# Patient Record
Sex: Male | Born: 1965 | Race: White | Hispanic: No | Marital: Married | Smoking: Former smoker
Health system: Southern US, Community
[De-identification: ages and names within clinical notes are randomized; demographics above are authoritative.]

## PROBLEM LIST (undated history)

## (undated) DIAGNOSIS — N200 Calculus of kidney: Secondary | ICD-10-CM

## (undated) DIAGNOSIS — T8859XA Other complications of anesthesia, initial encounter: Secondary | ICD-10-CM

## (undated) DIAGNOSIS — T4145XA Adverse effect of unspecified anesthetic, initial encounter: Secondary | ICD-10-CM

## (undated) DIAGNOSIS — S069XAA Unspecified intracranial injury with loss of consciousness status unknown, initial encounter: Secondary | ICD-10-CM

## (undated) DIAGNOSIS — M1990 Unspecified osteoarthritis, unspecified site: Secondary | ICD-10-CM

## (undated) DIAGNOSIS — S060X9A Concussion with loss of consciousness of unspecified duration, initial encounter: Secondary | ICD-10-CM

## (undated) DIAGNOSIS — F319 Bipolar disorder, unspecified: Secondary | ICD-10-CM

## (undated) DIAGNOSIS — F32A Depression, unspecified: Secondary | ICD-10-CM

## (undated) DIAGNOSIS — K759 Inflammatory liver disease, unspecified: Secondary | ICD-10-CM

## (undated) DIAGNOSIS — I10 Essential (primary) hypertension: Secondary | ICD-10-CM

## (undated) DIAGNOSIS — R51 Headache: Secondary | ICD-10-CM

## (undated) DIAGNOSIS — Z8489 Family history of other specified conditions: Secondary | ICD-10-CM

## (undated) DIAGNOSIS — F419 Anxiety disorder, unspecified: Secondary | ICD-10-CM

## (undated) DIAGNOSIS — Z9889 Other specified postprocedural states: Secondary | ICD-10-CM

## (undated) DIAGNOSIS — R55 Syncope and collapse: Secondary | ICD-10-CM

## (undated) DIAGNOSIS — S060XAA Concussion with loss of consciousness status unknown, initial encounter: Secondary | ICD-10-CM

## (undated) DIAGNOSIS — S069X9A Unspecified intracranial injury with loss of consciousness of unspecified duration, initial encounter: Secondary | ICD-10-CM

## (undated) DIAGNOSIS — R112 Nausea with vomiting, unspecified: Secondary | ICD-10-CM

## (undated) DIAGNOSIS — R519 Headache, unspecified: Secondary | ICD-10-CM

## (undated) DIAGNOSIS — F329 Major depressive disorder, single episode, unspecified: Secondary | ICD-10-CM

## (undated) DIAGNOSIS — Z87442 Personal history of urinary calculi: Secondary | ICD-10-CM

## (undated) HISTORY — DX: Unspecified intracranial injury with loss of consciousness status unknown, initial encounter: S06.9XAA

## (undated) HISTORY — DX: Bipolar disorder, unspecified: F31.9

## (undated) HISTORY — PX: SP ARTHRO THUMB*L*: HXRAD214

## (undated) HISTORY — PX: LITHOTRIPSY: SUR834

## (undated) HISTORY — DX: Unspecified intracranial injury with loss of consciousness of unspecified duration, initial encounter: S06.9X9A

---

## 1998-09-21 HISTORY — PX: WRIST SURGERY: SHX841

## 2010-11-19 DIAGNOSIS — B182 Chronic viral hepatitis C: Secondary | ICD-10-CM | POA: Insufficient documentation

## 2011-09-22 HISTORY — PX: BACK SURGERY: SHX140

## 2011-09-22 HISTORY — PX: WRIST SURGERY: SHX841

## 2011-10-01 DIAGNOSIS — F1210 Cannabis abuse, uncomplicated: Secondary | ICD-10-CM | POA: Insufficient documentation

## 2012-06-09 ENCOUNTER — Other Ambulatory Visit: Payer: Self-pay | Admitting: Neurosurgery

## 2012-06-13 ENCOUNTER — Encounter (HOSPITAL_COMMUNITY): Payer: Self-pay | Admitting: *Deleted

## 2012-06-13 ENCOUNTER — Encounter (HOSPITAL_COMMUNITY): Payer: Self-pay | Admitting: Pharmacy Technician

## 2012-06-13 MED ORDER — CEFAZOLIN SODIUM-DEXTROSE 2-3 GM-% IV SOLR
2.0000 g | INTRAVENOUS | Status: DC
  Filled 2012-06-13: qty 50

## 2012-06-13 NOTE — Progress Notes (Signed)
Pt had some pre syncope episodes in Sept, His Primary MD-Dr Ramiro Harvest at [facility] did an EKG and had A 2 D Echo done at [facility].  Dr Tera Helper said heart was ok and pain was causing the episodes.  I faxed a request to Dr Kenna Gilbert and [facility] for results.

## 2012-06-14 ENCOUNTER — Ambulatory Visit (HOSPITAL_COMMUNITY): Payer: Medicaid Other | Admitting: Anesthesiology

## 2012-06-14 ENCOUNTER — Encounter (HOSPITAL_COMMUNITY): Payer: Self-pay | Admitting: Anesthesiology

## 2012-06-14 ENCOUNTER — Inpatient Hospital Stay (HOSPITAL_COMMUNITY)
Admission: RE | Admit: 2012-06-14 | Discharge: 2012-06-14 | DRG: 491 | Disposition: A | Discharge status: Home / Self Care | Payer: Medicaid Other | Source: Ambulatory Visit | Attending: Neurosurgery | Admitting: Neurosurgery

## 2012-06-14 ENCOUNTER — Encounter (HOSPITAL_COMMUNITY)
Admission: RE | Disposition: A | Discharge status: Home / Self Care | Payer: Self-pay | Source: Ambulatory Visit | Attending: Neurosurgery

## 2012-06-14 ENCOUNTER — Ambulatory Visit (HOSPITAL_COMMUNITY): Payer: Medicaid Other

## 2012-06-14 DIAGNOSIS — Q762 Congenital spondylolisthesis: Secondary | ICD-10-CM

## 2012-06-14 DIAGNOSIS — B1920 Unspecified viral hepatitis C without hepatic coma: Secondary | ICD-10-CM | POA: Diagnosis present

## 2012-06-14 DIAGNOSIS — M129 Arthropathy, unspecified: Secondary | ICD-10-CM | POA: Diagnosis present

## 2012-06-14 DIAGNOSIS — M5126 Other intervertebral disc displacement, lumbar region: Principal | ICD-10-CM | POA: Diagnosis present

## 2012-06-14 DIAGNOSIS — F319 Bipolar disorder, unspecified: Secondary | ICD-10-CM | POA: Diagnosis present

## 2012-06-14 DIAGNOSIS — Z79899 Other long term (current) drug therapy: Secondary | ICD-10-CM

## 2012-06-14 HISTORY — DX: Concussion with loss of consciousness status unknown, initial encounter: S06.0XAA

## 2012-06-14 HISTORY — DX: Major depressive disorder, single episode, unspecified: F32.9

## 2012-06-14 HISTORY — DX: Adverse effect of unspecified anesthetic, initial encounter: T41.45XA

## 2012-06-14 HISTORY — DX: Other complications of anesthesia, initial encounter: T88.59XA

## 2012-06-14 HISTORY — DX: Syncope and collapse: R55

## 2012-06-14 HISTORY — DX: Depression, unspecified: F32.A

## 2012-06-14 HISTORY — DX: Concussion with loss of consciousness of unspecified duration, initial encounter: S06.0X9A

## 2012-06-14 HISTORY — DX: Unspecified osteoarthritis, unspecified site: M19.90

## 2012-06-14 HISTORY — DX: Family history of other specified conditions: Z84.89

## 2012-06-14 HISTORY — DX: Other specified postprocedural states: R11.2

## 2012-06-14 HISTORY — PX: LUMBAR LAMINECTOMY/DECOMPRESSION MICRODISCECTOMY: SHX5026

## 2012-06-14 HISTORY — DX: Calculus of kidney: N20.0

## 2012-06-14 HISTORY — DX: Other specified postprocedural states: Z98.890

## 2012-06-14 HISTORY — DX: Inflammatory liver disease, unspecified: K75.9

## 2012-06-14 LAB — CBC
HCT: 39.3 % (ref 39.0–52.0)
Hemoglobin: 14.3 g/dL (ref 13.0–17.0)
RBC: 4.56 MIL/uL (ref 4.22–5.81)
WBC: 7.6 10*3/uL (ref 4.0–10.5)

## 2012-06-14 LAB — COMPREHENSIVE METABOLIC PANEL WITH GFR
ALT: 85 U/L — ABNORMAL HIGH (ref 0–53)
AST: 57 U/L — ABNORMAL HIGH (ref 0–37)
Albumin: 3.9 g/dL (ref 3.5–5.2)
Alkaline Phosphatase: 59 U/L (ref 39–117)
BUN: 13 mg/dL (ref 6–23)
CO2: 30 meq/L (ref 19–32)
Calcium: 9.7 mg/dL (ref 8.4–10.5)
Chloride: 101 meq/L (ref 96–112)
Creatinine, Ser: 0.82 mg/dL (ref 0.50–1.35)
GFR calc Af Amer: >90 mL/min
GFR calc non Af Amer: >90 mL/min
Glucose, Bld: 96 mg/dL (ref 70–99)
Potassium: 3.6 meq/L (ref 3.5–5.1)
Sodium: 137 meq/L (ref 135–145)
Total Bilirubin: 0.3 mg/dL (ref 0.3–1.2)
Total Protein: 6.9 g/dL (ref 6.0–8.3)

## 2012-06-14 LAB — SURGICAL PCR SCREEN
MRSA, PCR: NEGATIVE
Staphylococcus aureus: NEGATIVE

## 2012-06-14 SURGERY — LUMBAR LAMINECTOMY/DECOMPRESSION MICRODISCECTOMY 1 LEVEL
Anesthesia: General | Site: Back | Laterality: Left | Wound class: Clean

## 2012-06-14 MED ORDER — HYDROMORPHONE HCL PF 1 MG/ML IJ SOLN
0.2500 mg | INTRAMUSCULAR | Status: DC | PRN
  Administered 2012-06-14 (×4): 0.5 mg via INTRAVENOUS

## 2012-06-14 MED ORDER — SODIUM CHLORIDE 0.9 % IV SOLN: INTRAVENOUS | Status: DC

## 2012-06-14 MED ORDER — PROPOFOL 10 MG/ML IV BOLUS
INTRAVENOUS | Status: DC | PRN
  Administered 2012-06-14: 200 mg via INTRAVENOUS

## 2012-06-14 MED ORDER — DIAZEPAM 5 MG PO TABS
ORAL_TABLET | ORAL | Status: AC
  Filled 2012-06-14: qty 1

## 2012-06-14 MED ORDER — FENTANYL CITRATE 0.05 MG/ML IJ SOLN
INTRAMUSCULAR | Status: DC | PRN
  Administered 2012-06-14: 50 ug via INTRAVENOUS
  Administered 2012-06-14 (×4): 100 ug via INTRAVENOUS
  Administered 2012-06-14: 50 ug via INTRAVENOUS

## 2012-06-14 MED ORDER — ACETAMINOPHEN 650 MG RE SUPP: 650.0000 mg | RECTAL | Status: DC | PRN

## 2012-06-14 MED ORDER — SODIUM CHLORIDE 0.9 % IJ SOLN: 3.0000 mL | INTRAMUSCULAR | Status: DC | PRN

## 2012-06-14 MED ORDER — HYDROMORPHONE HCL PF 1 MG/ML IJ SOLN
1.0000 mg | INTRAMUSCULAR | Status: AC | PRN
  Administered 2012-06-14: 1 mg via INTRAVENOUS

## 2012-06-14 MED ORDER — ZOLPIDEM TARTRATE 5 MG PO TABS: 10.0000 mg | ORAL_TABLET | Freq: Every evening | ORAL | Status: DC | PRN

## 2012-06-14 MED ORDER — MIDAZOLAM HCL 5 MG/5ML IJ SOLN
INTRAMUSCULAR | Status: DC | PRN
  Administered 2012-06-14: 2 mg via INTRAVENOUS

## 2012-06-14 MED ORDER — HYDROMORPHONE HCL PF 1 MG/ML IJ SOLN
INTRAMUSCULAR | Status: AC
  Filled 2012-06-14: qty 1

## 2012-06-14 MED ORDER — LACTATED RINGERS IV SOLN
INTRAVENOUS | Status: DC | PRN
  Administered 2012-06-14 (×2): via INTRAVENOUS

## 2012-06-14 MED ORDER — OXYCODONE-ACETAMINOPHEN 5-325 MG PO TABS
1.0000 | ORAL_TABLET | ORAL | Status: DC | PRN
  Administered 2012-06-14: 2 via ORAL
  Filled 2012-06-14: qty 2

## 2012-06-14 MED ORDER — OXCARBAZEPINE 300 MG PO TABS
300.0000 mg | ORAL_TABLET | Freq: Every evening | ORAL | Status: DC
Start: 2012-06-14 — End: 2012-06-14
  Filled 2012-06-14: qty 1

## 2012-06-14 MED ORDER — ROCURONIUM BROMIDE 100 MG/10ML IV SOLN
INTRAVENOUS | Status: DC | PRN
  Administered 2012-06-14: 50 mg via INTRAVENOUS

## 2012-06-14 MED ORDER — DIAZEPAM 5 MG PO TABS
5.0000 mg | ORAL_TABLET | Freq: Four times a day (QID) | ORAL | Status: DC | PRN
  Administered 2012-06-14: 5 mg via ORAL

## 2012-06-14 MED ORDER — MENTHOL 3 MG MT LOZG: 1.0000 | LOZENGE | OROMUCOSAL | Status: DC | PRN

## 2012-06-14 MED ORDER — PHENOL 1.4 % MT LIQD: 1.0000 | OROMUCOSAL | Status: DC | PRN

## 2012-06-14 MED ORDER — MUPIROCIN 2 % EX OINT
TOPICAL_OINTMENT | Freq: Two times a day (BID) | CUTANEOUS | Status: DC
  Filled 2012-06-14: qty 22

## 2012-06-14 MED ORDER — CEFAZOLIN SODIUM 1-5 GM-% IV SOLN
1.0000 g | Freq: Three times a day (TID) | INTRAVENOUS | Status: DC
  Filled 2012-06-14 (×2): qty 50

## 2012-06-14 MED ORDER — 0.9 % SODIUM CHLORIDE (POUR BTL) OPTIME
TOPICAL | Status: DC | PRN
  Administered 2012-06-14: 1000 mL

## 2012-06-14 MED ORDER — ONDANSETRON HCL 4 MG/2ML IJ SOLN: 4.0000 mg | INTRAMUSCULAR | Status: DC | PRN

## 2012-06-14 MED ORDER — HEMOSTATIC AGENTS (NO CHARGE) OPTIME
TOPICAL | Status: DC | PRN
  Administered 2012-06-14: 1 via TOPICAL

## 2012-06-14 MED ORDER — CEFAZOLIN SODIUM-DEXTROSE 2-3 GM-% IV SOLR
INTRAVENOUS | Status: DC | PRN
  Administered 2012-06-14: 2 g via INTRAVENOUS

## 2012-06-14 MED ORDER — THROMBIN 5000 UNITS EX SOLR
CUTANEOUS | Status: DC | PRN
  Administered 2012-06-14 (×2): 5000 [IU] via TOPICAL

## 2012-06-14 MED ORDER — ACETAMINOPHEN 10 MG/ML IV SOLN
INTRAVENOUS | Status: AC
  Administered 2012-06-14: 1000 mg via INTRAVENOUS
  Filled 2012-06-14: qty 100

## 2012-06-14 MED ORDER — LIDOCAINE HCL 4 % MT SOLN
OROMUCOSAL | Status: DC | PRN
  Administered 2012-06-14: 4 mL via TOPICAL

## 2012-06-14 MED ORDER — ACETAMINOPHEN 325 MG PO TABS: 650.0000 mg | ORAL_TABLET | ORAL | Status: DC | PRN

## 2012-06-14 MED ORDER — ONDANSETRON HCL 4 MG/2ML IJ SOLN: 4.0000 mg | Freq: Four times a day (QID) | INTRAMUSCULAR | Status: DC | PRN

## 2012-06-14 MED ORDER — LIDOCAINE HCL (CARDIAC) 20 MG/ML IV SOLN
INTRAVENOUS | Status: DC | PRN
  Administered 2012-06-14: 60 mg via INTRAVENOUS

## 2012-06-14 MED ORDER — SODIUM CHLORIDE 0.9 % IJ SOLN: 3.0000 mL | Freq: Two times a day (BID) | INTRAMUSCULAR | Status: DC

## 2012-06-14 MED ORDER — GLYCOPYRROLATE 0.2 MG/ML IJ SOLN
INTRAMUSCULAR | Status: DC | PRN
  Administered 2012-06-14: 0.6 mg via INTRAVENOUS

## 2012-06-14 MED ORDER — MORPHINE SULFATE 4 MG/ML IJ SOLN
4.0000 mg | INTRAMUSCULAR | Status: DC | PRN
  Administered 2012-06-14: 4 mg via INTRAVENOUS
  Filled 2012-06-14: qty 1

## 2012-06-14 MED ORDER — ONDANSETRON HCL 4 MG/2ML IJ SOLN
INTRAMUSCULAR | Status: DC | PRN
  Administered 2012-06-14: 4 mg via INTRAVENOUS

## 2012-06-14 MED ORDER — SODIUM CHLORIDE 0.9 % IV SOLN: 250.0000 mL | INTRAVENOUS | Status: DC

## 2012-06-14 MED ORDER — NEOSTIGMINE METHYLSULFATE 1 MG/ML IJ SOLN
INTRAMUSCULAR | Status: DC | PRN
  Administered 2012-06-14: 4 mg via INTRAVENOUS

## 2012-06-14 SURGICAL SUPPLY — 53 items
BENZOIN TINCTURE PRP APPL 2/3 (GAUZE/BANDAGES/DRESSINGS) ×2 IMPLANT
BLADE SURG ROTATE 9660 (MISCELLANEOUS) IMPLANT
BUR ACORN 6.0 (BURR) ×2 IMPLANT
BUR MATCHSTICK NEURO 3.0 LAGG (BURR) ×2 IMPLANT
CANISTER SUCTION 2500CC (MISCELLANEOUS) ×2 IMPLANT
CLOTH BEACON ORANGE TIMEOUT ST (SAFETY) ×2 IMPLANT
CONT SPEC 4OZ CLIKSEAL STRL BL (MISCELLANEOUS) ×2 IMPLANT
DRAPE LAPAROTOMY 100X72X124 (DRAPES) ×2 IMPLANT
DRAPE MICROSCOPE LEICA (MISCELLANEOUS) ×2 IMPLANT
DRAPE POUCH INSTRU U-SHP 10X18 (DRAPES) ×2 IMPLANT
DRSG PAD ABDOMINAL 8X10 ST (GAUZE/BANDAGES/DRESSINGS) IMPLANT
DURAPREP 26ML APPLICATOR (WOUND CARE) ×2 IMPLANT
ELECT REM PT RETURN 9FT ADLT (ELECTROSURGICAL) ×2
ELECTRODE REM PT RTRN 9FT ADLT (ELECTROSURGICAL) ×1 IMPLANT
GAUZE SPONGE 4X4 16PLY XRAY LF (GAUZE/BANDAGES/DRESSINGS) IMPLANT
GLOVE BIOGEL M 8.0 STRL (GLOVE) ×2 IMPLANT
GLOVE BIOGEL PI IND STRL 7.0 (GLOVE) ×1 IMPLANT
GLOVE BIOGEL PI INDICATOR 7.0 (GLOVE) ×1
GLOVE ECLIPSE 7.5 STRL STRAW (GLOVE) ×2 IMPLANT
GLOVE EXAM NITRILE LRG STRL (GLOVE) IMPLANT
GLOVE EXAM NITRILE MD LF STRL (GLOVE) IMPLANT
GLOVE EXAM NITRILE XL STR (GLOVE) IMPLANT
GLOVE EXAM NITRILE XS STR PU (GLOVE) IMPLANT
GLOVE SURG SS PI 6.5 STRL IVOR (GLOVE) ×6 IMPLANT
GOWN BRE IMP SLV AUR LG STRL (GOWN DISPOSABLE) ×4 IMPLANT
GOWN BRE IMP SLV AUR XL STRL (GOWN DISPOSABLE) ×2 IMPLANT
GOWN STRL REIN 2XL LVL4 (GOWN DISPOSABLE) IMPLANT
KIT BASIN OR (CUSTOM PROCEDURE TRAY) ×2 IMPLANT
KIT ROOM TURNOVER OR (KITS) ×2 IMPLANT
NEEDLE HYPO 18GX1.5 BLUNT FILL (NEEDLE) IMPLANT
NEEDLE HYPO 21X1.5 SAFETY (NEEDLE) IMPLANT
NEEDLE HYPO 25X1 1.5 SAFETY (NEEDLE) ×2 IMPLANT
NEEDLE SPNL 20GX3.5 QUINCKE YW (NEEDLE) ×2 IMPLANT
NS IRRIG 1000ML POUR BTL (IV SOLUTION) ×2 IMPLANT
PACK LAMINECTOMY NEURO (CUSTOM PROCEDURE TRAY) ×2 IMPLANT
PAD ARMBOARD 7.5X6 YLW CONV (MISCELLANEOUS) ×6 IMPLANT
PATTIES SURGICAL .5 X1 (DISPOSABLE) ×2 IMPLANT
RUBBERBAND STERILE (MISCELLANEOUS) ×4 IMPLANT
SPONGE GAUZE 4X4 12PLY (GAUZE/BANDAGES/DRESSINGS) ×2 IMPLANT
SPONGE LAP 4X18 X RAY DECT (DISPOSABLE) IMPLANT
SPONGE SURGIFOAM ABS GEL SZ50 (HEMOSTASIS) ×2 IMPLANT
STRIP CLOSURE SKIN 1/2X4 (GAUZE/BANDAGES/DRESSINGS) ×2 IMPLANT
SUT VIC AB 0 CT1 18XCR BRD8 (SUTURE) ×1 IMPLANT
SUT VIC AB 0 CT1 8-18 (SUTURE) ×1
SUT VIC AB 2-0 CP2 18 (SUTURE) ×2 IMPLANT
SUT VIC AB 3-0 SH 8-18 (SUTURE) ×2 IMPLANT
SYR 20CC LL (SYRINGE) IMPLANT
SYR 20ML ECCENTRIC (SYRINGE) ×2 IMPLANT
SYR 5ML LL (SYRINGE) IMPLANT
TAPE CLOTH SURG 4X10 WHT LF (GAUZE/BANDAGES/DRESSINGS) ×2 IMPLANT
TOWEL OR 17X24 6PK STRL BLUE (TOWEL DISPOSABLE) ×2 IMPLANT
TOWEL OR 17X26 10 PK STRL BLUE (TOWEL DISPOSABLE) ×2 IMPLANT
WATER STERILE IRR 1000ML POUR (IV SOLUTION) ×2 IMPLANT

## 2012-06-14 NOTE — Preoperative (Signed)
Beta Blockers   Reason not to administer Beta Blockers:Not Applicable 

## 2012-06-14 NOTE — Anesthesia Preprocedure Evaluation (Signed)
Anesthesia Evaluation  Patient identified by MRN, date of birth, ID band Patient awake    Reviewed: Allergy & Precautions, H&P , NPO status , Patient's Chart, lab work & pertinent test results  History of Anesthesia Complications (+) PONV  Airway Mallampati: II  Neck ROM: full    Dental   Pulmonary          Cardiovascular     Neuro/Psych Bipolar Disorder    GI/Hepatic (+) Hepatitis -, C  Endo/Other    Renal/GU      Musculoskeletal  (+) Arthritis -,   Abdominal   Peds  Hematology   Anesthesia Other Findings   Reproductive/Obstetrics                           Anesthesia Physical Anesthesia Plan  ASA: II  Anesthesia Plan: General   Post-op Pain Management:    Induction: Intravenous  Airway Management Planned: Oral ETT  Additional Equipment:   Intra-op Plan:   Post-operative Plan: Extubation in OR  Informed Consent: I have reviewed the patients History and Physical, chart, labs and discussed the procedure including the risks, benefits and alternatives for the proposed anesthesia with the patient or authorized representative who has indicated his/her understanding and acceptance.     Plan Discussed with: CRNA and Surgeon  Anesthesia Plan Comments:         Anesthesia Quick Evaluation

## 2012-06-14 NOTE — Progress Notes (Signed)
Pt doing well. Pt is OOB ambulating independently and his pain is controlled. Pt given D/C instructions with verbal understanding. Pt D/C'd home via wheelchair @ 1515 per MD order. Rema Fendt, RN

## 2012-06-14 NOTE — Anesthesia Postprocedure Evaluation (Signed)
Anesthesia Post Note  Patient: Corey Gilbert  Procedure(s) Performed: Procedure(s) (LRB): LUMBAR LAMINECTOMY/DECOMPRESSION MICRODISCECTOMY 1 LEVEL (Left)  Anesthesia type: General  Patient location: PACU  Post pain: Pain level controlled and Adequate analgesia  Post assessment: Post-op Vital signs reviewed, Patient's Cardiovascular Status Stable, Respiratory Function Stable, Patent Airway and Pain level controlled  Last Vitals:  Filed Vitals:   06/14/12 1012  BP:   Pulse: 69  Temp:   Resp: 19    Post vital signs: Reviewed and stable  Level of consciousness: awake, alert  and oriented  Complications: No apparent anesthesia complications

## 2012-06-14 NOTE — Transfer of Care (Signed)
Immediate Anesthesia Transfer of Care Note  Patient: Corey Gilbert  Procedure(s) Performed: Procedure(s) (LRB) with comments: LUMBAR LAMINECTOMY/DECOMPRESSION MICRODISCECTOMY 1 LEVEL (Left) - Left Lumbar Removal of Fragment/Laminectomy Lumbar Three-Four  Patient Location: PACU  Anesthesia Type: General  Level of Consciousness: awake, alert , oriented and patient cooperative  Airway & Oxygen Therapy: Patient Spontanous Breathing and Patient connected to nasal cannula oxygen  Post-op Assessment: Report given to PACU RN, Post -op Vital signs reviewed and stable and Patient moving all extremities X 4  Post vital signs: Reviewed and stable  Complications: No apparent anesthesia complications

## 2012-06-14 NOTE — Progress Notes (Signed)
Op note [phone]

## 2012-06-14 NOTE — H&P (Signed)
Corey Gilbert is an 46 y.o. male.   Chief Complaint: left leg pain HPI: lbp with radiation to the left leg associated with weakness. He has been seen by 2 surgeons and was advised about surgery     Past Medical History  Diagnosis Date  . Mental disorder     Bi- Polar  . Complication of anesthesia   . PONV (postoperative nausea and vomiting)   . Family history of anesthesia complication   . Near syncope     due to opain, last time  early Sept 2013  . Depression   . Hepatitis     Hepatitis C  . Concussion   . Kidney stones   . Arthritis     Past Surgical History  Procedure Date  . Wrist surgery 2013    left at [facility]  . Wrist surgery 2000    right    History reviewed. No pertinent family history. Social History:  reports that he has never smoked. He does not have any smokeless tobacco history on file. He reports that he uses illicit drugs (Marijuana). He reports that he does not drink alcohol.  Allergies:  Allergies  Allergen Reactions  . Toradol (Ketorolac Tromethamine) Other (See Comments)    Reacts with other medications    Medications Prior to Admission  Medication Sig Dispense Refill  . HYDROmorphone (DILAUDID) 4 MG tablet Take 4 mg by mouth every 4 (four) hours as needed. For pain      . Oxcarbazepine (TRILEPTAL) 300 MG tablet Take 300 mg by mouth every evening.      Marland Kitchen oxyCODONE (OXYCONTIN) 40 MG 12 hr tablet Take 40 mg by mouth 2 (two) times daily as needed. For pain        Results for orders placed during the hospital encounter of 06/14/12 (from the past 48 hour(s))  COMPREHENSIVE METABOLIC PANEL     Status: Abnormal   Collection Time   06/14/12  6:16 AM      Component Value Range Comment   Sodium 137  135 - 145 mEq/L    Potassium 3.6  3.5 - 5.1 mEq/L    Chloride 101  96 - 112 mEq/L    CO2 30  19 - 32 mEq/L    Glucose, Bld 96  70 - 99 mg/dL    BUN 13  6 - 23 mg/dL    Creatinine, Ser 4.09  0.50 - 1.35 mg/dL    Calcium 9.7  8.4 - 81.1 mg/dL    Total  Protein 6.9  6.0 - 8.3 g/dL    Albumin 3.9  3.5 - 5.2 g/dL    AST 57 (*) 0 - 37 U/L    ALT 85 (*) 0 - 53 U/L    Alkaline Phosphatase 59  39 - 117 U/L    Total Bilirubin 0.3  0.3 - 1.2 mg/dL    GFR calc non Af Amer >90  >90 mL/min    GFR calc Af Amer >90  >90 mL/min   CBC     Status: Abnormal   Collection Time   06/14/12  6:16 AM      Component Value Range Comment   WBC 7.6  4.0 - 10.5 K/uL    RBC 4.56  4.22 - 5.81 MIL/uL    Hemoglobin 14.3  13.0 - 17.0 g/dL    HCT 91.4  78.2 - 95.6 %    MCV 86.2  78.0 - 100.0 fL    MCH 31.4  26.0 - 34.0 pg  MCHC 36.4 (*) 30.0 - 36.0 g/dL    RDW 16.1  09.6 - 04.5 %    Platelets 124 (*) 150 - 400 K/uL    No results found.  Review of Systems  Constitutional: Negative.   HENT: Negative.   Eyes: Negative.   Respiratory: Negative.   Cardiovascular: Negative.   Gastrointestinal: Negative.   Genitourinary: Negative.   Musculoskeletal: Positive for back pain.  Skin: Negative.   Neurological: Positive for sensory change and focal weakness.  Endo/Heme/Allergies: Negative.   Psychiatric/Behavioral: Negative.     Blood pressure 144/94, pulse 54, temperature 98 F (36.7 C), temperature source Oral, resp. rate 20, height 5\' 10"  (1.778 m), weight 77.111 kg (170 lb), SpO2 98.00%. Physical Exam patient who came to my office with his wife, limping from the left leg. Hent,nl. Neck,nl lungs,clear, cv, nl. Abdomen,nl. Extremities, nl. NEURO 2/5 weakness of left foot, 4/5 quadriceps. Numbness of dorsal left foot. Marland Kitchen MRI lumbar left l3-4 hnp. Grade 1 l5s1 spondylolisthesis.  Assessment/Plan  for left l3-4 discectomy. Aware of risks and benefits.  Onis Markoff M 06/14/2012, 7:46 AM

## 2012-06-15 ENCOUNTER — Encounter (HOSPITAL_COMMUNITY): Payer: Self-pay | Admitting: Neurosurgery

## 2012-06-15 NOTE — Op Note (Signed)
NAMEMarland Kitchen  KESHAN, TUREAUD NO.:  [id number]  MEDICAL RECORD NO.:  [id number]  LOCATION:  3524                         FACILITY:  [facility]  PHYSICIAN:  Hilda Lias, M.D.   DATE OF BIRTH:  05-02-1966  DATE OF PROCEDURE:  06/14/2012 DATE OF DISCHARGE:  06/14/2012                              OPERATIVE REPORT   PREOPERATIVE DIAGNOSES:  Left L3-L4 herniated disk with fragment, weakness of the left foot, incidental spondylolisthesis, L5-S1.  POSTOPERATIVE DIAGNOSES:  Left L3-L4 herniated disk with fragment, weakness of the left foot, incidental spondylolisthesis, L5-S1.  PROCEDURE:  Left L3-L4, removal of 3 pieces of fragment, foraminotomy to decompress the L3 and L4 nerve root.  Microscope.  SURGEON:  Hilda Lias, MD.  ASSISTANT:  Clydene Fake, MD  CLINICAL HISTORY:  The patient was in my office, complaining of back and left leg pain associated with weakness of the dorsiflexion of the left foot at the quadriceps.  The patient had been seen by 2 different physicians.  The MRI showed that he has a fragment of the L3-4 and grade 1 incidental spondylolisthesis, L5-S1.  Surgery was advised.  He and his wife knew the risk of the surgery including the possibility of no improvement.  PROCEDURE:  The patient was taken to the OR, and after intubation, he was positioned in a prone manner.  The back was cleaned with DuraPrep. Drapes were applied.  It was difficult to feel the spinous process, so x- rays showed that indeed we were right at the level of L3-L4.  Then, incision was made in the midline and retraction was done to the left side.  Another x-ray showed that the clip was at the level of the spinous process 3 at the lower one of the level 4.  Then, we brought the microscope into the area and we did a laminotomy at the L3 and L4.  The yellow ligament was also excised.  We went ahead and we looked at the disk space of the level of 3-4.  The disk was slightly bulging,  but there was no evidence of any opening.  Immediately right on the dorsal aspect of the L4 nerve root, there were like 3 fragments of disk going into the foramen.  Removal was done.  We investigated below the nerve at midline and there was no more evidence of any herniated disk.  Because there was open in the disk, we decided not to do diskectomy.  Hemostasis was done with bipolar and the wound was closed with Vicryl and Steri- Strips.         ______________________________ Hilda Lias, M.D.    EB/MEDQ  D:  06/14/2012  T:  06/15/2012  Job:  [id number]

## 2012-06-15 NOTE — Discharge Summary (Signed)
Physician Discharge Summary  Patient ID: Ramonte Mena MRN: [id number] DOB/AGE: 12/02/1965 46 y.o.  Admit date: 06/14/2012 Discharge date: 06/15/2012  Admission Diagnoses:RIGHT L34 HNP  Discharge Diagnoses: SAME   Discharged Condition: no apin  Hospital Course:surgery  Consults: no  Significant Diagnostic Studies: mri  Treatments: surgery  Discharge Exam: Blood pressure 155/99, pulse 73, temperature 98 F (36.7 C), temperature source Oral, resp. rate 18, height 5\' 10"  (1.778 m), weight 77.111 kg (170 lb), SpO2 96.00%. No weakness  Disposition: home     Medication List     As of 06/15/2012 11:04 AM    ASK your doctor about these medications         HYDROmorphone 4 MG tablet   Commonly known as: DILAUDID   Take 4 mg by mouth every 4 (four) hours as needed. For pain      Oxcarbazepine 300 MG tablet   Commonly known as: TRILEPTAL   Take 300 mg by mouth every evening.      oxyCODONE 40 MG 12 hr tablet   Commonly known as: OXYCONTIN   Take 40 mg by mouth 2 (two) times daily as needed. For pain         Signed: Karn Cassis 06/15/2012, 11:04 AM

## 2012-09-07 ENCOUNTER — Other Ambulatory Visit: Payer: Self-pay | Admitting: Neurosurgery

## 2012-09-12 ENCOUNTER — Encounter (HOSPITAL_COMMUNITY): Payer: Self-pay | Admitting: Pharmacy Technician

## 2012-09-17 NOTE — Pre-Procedure Instructions (Signed)
20 Corey Gilbert  09/17/2012   Your procedure is scheduled on:  Tues, Jan 7 @ 10:00 AM  Report to [facility] at 7:00 AM.  Call this number if you have problems the morning of surgery: [phone]   Remember:   Do not eat food:After Midnight.  Take these medicines the morning of surgery with A SIP OF WATER: Pain Pill(if needed)   Do not wear jewelry.  Do not wear lotions, powders, or colognes. You may wear deodorant.  Men may shave face and neck.  Do not bring valuables to the hospital.  Contacts, dentures or bridgework may not be worn into surgery.  Leave suitcase in the car. After surgery it may be brought to your room.  For patients admitted to the hospital, checkout time is 11:00 AM the day of discharge.   Patients discharged the day of surgery will not be allowed to drive home.    Special Instructions: Shower using CHG 2 nights before surgery and the night before surgery.  If you shower the day of surgery use CHG.  Use special wash - you have one bottle of CHG for all showers.  You should use approximately 1/3 of the bottle for each shower.   Please read over the following fact sheets that you were given: Pain Booklet, Coughing and Deep Breathing, MRSA Information and Surgical Site Infection Prevention

## 2012-09-19 ENCOUNTER — Encounter (HOSPITAL_COMMUNITY)
Admission: RE | Admit: 2012-09-19 | Discharge: 2012-09-19 | Disposition: A | Discharge status: Home / Self Care | Payer: Medicaid Other | Source: Ambulatory Visit | Attending: Neurosurgery | Admitting: Neurosurgery

## 2012-09-19 ENCOUNTER — Encounter (HOSPITAL_COMMUNITY): Payer: Self-pay

## 2012-09-19 LAB — CBC
MCH: 30.2 pg (ref 26.0–34.0)
MCHC: 35.1 g/dL (ref 30.0–36.0)
Platelets: 158 10*3/uL (ref 150–400)
RBC: 5.56 MIL/uL (ref 4.22–5.81)

## 2012-09-19 LAB — SURGICAL PCR SCREEN
MRSA, PCR: NEGATIVE
Staphylococcus aureus: POSITIVE — AB

## 2012-09-19 MED ORDER — CEFAZOLIN SODIUM-DEXTROSE 2-3 GM-% IV SOLR: 2.0000 g | INTRAVENOUS | Status: DC

## 2012-09-19 NOTE — Progress Notes (Signed)
Pt called and advised that he had positive nasal cx for  SA.Marland Kitchenand advised to go to drugstore and get Mupiricin ointment Use 5 days x 2 each day to total 10 doses.

## 2012-09-27 ENCOUNTER — Ambulatory Visit (HOSPITAL_COMMUNITY): Payer: Medicaid Other | Admitting: Anesthesiology

## 2012-09-27 ENCOUNTER — Inpatient Hospital Stay (HOSPITAL_COMMUNITY)
Admission: RE | Admit: 2012-09-27 | Discharge: 2012-09-29 | DRG: 472 | Disposition: A | Discharge status: Home / Self Care | Payer: Medicaid Other | Source: Ambulatory Visit | Attending: Neurosurgery | Admitting: Neurosurgery

## 2012-09-27 ENCOUNTER — Encounter (HOSPITAL_COMMUNITY): Payer: Self-pay | Admitting: Anesthesiology

## 2012-09-27 ENCOUNTER — Inpatient Hospital Stay (HOSPITAL_COMMUNITY): Payer: Medicaid Other

## 2012-09-27 ENCOUNTER — Encounter (HOSPITAL_COMMUNITY)
Admission: RE | Disposition: A | Discharge status: Home / Self Care | Payer: Self-pay | Source: Ambulatory Visit | Attending: Neurosurgery

## 2012-09-27 ENCOUNTER — Encounter (HOSPITAL_COMMUNITY): Payer: Self-pay | Admitting: *Deleted

## 2012-09-27 DIAGNOSIS — M4802 Spinal stenosis, cervical region: Principal | ICD-10-CM | POA: Diagnosis present

## 2012-09-27 DIAGNOSIS — M129 Arthropathy, unspecified: Secondary | ICD-10-CM | POA: Diagnosis present

## 2012-09-27 DIAGNOSIS — B1920 Unspecified viral hepatitis C without hepatic coma: Secondary | ICD-10-CM | POA: Diagnosis present

## 2012-09-27 DIAGNOSIS — F3130 Bipolar disorder, current episode depressed, mild or moderate severity, unspecified: Secondary | ICD-10-CM | POA: Diagnosis present

## 2012-09-27 DIAGNOSIS — Z01812 Encounter for preprocedural laboratory examination: Secondary | ICD-10-CM

## 2012-09-27 HISTORY — PX: ANTERIOR CERVICAL DECOMP/DISCECTOMY FUSION: SHX1161

## 2012-09-27 SURGERY — ANTERIOR CERVICAL DECOMPRESSION/DISCECTOMY FUSION 2 LEVELS
Anesthesia: General | Site: Neck | Wound class: Clean

## 2012-09-27 MED ORDER — THROMBIN 5000 UNITS EX SOLR
CUTANEOUS | Status: DC | PRN
  Administered 2012-09-27 (×3): 5000 [IU] via TOPICAL

## 2012-09-27 MED ORDER — CEFAZOLIN SODIUM 1-5 GM-% IV SOLN
1.0000 g | Freq: Three times a day (TID) | INTRAVENOUS | Status: AC
  Administered 2012-09-27 (×2): 1 g via INTRAVENOUS
  Filled 2012-09-27 (×2): qty 50

## 2012-09-27 MED ORDER — HYDROMORPHONE HCL PF 1 MG/ML IJ SOLN
1.0000 mg | Freq: Once | INTRAMUSCULAR | Status: AC
  Administered 2012-09-27: 1 mg via INTRAVENOUS

## 2012-09-27 MED ORDER — ARTIFICIAL TEARS OP OINT
TOPICAL_OINTMENT | OPHTHALMIC | Status: DC | PRN
  Administered 2012-09-27: 1 via OPHTHALMIC

## 2012-09-27 MED ORDER — SUFENTANIL CITRATE 50 MCG/ML IV SOLN
INTRAVENOUS | Status: DC | PRN
  Administered 2012-09-27 (×2): 10 ug via INTRAVENOUS
  Administered 2012-09-27 (×3): 5 ug via INTRAVENOUS
  Administered 2012-09-27: 10 ug via INTRAVENOUS
  Administered 2012-09-27: 5 ug via INTRAVENOUS
  Administered 2012-09-27 (×2): 10 ug via INTRAVENOUS
  Administered 2012-09-27 (×2): 5 ug via INTRAVENOUS
  Administered 2012-09-27: 10 ug via INTRAVENOUS

## 2012-09-27 MED ORDER — MIDAZOLAM HCL 5 MG/5ML IJ SOLN
INTRAMUSCULAR | Status: DC | PRN
  Administered 2012-09-27: 2 mg via INTRAVENOUS

## 2012-09-27 MED ORDER — HYDROMORPHONE 0.3 MG/ML IV SOLN
INTRAVENOUS | Status: DC
  Administered 2012-09-27: 2.4 mg via INTRAVENOUS
  Administered 2012-09-27: 3.6 mg via INTRAVENOUS
  Administered 2012-09-27: 2.9 mg via INTRAVENOUS
  Filled 2012-09-27 (×2): qty 25

## 2012-09-27 MED ORDER — MIDAZOLAM HCL 2 MG/2ML IJ SOLN
INTRAMUSCULAR | Status: AC
  Filled 2012-09-27: qty 2

## 2012-09-27 MED ORDER — DIPHENHYDRAMINE HCL 50 MG/ML IJ SOLN: 12.5000 mg | Freq: Four times a day (QID) | INTRAMUSCULAR | Status: DC | PRN

## 2012-09-27 MED ORDER — MENTHOL 3 MG MT LOZG
1.0000 | LOZENGE | OROMUCOSAL | Status: DC | PRN
  Filled 2012-09-27: qty 9

## 2012-09-27 MED ORDER — HYDROMORPHONE HCL PF 1 MG/ML IJ SOLN
0.2500 mg | INTRAMUSCULAR | Status: DC | PRN
  Administered 2012-09-27 (×4): 0.5 mg via INTRAVENOUS

## 2012-09-27 MED ORDER — HYDROMORPHONE HCL PF 1 MG/ML IJ SOLN
INTRAMUSCULAR | Status: AC
  Filled 2012-09-27: qty 1

## 2012-09-27 MED ORDER — NALOXONE HCL 0.4 MG/ML IJ SOLN: 0.4000 mg | INTRAMUSCULAR | Status: DC | PRN

## 2012-09-27 MED ORDER — DEXAMETHASONE SODIUM PHOSPHATE 4 MG/ML IJ SOLN
4.0000 mg | Freq: Four times a day (QID) | INTRAMUSCULAR | Status: DC
  Administered 2012-09-27 (×2): 4 mg via INTRAVENOUS
  Filled 2012-09-27 (×10): qty 1

## 2012-09-27 MED ORDER — METOCLOPRAMIDE HCL 5 MG/ML IJ SOLN: 10.0000 mg | Freq: Once | INTRAMUSCULAR | Status: DC | PRN

## 2012-09-27 MED ORDER — PHENOL 1.4 % MT LIQD: 1.0000 | OROMUCOSAL | Status: DC | PRN

## 2012-09-27 MED ORDER — ONDANSETRON HCL 4 MG/2ML IJ SOLN
INTRAMUSCULAR | Status: DC | PRN
  Administered 2012-09-27: 4 mg via INTRAVENOUS

## 2012-09-27 MED ORDER — ONDANSETRON HCL 4 MG/2ML IJ SOLN: 4.0000 mg | INTRAMUSCULAR | Status: DC | PRN

## 2012-09-27 MED ORDER — DIAZEPAM 5 MG PO TABS
5.0000 mg | ORAL_TABLET | Freq: Four times a day (QID) | ORAL | Status: DC | PRN
  Administered 2012-09-27 – 2012-09-29 (×5): 5 mg via ORAL
  Filled 2012-09-27 (×4): qty 1

## 2012-09-27 MED ORDER — DIPHENHYDRAMINE HCL 12.5 MG/5ML PO ELIX: 12.5000 mg | ORAL_SOLUTION | Freq: Four times a day (QID) | ORAL | Status: DC | PRN

## 2012-09-27 MED ORDER — HEMOSTATIC AGENTS (NO CHARGE) OPTIME
TOPICAL | Status: DC | PRN
  Administered 2012-09-27: 1 via TOPICAL

## 2012-09-27 MED ORDER — DIPHENHYDRAMINE HCL 12.5 MG/5ML PO ELIX
12.5000 mg | ORAL_SOLUTION | Freq: Four times a day (QID) | ORAL | Status: DC | PRN
  Filled 2012-09-27: qty 5

## 2012-09-27 MED ORDER — MORPHINE SULFATE 2 MG/ML IJ SOLN
1.0000 mg | INTRAMUSCULAR | Status: DC | PRN
  Administered 2012-09-27: 4 mg via INTRAVENOUS
  Filled 2012-09-27: qty 2

## 2012-09-27 MED ORDER — SODIUM CHLORIDE 0.9 % IJ SOLN: 3.0000 mL | Freq: Two times a day (BID) | INTRAMUSCULAR | Status: DC

## 2012-09-27 MED ORDER — LIDOCAINE HCL 4 % MT SOLN
OROMUCOSAL | Status: DC | PRN
  Administered 2012-09-27: 4 mL via TOPICAL

## 2012-09-27 MED ORDER — OXYCODONE-ACETAMINOPHEN 5-325 MG PO TABS: 1.0000 | ORAL_TABLET | ORAL | Status: DC | PRN

## 2012-09-27 MED ORDER — LACTATED RINGERS IV SOLN
INTRAVENOUS | Status: DC | PRN
  Administered 2012-09-27 (×2): via INTRAVENOUS

## 2012-09-27 MED ORDER — STERILE WATER FOR IRRIGATION IR SOLN
Status: DC | PRN
  Administered 2012-09-27: 1000 mL

## 2012-09-27 MED ORDER — DEXAMETHASONE 4 MG PO TABS
4.0000 mg | ORAL_TABLET | Freq: Four times a day (QID) | ORAL | Status: DC
  Administered 2012-09-28 – 2012-09-29 (×6): 4 mg via ORAL
  Filled 2012-09-27 (×10): qty 1

## 2012-09-27 MED ORDER — LIDOCAINE HCL (CARDIAC) 20 MG/ML IV SOLN
INTRAVENOUS | Status: DC | PRN
  Administered 2012-09-27: 100 mg via INTRAVENOUS

## 2012-09-27 MED ORDER — CEFAZOLIN SODIUM-DEXTROSE 2-3 GM-% IV SOLR
INTRAVENOUS | Status: AC
  Administered 2012-09-27: 2 g via INTRAVENOUS
  Filled 2012-09-27: qty 50

## 2012-09-27 MED ORDER — MIDAZOLAM HCL 2 MG/2ML IJ SOLN
2.0000 mg | Freq: Once | INTRAMUSCULAR | Status: AC
  Administered 2012-09-27: 2 mg via INTRAVENOUS

## 2012-09-27 MED ORDER — SODIUM CHLORIDE 0.9 % IJ SOLN: 3.0000 mL | INTRAMUSCULAR | Status: DC | PRN

## 2012-09-27 MED ORDER — DIAZEPAM 5 MG PO TABS
ORAL_TABLET | ORAL | Status: AC
  Filled 2012-09-27: qty 1

## 2012-09-27 MED ORDER — 0.9 % SODIUM CHLORIDE (POUR BTL) OPTIME
TOPICAL | Status: DC | PRN
  Administered 2012-09-27: 1000 mL

## 2012-09-27 MED ORDER — OXYCODONE HCL 5 MG PO TABS
5.0000 mg | ORAL_TABLET | Freq: Once | ORAL | Status: AC | PRN
  Administered 2012-09-27: 5 mg via ORAL

## 2012-09-27 MED ORDER — ONDANSETRON HCL 4 MG/2ML IJ SOLN: 4.0000 mg | Freq: Four times a day (QID) | INTRAMUSCULAR | Status: DC | PRN

## 2012-09-27 MED ORDER — SODIUM CHLORIDE 0.9 % IJ SOLN: 9.0000 mL | INTRAMUSCULAR | Status: DC | PRN

## 2012-09-27 MED ORDER — MORPHINE SULFATE (PF) 1 MG/ML IV SOLN
INTRAVENOUS | Status: DC
  Administered 2012-09-27 – 2012-09-28 (×2): via INTRAVENOUS
  Administered 2012-09-28: 47.63 mg via INTRAVENOUS
  Administered 2012-09-28: 15 mg via INTRAVENOUS
  Administered 2012-09-28: 01:00:00 via INTRAVENOUS
  Administered 2012-09-28: 10.5 mg via INTRAVENOUS
  Administered 2012-09-28: 09:00:00 via INTRAVENOUS
  Administered 2012-09-28: 4.3 mg via INTRAVENOUS
  Administered 2012-09-28: 24.46 mg via INTRAVENOUS
  Administered 2012-09-28: 13:00:00 via INTRAVENOUS
  Administered 2012-09-29: 10.5 mg via INTRAVENOUS
  Administered 2012-09-29: 25.3 mg via INTRAVENOUS
  Administered 2012-09-29: 9.78 mL via INTRAVENOUS
  Filled 2012-09-27 (×7): qty 25

## 2012-09-27 MED ORDER — ROCURONIUM BROMIDE 100 MG/10ML IV SOLN
INTRAVENOUS | Status: DC | PRN
  Administered 2012-09-27: 50 mg via INTRAVENOUS

## 2012-09-27 MED ORDER — OXYCODONE HCL 5 MG PO TABS
ORAL_TABLET | ORAL | Status: AC
  Filled 2012-09-27: qty 1

## 2012-09-27 MED ORDER — OXYCODONE HCL 5 MG PO TABS
10.0000 mg | ORAL_TABLET | ORAL | Status: DC | PRN
  Administered 2012-09-27 – 2012-09-29 (×7): 10 mg via ORAL
  Filled 2012-09-27 (×7): qty 2

## 2012-09-27 MED ORDER — ACETAMINOPHEN 325 MG PO TABS: 650.0000 mg | ORAL_TABLET | ORAL | Status: DC | PRN

## 2012-09-27 MED ORDER — ACETAMINOPHEN 650 MG RE SUPP: 650.0000 mg | RECTAL | Status: DC | PRN

## 2012-09-27 MED ORDER — PROPOFOL 10 MG/ML IV BOLUS
INTRAVENOUS | Status: DC | PRN
  Administered 2012-09-27: 200 mg via INTRAVENOUS

## 2012-09-27 MED ORDER — SODIUM CHLORIDE 0.9 % IV SOLN: INTRAVENOUS | Status: DC

## 2012-09-27 MED ORDER — ZOLPIDEM TARTRATE 5 MG PO TABS: 10.0000 mg | ORAL_TABLET | Freq: Every evening | ORAL | Status: DC | PRN

## 2012-09-27 MED ORDER — SODIUM CHLORIDE 0.9 % IV SOLN: 250.0000 mL | INTRAVENOUS | Status: DC

## 2012-09-27 MED ORDER — DEXAMETHASONE SODIUM PHOSPHATE 4 MG/ML IJ SOLN
INTRAMUSCULAR | Status: DC | PRN
  Administered 2012-09-27: 8 mg via INTRAVENOUS

## 2012-09-27 MED ORDER — VECURONIUM BROMIDE 10 MG IV SOLR
INTRAVENOUS | Status: DC | PRN
  Administered 2012-09-27: 2 mg via INTRAVENOUS
  Administered 2012-09-27: 1 mg via INTRAVENOUS

## 2012-09-27 MED ORDER — DIAZEPAM 5 MG PO TABS: 5.0000 mg | ORAL_TABLET | Freq: Once | ORAL | Status: DC

## 2012-09-27 MED ORDER — OXYCODONE HCL 5 MG/5ML PO SOLN: 5.0000 mg | Freq: Once | ORAL | Status: AC | PRN

## 2012-09-27 SURGICAL SUPPLY — 52 items
BANDAGE GAUZE ELAST BULKY 4 IN (GAUZE/BANDAGES/DRESSINGS) ×4 IMPLANT
BENZOIN TINCTURE PRP APPL 2/3 (GAUZE/BANDAGES/DRESSINGS) ×2 IMPLANT
BIT DRILL SM SPINE QC 14 (BIT) ×2 IMPLANT
BLADE ULTRA TIP 2M (BLADE) ×2 IMPLANT
BUR BARREL STRAIGHT FLUTE 4.0 (BURR) IMPLANT
BUR MATCHSTICK NEURO 3.0 LAGG (BURR) ×2 IMPLANT
CANISTER SUCTION 2500CC (MISCELLANEOUS) ×2 IMPLANT
CLOTH BEACON ORANGE TIMEOUT ST (SAFETY) ×2 IMPLANT
CONT SPEC 4OZ CLIKSEAL STRL BL (MISCELLANEOUS) ×2 IMPLANT
COVER MAYO STAND STRL (DRAPES) ×2 IMPLANT
DRAIN JACKSON PRATT 10MM FLAT (MISCELLANEOUS) ×2 IMPLANT
DRAPE C-ARM 42X72 X-RAY (DRAPES) ×4 IMPLANT
DRAPE LAPAROTOMY 100X72 PEDS (DRAPES) ×2 IMPLANT
DRAPE MICROSCOPE LEICA (MISCELLANEOUS) ×2 IMPLANT
DRAPE POUCH INSTRU U-SHP 10X18 (DRAPES) ×2 IMPLANT
DURAPREP 6ML APPLICATOR 50/CS (WOUND CARE) ×2 IMPLANT
ELECT REM PT RETURN 9FT ADLT (ELECTROSURGICAL) ×2
ELECTRODE REM PT RTRN 9FT ADLT (ELECTROSURGICAL) ×1 IMPLANT
EVACUATOR SILICONE 100CC (DRAIN) ×2 IMPLANT
GAUZE SPONGE 4X4 16PLY XRAY LF (GAUZE/BANDAGES/DRESSINGS) IMPLANT
GLOVE BIOGEL M 8.0 STRL (GLOVE) ×2 IMPLANT
GLOVE ECLIPSE 7.5 STRL STRAW (GLOVE) ×2 IMPLANT
GLOVE EXAM NITRILE LRG STRL (GLOVE) IMPLANT
GLOVE EXAM NITRILE MD LF STRL (GLOVE) ×2 IMPLANT
GLOVE EXAM NITRILE XL STR (GLOVE) IMPLANT
GLOVE EXAM NITRILE XS STR PU (GLOVE) IMPLANT
GLOVE SURG SS PI 8.0 STRL IVOR (GLOVE) ×2 IMPLANT
GOWN BRE IMP SLV AUR LG STRL (GOWN DISPOSABLE) ×2 IMPLANT
GOWN BRE IMP SLV AUR XL STRL (GOWN DISPOSABLE) ×2 IMPLANT
GOWN STRL REIN 2XL LVL4 (GOWN DISPOSABLE) ×2 IMPLANT
HEAD HALTER (SOFTGOODS) ×2 IMPLANT
HEMOSTAT POWDER KIT SURGIFOAM (HEMOSTASIS) ×2 IMPLANT
KIT BASIN OR (CUSTOM PROCEDURE TRAY) ×2 IMPLANT
KIT ROOM TURNOVER OR (KITS) ×2 IMPLANT
NEEDLE SPNL 22GX3.5 QUINCKE BK (NEEDLE) ×2 IMPLANT
NS IRRIG 1000ML POUR BTL (IV SOLUTION) ×2 IMPLANT
PACK LAMINECTOMY NEURO (CUSTOM PROCEDURE TRAY) ×2 IMPLANT
PATTIES SURGICAL .5 X1 (DISPOSABLE) ×2 IMPLANT
PLATE ANT CERV XTEND 2 LV 30 (Plate) ×2 IMPLANT
PUTTY BONE GRAFT KIT 2.5ML (Bone Implant) ×2 IMPLANT
RUBBERBAND STERILE (MISCELLANEOUS) ×4 IMPLANT
SCREW XTD VAR 4.2 SELF TAP (Screw) ×12 IMPLANT
SPACER ACDF SM LORDOTIC 7 (Spacer) ×2 IMPLANT
SPONGE GAUZE 4X4 12PLY (GAUZE/BANDAGES/DRESSINGS) ×2 IMPLANT
SPONGE INTESTINAL PEANUT (DISPOSABLE) ×2 IMPLANT
SPONGE SURGIFOAM ABS GEL SZ50 (HEMOSTASIS) ×2 IMPLANT
STRIP CLOSURE SKIN 1/2X4 (GAUZE/BANDAGES/DRESSINGS) ×2 IMPLANT
SUT VIC AB 3-0 SH 8-18 (SUTURE) ×2 IMPLANT
SYR 20ML ECCENTRIC (SYRINGE) ×2 IMPLANT
TOWEL OR 17X24 6PK STRL BLUE (TOWEL DISPOSABLE) ×2 IMPLANT
TOWEL OR 17X26 10 PK STRL BLUE (TOWEL DISPOSABLE) ×2 IMPLANT
WATER STERILE IRR 1000ML POUR (IV SOLUTION) ×2 IMPLANT

## 2012-09-27 NOTE — Progress Notes (Signed)
Pt. Starting to fall asleep in intervals.

## 2012-09-27 NOTE — Preoperative (Signed)
Beta Blockers   Reason not to administer Beta Blockers:Not Applicable 

## 2012-09-27 NOTE — Progress Notes (Addendum)
Pt. transferred and report given to nurse.Patient still complaining of pain. MD aware of patient situation and new orders placed for pain management per MD. RN will continue to monitor patient

## 2012-09-27 NOTE — Progress Notes (Signed)
Dr. Justin Mend at bedside to see pt.

## 2012-09-27 NOTE — Plan of Care (Signed)
Problem: Consults Goal: Diagnosis - Spinal Surgery Outcome: Completed/Met Date Met:  09/27/12 Cervical Spine Fusion

## 2012-09-27 NOTE — Progress Notes (Signed)
Pt. C/o of severe pain after RN administered pain meds (Morphine,oxycodone and valium) as ordered by MD. Pt.stated medications given is not helping with his pain at all. "You are not given me enough medicine to take care of my pain-I have a history of severe pain and i need high dose of dilaudid to take care of it"." I go to pain clinic at [facility] pain clinic and i know all about pain medicine and what i can tolerate in my body and what you are giving me is not touching my pain at all". MD was notified of patient situation and will continue to monitor.

## 2012-09-27 NOTE — Progress Notes (Signed)
No weakness,screaming of pain. Dilaudid is not working. Sensory normal.

## 2012-09-27 NOTE — Transfer of Care (Signed)
Immediate Anesthesia Transfer of Care Note  Patient: Corey Gilbert  Procedure(s) Performed: Procedure(s) (LRB) with comments: ANTERIOR CERVICAL DECOMPRESSION/DISCECTOMY FUSION 2 LEVELS (N/A) - Cervical four-five, Cervical five-six,  Anterior cervical decompression/diskectomy/fusion/plate  Patient Location: PACU  Anesthesia Type:General  Level of Consciousness: awake, alert , oriented and patient cooperative  Airway & Oxygen Therapy: Patient Spontanous Breathing and Patient connected to nasal cannula oxygen  Post-op Assessment: Report given to PACU RN and Post -op Vital signs reviewed and stable  Post vital signs: Reviewed and stable  Complications: No apparent anesthesia complications

## 2012-09-27 NOTE — Progress Notes (Signed)
Op note [phone]

## 2012-09-27 NOTE — H&P (Signed)
Corey Gilbert is an 47 y.o. male.   Chief Complaint: neck pain HPI: patient who underwent lumbar discectomy few months ago but prior to that he has been complaining of neck pain with radiation to the right upper extremity associated with tingling.  Past Medical History  Diagnosis Date  . Mental disorder     Bi- Polar  . Complication of anesthesia   . PONV (postoperative nausea and vomiting)   . Near syncope     due to opain, last time  early Sept 2013  . Depression   . Hepatitis     Hepatitis C  . Concussion   . Kidney stones   . Arthritis   . Family history of anesthesia complication     pon& v    Past Surgical History  Procedure Date  . Wrist surgery 2013    left at [facility]  . Wrist surgery 2000    right  . Lumbar laminectomy/decompression microdiscectomy 06/14/2012    Procedure: LUMBAR LAMINECTOMY/DECOMPRESSION MICRODISCECTOMY 1 LEVEL;  Surgeon: Karn Cassis, MD;  Location: [facility];  Service: Neurosurgery;  Laterality: Left;  Left Lumbar Removal of Fragment/Laminectomy Lumbar Three-Four  . Back surgery 2013    at [facility]  . Sp arthro thumb*l* 2013  left  2000 right    bilateral tendon  release.     History reviewed. No pertinent family history. Social History:  reports that he has never smoked. He does not have any smokeless tobacco history on file. He reports that he uses illicit drugs (Marijuana and Oxycodone). He reports that he does not drink alcohol.  Allergies:  Allergies  Allergen Reactions  . Acetaminophen   . Baclofen   . Gabapentin   . Ibuprofen   . Other     Antidepressants   . Toradol (Ketorolac Tromethamine) Other (See Comments)    Reacts with other medications    Medications Prior to Admission  Medication Sig Dispense Refill  . Multiple Vitamin (MULTIVITAMIN WITH MINERALS) TABS Take 1 tablet by mouth daily.      Marland Kitchen oxyCODONE (OXYCONTIN) 10 MG 12 hr tablet Take 10 mg by mouth every 4 (four) hours as needed. For pain        No results found for  this or any previous visit (from the past 48 hour(s)). No results found.  Review of Systems  Constitutional: Negative.   HENT: Positive for neck pain.   Eyes: Negative.   Respiratory: Negative.   Cardiovascular: Negative.   Gastrointestinal: Negative.   Genitourinary: Negative.   Musculoskeletal: Positive for back pain.  Skin: Negative.   Neurological: Positive for tingling, sensory change and focal weakness.  Endo/Heme/Allergies: Negative.   Psychiatric/Behavioral: Negative.     Blood pressure 145/94, pulse 73, temperature 98.1 F (36.7 C), temperature source Oral, resp. rate 20, SpO2 99.00%. Physical Exam hent, nl. Neck,painwith mobility. Cv, nl. Lungs,clear. Abdomen, soft. Extremities, nl.NEURO weakness of both biceps and deltoids. 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 At c34 and 67 some calcification of the posterior ligament  Assessment/Plan Decompression and fusion at cervical 45 and 56. He is aware of risks and complications.  Ajamu Maxon M 09/27/2012, 8:00 AM

## 2012-09-27 NOTE — Anesthesia Postprocedure Evaluation (Signed)
Anesthesia Post Note  Patient: Corey Gilbert  Procedure(s) Performed: Procedure(s) (LRB): ANTERIOR CERVICAL DECOMPRESSION/DISCECTOMY FUSION 2 LEVELS (N/A)  Anesthesia type: General  Patient location: PACU  Post pain: Pain level controlled  Post assessment: Patient's Cardiovascular Status Stable  Last Vitals:  Filed Vitals:   09/27/12 1204  BP:   Pulse: 88  Temp:   Resp: 13    Post vital signs: Reviewed and stable  Level of consciousness: alert  Complications: No apparent anesthesia complications

## 2012-09-27 NOTE — Progress Notes (Signed)
Review medications . Getting full dilaudid pca, oxycodone 10 mgs evry 4 hours and diazepam 5 mgs every 8 hours prn. Can not increase analgesics . im worry about overdose. Patient wants to be intubated and sedated. Spoke with him and wife about the amount of medication he is taken

## 2012-09-27 NOTE — Progress Notes (Signed)
Rn was assessing patient's pain when Pt. Informed RN about pain medicine still not helping his pain. MD was called and informed about patient situation and RN was ordered to transfer patient to med surgical unit for more monitoring. RN will continue to monitor patient.

## 2012-09-27 NOTE — Progress Notes (Signed)
Pt. Continues to scream in pain, and shakes rails, breathing exercises given to pt for relaxation. DR Justin Mend aware and orders noted

## 2012-09-27 NOTE — Progress Notes (Signed)
Pt. With lots of pain and anxiety, pt. Hitting side rails and shaking fists, Dr. Toy Cookey aware, pt. Given extra pain meds and Versed per MDs orders.Marland Kitchen

## 2012-09-27 NOTE — Progress Notes (Signed)
Orthopedic Tech Progress Note Patient Details:  Corey Gilbert [birth year] [id number]  Ortho Devices Type of Ortho Device: Soft collar Ortho Device/Splint Location: DROPPED SOFT COLLAR OFF AT OR DESK Ortho Device/Splint Interventions: Ordered   Shawnie Pons 09/27/2012, 12:22 PM

## 2012-09-27 NOTE — Anesthesia Preprocedure Evaluation (Addendum)
Anesthesia Evaluation  Patient identified by MRN, date of birth, ID band Patient awake    Reviewed: Allergy & Precautions, H&P , NPO status , Patient's Chart, lab work & pertinent test results, reviewed documented beta blocker date and time   History of Anesthesia Complications (+) PONV and Family history of anesthesia reaction  Airway Mallampati: II TM Distance: >3 FB Neck ROM: full    Dental  (+) Dental Advisory Given   Pulmonary neg pulmonary ROS,  breath sounds clear to auscultation        Cardiovascular negative cardio ROS  Rhythm:regular     Neuro/Psych PSYCHIATRIC DISORDERS Depression Bipolar Disorder negative neurological ROS     GI/Hepatic negative GI ROS, (+) Hepatitis -  Endo/Other  negative endocrine ROS  Renal/GU Renal disease  negative genitourinary   Musculoskeletal   Abdominal   Peds  Hematology negative hematology ROS (+)   Anesthesia Other Findings See surgeon's H&P   Reproductive/Obstetrics negative OB ROS                          Anesthesia Physical Anesthesia Plan  ASA: II  Anesthesia Plan: General   Post-op Pain Management:    Induction: Intravenous  Airway Management Planned: Oral ETT  Additional Equipment:   Intra-op Plan:   Post-operative Plan: Extubation in OR  Informed Consent: I have reviewed the patients History and Physical, chart, labs and discussed the procedure including the risks, benefits and alternatives for the proposed anesthesia with the patient or authorized representative who has indicated his/her understanding and acceptance.   Dental Advisory Given  Plan Discussed with: CRNA and Surgeon  Anesthesia Plan Comments:         Anesthesia Quick Evaluation

## 2012-09-28 ENCOUNTER — Encounter (HOSPITAL_COMMUNITY): Payer: Self-pay | Admitting: Neurosurgery

## 2012-09-28 NOTE — Progress Notes (Signed)
Patient ID: Corey Gilbert, male   DOB: [birth year], 47 y.o.   MRN: [id number] [facility] BETTER. Decrease of pain. Minimal drainage, no weakness. Dc in am

## 2012-09-28 NOTE — Op Note (Signed)
NAMESOTERO, BRINKMEYER NO.:  [id number]  MEDICAL RECORD NO.:  [id number]  LOCATION:  [id number]                        FACILITY:  [facility]  PHYSICIAN:  Hilda Lias, M.D.   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  PROCEDURE:  C4-5, C5-6 diskectomy, decompression of spinal cord, bilateral foraminotomy, interbody fusion with cage, plate, microscope.  SURGEON:  Hilda Lias, M.D.  ASSISTANT:  Dr. Phoebe Perch.  CLINICAL HISTORY:  The patient is being admitted because of neck pain with radiation to the right upper extremity associated with weakness of the deltoid and biceps.  The patient has had degenerative disk disease, spinal stenosis with a herniated disk at the level of c4-5, c5-6 and borderline between _67_________.  Surgery was advised and the patient was aware of the risks and benefits.  DESCRIPTION OF PROCEDURE:  The patient was taken to the OR, and after intubation, the left side of the neck was cleaned with DuraPrep. Transverse incision was made through the skin and subcutaneous tissue straight to the cervical spine.  X-rays, lateral view showed that the needle was at the level of C4-5.  From then on, we opened the anterior ligament at C4-5 and C5-6.  We brought the microscope into the area.  At both levels, we found quite a bit of degenerative disk disease.  At the level of C5-6, we found not only he has central stenosis, but there was a herniated disk going to the right side.  Decompression of the spinal cord at both C6 nerve root was achieved.  At the level of 4-5, there  was spinal spondylosis, the right worse than the left one.  We had complete decompression at this level with plenty of room for both C5 nerve roots. From then on, the endplates were drilled.  Two cages of 7 mm height, with autograft and bone was used at the disk space.  Lateral cervical spine x-rays showed good position of the cage.  From then on, plate with 6 screws was used to keep the cage in place.  The area was irrigated and we accomplished good hemostasis. We decided to leave drain overnight.  From then on, the wound was closed with Vicryl and Steri-Strips.          ______________________________ Hilda Lias, M.D.     EB/MEDQ  D:  09/27/2012  T:  09/28/2012  Job:  [id number]

## 2012-09-28 NOTE — Progress Notes (Signed)
UR COMPLETED  

## 2012-09-29 NOTE — Progress Notes (Signed)
Patient given D/C instructions and education. All questions answered to patient's satisfaction. Pt D/C home with no signs of acute distress.

## 2012-09-29 NOTE — Discharge Summary (Signed)
Physician Discharge Summary  Patient ID: Corey Gilbert MRN: [id number] DOB/AGE: [birth year] 46 y.o.  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 rate 18, height 5\' 10"  (1.778 m), weight 86.183 kg (190 lb), SpO2 99.00%. No weakness  Disposition: home on roxicodone and dilaudid. He will f/u with pain clinic and my office     Medication List     As of 09/29/2012  9:47 AM    ASK your doctor about these medications         multivitamin with minerals Tabs   Take 1 tablet by mouth daily.      oxyCODONE 10 MG 12 hr tablet   Commonly known as: OXYCONTIN   Take 10 mg by mouth every 4 (four) hours as needed. For pain         Signed: Karn Cassis 09/29/2012, 9:47 AM

## 2012-09-29 NOTE — Care Management Note (Signed)
    Page 1 of 1   09/29/2012     2:10:33 PM   CARE MANAGEMENT NOTE 09/29/2012  Patient:  [facility]   Account Number:  [id number]  Date Initiated:  09/29/2012  Documentation initiated by:  Jacquelynn Cree  Subjective/Objective Assessment:   Admitted postop C4-6 ACDF     Action/Plan:   return home   Anticipated DC Date:  09/29/2012   Anticipated DC Plan:  HOME/SELF CARE      DC Planning Services  CM consult      Choice offered to / List presented to:             Status of service:  Completed, signed off Medicare Important Message given?   (If response is "NO", the following Medicare IM given date fields will be blank) Date Medicare IM given:   Date Additional Medicare IM given:    Discharge Disposition:  HOME/SELF CARE  Per UR Regulation:  Reviewed for med. necessity/level of care/duration of stay  If discussed at Long Length of Stay Meetings, dates discussed:    Comments:

## 2013-01-06 DIAGNOSIS — I4510 Unspecified right bundle-branch block: Secondary | ICD-10-CM | POA: Insufficient documentation

## 2013-01-06 DIAGNOSIS — M961 Postlaminectomy syndrome, not elsewhere classified: Secondary | ICD-10-CM | POA: Insufficient documentation

## 2013-01-06 DIAGNOSIS — F319 Bipolar disorder, unspecified: Secondary | ICD-10-CM | POA: Insufficient documentation

## 2013-01-06 DIAGNOSIS — Q619 Cystic kidney disease, unspecified: Secondary | ICD-10-CM | POA: Insufficient documentation

## 2013-02-14 DIAGNOSIS — M47816 Spondylosis without myelopathy or radiculopathy, lumbar region: Secondary | ICD-10-CM | POA: Insufficient documentation

## 2013-03-28 DIAGNOSIS — M47817 Spondylosis without myelopathy or radiculopathy, lumbosacral region: Secondary | ICD-10-CM | POA: Insufficient documentation

## 2013-04-26 DIAGNOSIS — Z79899 Other long term (current) drug therapy: Secondary | ICD-10-CM | POA: Insufficient documentation

## 2013-04-26 DIAGNOSIS — G8928 Other chronic postprocedural pain: Secondary | ICD-10-CM | POA: Insufficient documentation

## 2013-12-01 DIAGNOSIS — M25569 Pain in unspecified knee: Secondary | ICD-10-CM | POA: Insufficient documentation

## 2014-06-12 DIAGNOSIS — F419 Anxiety disorder, unspecified: Secondary | ICD-10-CM | POA: Insufficient documentation

## 2014-06-13 ENCOUNTER — Encounter (HOSPITAL_COMMUNITY): Payer: Self-pay | Admitting: Emergency Medicine

## 2014-06-13 ENCOUNTER — Emergency Department (HOSPITAL_COMMUNITY)
Admission: EM | Admit: 2014-06-13 | Discharge: 2014-06-13 | Discharge status: Against Medical Advice | Payer: Medicaid Other | Attending: Emergency Medicine | Admitting: Emergency Medicine

## 2014-06-13 DIAGNOSIS — F329 Major depressive disorder, single episode, unspecified: Secondary | ICD-10-CM | POA: Diagnosis not present

## 2014-06-13 DIAGNOSIS — F3289 Other specified depressive episodes: Secondary | ICD-10-CM | POA: Diagnosis not present

## 2014-06-13 DIAGNOSIS — Z79899 Other long term (current) drug therapy: Secondary | ICD-10-CM | POA: Insufficient documentation

## 2014-06-13 DIAGNOSIS — Z8739 Personal history of other diseases of the musculoskeletal system and connective tissue: Secondary | ICD-10-CM | POA: Insufficient documentation

## 2014-06-13 DIAGNOSIS — G8929 Other chronic pain: Secondary | ICD-10-CM | POA: Insufficient documentation

## 2014-06-13 DIAGNOSIS — R443 Hallucinations, unspecified: Secondary | ICD-10-CM | POA: Insufficient documentation

## 2014-06-13 DIAGNOSIS — M545 Low back pain, unspecified: Secondary | ICD-10-CM | POA: Diagnosis not present

## 2014-06-13 DIAGNOSIS — R45851 Suicidal ideations: Secondary | ICD-10-CM | POA: Insufficient documentation

## 2014-06-13 DIAGNOSIS — Z9889 Other specified postprocedural states: Secondary | ICD-10-CM | POA: Diagnosis not present

## 2014-06-13 DIAGNOSIS — Z87442 Personal history of urinary calculi: Secondary | ICD-10-CM | POA: Insufficient documentation

## 2014-06-13 DIAGNOSIS — F1210 Cannabis abuse, uncomplicated: Secondary | ICD-10-CM | POA: Insufficient documentation

## 2014-06-13 DIAGNOSIS — Z8619 Personal history of other infectious and parasitic diseases: Secondary | ICD-10-CM | POA: Diagnosis not present

## 2014-06-13 LAB — COMPREHENSIVE METABOLIC PANEL
ALBUMIN: 4.1 g/dL (ref 3.5–5.2)
ALK PHOS: 64 U/L (ref 39–117)
ALT: 57 U/L — ABNORMAL HIGH (ref 0–53)
ANION GAP: 11 (ref 5–15)
AST: 41 U/L — AB (ref 0–37)
BILIRUBIN TOTAL: 0.5 mg/dL (ref 0.3–1.2)
BUN: 8 mg/dL (ref 6–23)
CHLORIDE: 101 meq/L (ref 96–112)
CO2: 26 meq/L (ref 19–32)
CREATININE: 0.76 mg/dL (ref 0.50–1.35)
Calcium: 9.3 mg/dL (ref 8.4–10.5)
GFR calc Af Amer: >90 mL/min (ref >90)
Glucose, Bld: 85 mg/dL (ref 70–99)
POTASSIUM: 3.8 meq/L (ref 3.7–5.3)
Sodium: 138 mEq/L (ref 137–147)
Total Protein: 7.2 g/dL (ref 6.0–8.3)

## 2014-06-13 LAB — ETHANOL: Alcohol, Ethyl (B): <11 mg/dL (ref 0–11)

## 2014-06-13 LAB — RAPID URINE DRUG SCREEN, HOSP PERFORMED
Amphetamines: NOT DETECTED
BARBITURATES: NOT DETECTED
BENZODIAZEPINES: NOT DETECTED
COCAINE: NOT DETECTED
Opiates: NOT DETECTED
TETRAHYDROCANNABINOL: POSITIVE — AB

## 2014-06-13 LAB — ACETAMINOPHEN LEVEL

## 2014-06-13 LAB — CBC
HEMATOCRIT: 42.6 % (ref 39.0–52.0)
Hemoglobin: 15.2 g/dL (ref 13.0–17.0)
MCH: 30.5 pg (ref 26.0–34.0)
MCHC: 35.7 g/dL (ref 30.0–36.0)
MCV: 85.5 fL (ref 78.0–100.0)
Platelets: 99 10*3/uL — ABNORMAL LOW (ref 150–400)
RBC: 4.98 MIL/uL (ref 4.22–5.81)
RDW: 13.1 % (ref 11.5–15.5)
WBC: 9.1 10*3/uL (ref 4.0–10.5)

## 2014-06-13 LAB — SALICYLATE LEVEL: Salicylate Lvl: <2 mg/dL — ABNORMAL LOW (ref 2.8–20.0)

## 2014-06-13 MED ORDER — OXYCODONE HCL 5 MG PO TABS
10.0000 mg | ORAL_TABLET | Freq: Once | ORAL | Status: AC
  Administered 2014-06-13: 10 mg via ORAL
  Filled 2014-06-13: qty 2

## 2014-06-13 MED ORDER — HYDROMORPHONE HCL 2 MG PO TABS
4.0000 mg | ORAL_TABLET | Freq: Three times a day (TID) | ORAL | Status: DC
  Administered 2014-06-13: 4 mg via ORAL
  Filled 2014-06-13: qty 2

## 2014-06-13 MED ORDER — ONDANSETRON HCL 4 MG PO TABS: 4.0000 mg | ORAL_TABLET | Freq: Three times a day (TID) | ORAL | Status: DC | PRN

## 2014-06-13 MED ORDER — ALUM & MAG HYDROXIDE-SIMETH 200-200-20 MG/5ML PO SUSP: 30.0000 mL | ORAL | Status: DC | PRN

## 2014-06-13 MED ORDER — DIAZEPAM 5 MG PO TABS
5.0000 mg | ORAL_TABLET | Freq: Once | ORAL | Status: AC
  Administered 2014-06-13: 5 mg via ORAL
  Filled 2014-06-13: qty 1

## 2014-06-13 MED ORDER — HALOPERIDOL LACTATE 5 MG/ML IJ SOLN
2.5000 mg | Freq: Once | INTRAMUSCULAR | Status: AC
  Administered 2014-06-13: 2.5 mg via INTRAMUSCULAR
  Filled 2014-06-13: qty 1

## 2014-06-13 MED ORDER — HYDROMORPHONE HCL 2 MG PO TABS
4.0000 mg | ORAL_TABLET | ORAL | Status: AC
  Administered 2014-06-13: 4 mg via ORAL
  Filled 2014-06-13: qty 2

## 2014-06-13 MED ORDER — ZOLPIDEM TARTRATE 5 MG PO TABS: 5.0000 mg | ORAL_TABLET | Freq: Every evening | ORAL | Status: DC | PRN

## 2014-06-13 MED ORDER — LORAZEPAM 2 MG/ML IJ SOLN
1.0000 mg | Freq: Once | INTRAMUSCULAR | Status: AC
  Administered 2014-06-13: 1 mg via INTRAMUSCULAR
  Filled 2014-06-13: qty 1

## 2014-06-13 MED ORDER — NICOTINE 21 MG/24HR TD PT24
21.0000 mg | MEDICATED_PATCH | Freq: Every day | TRANSDERMAL | Status: DC
  Administered 2014-06-13: 21 mg via TRANSDERMAL
  Filled 2014-06-13: qty 1

## 2014-06-13 MED ORDER — LORAZEPAM 1 MG PO TABS
1.0000 mg | ORAL_TABLET | Freq: Three times a day (TID) | ORAL | Status: DC | PRN
  Administered 2014-06-13: 1 mg via ORAL
  Filled 2014-06-13: qty 1

## 2014-06-13 MED ORDER — OXYCODONE HCL 5 MG PO TABS
15.0000 mg | ORAL_TABLET | Freq: Three times a day (TID) | ORAL | Status: DC
  Administered 2014-06-13: 15 mg via ORAL
  Filled 2014-06-13: qty 3

## 2014-06-13 MED ORDER — DIPHENHYDRAMINE HCL 50 MG/ML IJ SOLN
12.5000 mg | Freq: Once | INTRAMUSCULAR | Status: AC
  Administered 2014-06-13: 12.5 mg via INTRAMUSCULAR
  Filled 2014-06-13: qty 1

## 2014-06-13 NOTE — ED Notes (Signed)
Sleeping at present.

## 2014-06-13 NOTE — ED Notes (Signed)
Pt dozing, wife still at bedside. Wife concerned that pt wants to go home and that he will "go off and get arrested if he stays" . Pt waking up, yelling that staff has not done anything for his pain, and he does not want to stay here. Telling wife to make sure that she writes everyone's name down. Dr. Gwendolyn Grant notified of pt's insistence of leaving.

## 2014-06-13 NOTE — ED Notes (Signed)
Pt is raising his voice, saying that "I have been here for hours and no one has addressed my pain. I have given everyone the list of meds that works for me, but no one is giving them to me. I don't know why you don't put an IV in and just give me what works." Pt has a discharge paper from [facility] with meds that he received in the ED there: Dilaudid  IV, Dilaudid 2 mg IV, Ativan  IV, Prednisone 60 mg po. This nurse explained to pt that he is not able to order his own medicine. Pt demanding to see a physician, stating that " I don't want to see anymore of those young girls! I manipulated them into getting me back here, and can manipulate them for anything!" continually focused on pain meds. Wife remains at bedside.  Visiting hours and Pod C rules given to wife, and explained to wife and patient.

## 2014-06-13 NOTE — ED Notes (Signed)
This nurse went to give pt pain meds as ordered- pt became loud and yelling-- "you don't know what you are doing! You did not tell the doctor anything, this medication is not going to help! I do not want to be here if I don't get my medicine." pt was found to be using his cell phone. Explained that he would have to give that and his clothing to this nurse-- pt yelling "I am not giving you my phone! I don't give a fuck what your rules are" this nurse explained that all pts have to follow rules and if he did not want to give up his phone and belongings, and he was here voluntarily he could leave. Pt continues to be loud and swearing. Sitter from 2 rooms down the hall came to door and asked if this nurse was ok-- pt became even louder, yelling "do not come in here and threaten me! Or you will have something to worry about!"

## 2014-06-13 NOTE — BH Assessment (Signed)
TTS assessment complete.  Muslima Toppins, MS, LCASA Assessment Counselor  

## 2014-06-13 NOTE — ED Notes (Signed)
Pt agreed to give clothing and phone to staff to lock up, without incident

## 2014-06-13 NOTE — ED Notes (Signed)
Pt received from Ed, Cliffton Asters, RN- ambulated to rm C20, wife with pt.

## 2014-06-13 NOTE — ED Provider Notes (Signed)
CSN: [id number]     Arrival date & time 06/13/14  7829 History   First MD Initiated Contact with Patient 06/13/14 1008     Chief Complaint  Patient presents with  . Back Pain  . Suicidal     (Consider location/radiation/quality/duration/timing/severity/associated sxs/prior Treatment) HPI Comments: Patient is a 48 year old male the past medical history of bipolar disorder, depression, hepatitis, kidney stones, chronic back pain and arthritis who presents to the emergency department complaining of an exacerbation of his chronic low back pain x2 weeks. Patient reports 2 weeks ago he had a mechanical fall causing him to land onto his back. Since then, he has been experiencing pain radiating through his left hip down the inside of his left thigh into his foot, worse with movement. He has tried taking Excedrin and tizanidine with minimal relief. He saw his PCP yesterday who is referring him to pain management and advised him to see his neurosurgeon Dr. Jeral Fruit. Pt states he also sees a neuroscientist in [place] who he has an appointment with on 06/18/14. Hx of microdiscectomy of lumbar spine. Denies loss of control of bowels or bladder or saddle anesthesia. Denies fevers. Pt also endorsing SI and worsening depression because he "cannot take the pain anymore". States he hears voices. Despite triage summary, denies HI. No plan of suicide. He is requesting help for his SI.  Patient is a 48 y.o. male presenting with back pain. The history is provided by the patient.  Back Pain   Past Medical History  Diagnosis Date  . Mental disorder     Bi- Polar  . Complication of anesthesia   . PONV (postoperative nausea and vomiting)   . Near syncope     due to opain, last time  early Sept 2013  . Depression   . Hepatitis     Hepatitis C  . Concussion   . Kidney stones   . Arthritis   . Family history of anesthesia complication     pon& v   Past Surgical History  Procedure Laterality Date  . Wrist  surgery  2013    left at [facility]  . Wrist surgery  2000    right  . Lumbar laminectomy/decompression microdiscectomy  06/14/2012    Procedure: LUMBAR LAMINECTOMY/DECOMPRESSION MICRODISCECTOMY 1 LEVEL;  Surgeon: Karn Cassis, MD;  Location: [facility];  Service: Neurosurgery;  Laterality: Left;  Left Lumbar Removal of Fragment/Laminectomy Lumbar Three-Four  . Back surgery  2013    at [facility]  . Sp arthro thumb*l*  2013  left  2000 right    bilateral tendon  release.   Marland Kitchen Anterior cervical decomp/discectomy fusion  09/27/2012    Procedure: ANTERIOR CERVICAL DECOMPRESSION/DISCECTOMY FUSION 2 LEVELS;  Surgeon: Karn Cassis, MD;  Location: [facility];  Service: Neurosurgery;  Laterality: N/A;  Cervical four-five, Cervical five-six,  Anterior cervical decompression/diskectomy/fusion/plate   History reviewed. No pertinent family history. History  Substance Use Topics  . Smoking status: Never Smoker   . Smokeless tobacco: Not on file  . Alcohol Use: No     Comment: rarely    Review of Systems  Musculoskeletal: Positive for back pain.  Psychiatric/Behavioral: Positive for suicidal ideas, hallucinations and dysphoric mood.  All other systems reviewed and are negative.     Allergies  Toradol; Acetaminophen; Baclofen; Gabapentin; Ibuprofen; and Other  Home Medications   Prior to Admission medications   Medication Sig Start Date End Date Taking? Authorizing Provider  LORazepam (ATIVAN) 0.5 MG tablet Take 0.5 mg  by mouth every 8 (eight) hours as needed for anxiety.   Yes Historical Provider, MD  Multiple Vitamin (MULTIVITAMIN WITH MINERALS) TABS Take 1 tablet by mouth daily.   Yes Historical Provider, MD  tiZANidine (ZANAFLEX) 4 MG tablet Take 4 mg by mouth every 8 (eight) hours as needed for muscle spasms.   Yes Historical Provider, MD   BP 143/99  Pulse 71  Temp(Src) 97.7 F (36.5 C) (Oral)  Resp 18  SpO2 95% Physical Exam  Nursing note and vitals reviewed. Constitutional: He is  oriented to person, place, and time. He appears well-developed and well-nourished. No distress.  HENT:  Head: Normocephalic and atraumatic.  Mouth/Throat: Oropharynx is clear and moist.  Eyes: Conjunctivae are normal.  Neck: Normal range of motion. Neck supple. No spinous process tenderness and no muscular tenderness present.  Cardiovascular: Normal rate, regular rhythm and normal heart sounds.   Pulmonary/Chest: Effort normal and breath sounds normal. No respiratory distress.  Musculoskeletal: He exhibits no edema.  TTP along left SI joint. No spinous process tenderness.  Neurological: He is alert and oriented to person, place, and time. He has normal strength.  Strength lower extremities 5/5 and equal bilateral. Sensation intact. Negative SLR bilateral.  Skin: Skin is warm and dry. No rash noted. He is not diaphoretic.  Psychiatric: His behavior is normal. He exhibits a depressed mood. He expresses suicidal ideation. He expresses no suicidal plans.  Tearful.    ED Course  Procedures (including critical care time) Labs Review Labs Reviewed  CBC - Abnormal; Notable for the following:    Platelets 99 (*)    All other components within normal limits  COMPREHENSIVE METABOLIC PANEL - Abnormal; Notable for the following:    AST 41 (*)    ALT 57 (*)    All other components within normal limits  SALICYLATE LEVEL - Abnormal; Notable for the following:    Salicylate Lvl <2.0 (*)    All other components within normal limits  URINE RAPID DRUG SCREEN (HOSP PERFORMED) - Abnormal; Notable for the following:    Tetrahydrocannabinol POSITIVE (*)    All other components within normal limits  ACETAMINOPHEN LEVEL  ETHANOL    Imaging Review No results found.   EKG Interpretation None      MDM   Final diagnoses:  None    Pt presenting with chronic back pain, SI, hallucinations. Medically cleared. Inpatient treatment suggested. No red flags concerning patient's back pain. No s/s of  central cord compression or cauda equina. Lower extremities are neurovascularly intact and patient is ambulating without difficulty.   Pt became agitated and demanding shots of narcotics, handed a list to the nurse of the medications he would like. It was explained that he cannot request shots of narcotics. He was given henadryl, haldol, ativan per Dr. Radford Pax. Pt evaluated by Renda Rolls who does not feel pt meets IVC criteria. He does not seem harm to himself or others. Pt still in the emergency dept at shift change, awaiting bed. If pt chooses to leave, he will sign out AMA, and does not need to be IVC at this time.  Case discussed with attending Dr. Radford Pax who also evaluated patient and agrees with plan of care.   Trevor Mace, PA-C 06/15/14 [phone]

## 2014-06-13 NOTE — ED Notes (Signed)
Family at bedside. 

## 2014-06-13 NOTE — ED Notes (Signed)
Pt reports chronic back pain which he wants help with, radiates into left leg, hx of back surgery. Pt also requests medical clearance due to auditory hallucinations and intermittent SI/Hi.
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
## 2014-06-13 NOTE — BH Assessment (Signed)
Spoke with Johnnette Gourd, PA-C to obtain clinicals prior to assessing patient.   Glorious Peach, MS, LCASA Assessment Counselor

## 2014-06-13 NOTE — BH Assessment (Signed)
Consulted with AC Berneice Heinrich and Serena Colonel whom is recommending inpatient psychiatric care for stabilization once he is medically cleared. Pt's UDS is in progress.  Glorious Peach, MS, LCASA Assessment Counselor

## 2014-06-13 NOTE — ED Notes (Signed)
Pt signing out AMA -- offered a w/c to door-- pt refused. Pt ambulating with cane and no limp down the hallway and out the door.

## 2014-06-13 NOTE — ED Notes (Signed)
Pt. Can have a short temper; demands to speak with dr. Andrey Cota, "all my drs. Give me appropriate med's." reassurance keeps pt. Calm. Family at bedside.

## 2014-06-13 NOTE — ED Provider Notes (Signed)
Patient here for vague SI, auditory hallucinations. Patient very angry with me for waiting 9 hours without pain meds (takes chronic pain meds for back pain). Not happy with the narcotics he was getting. Persistently denies SI to nurse and denies it to me. Denies HI to me. States auditory hallucinations still present. Seen by Psych, recommended inpatient treatment for his hallucinations, but did not recommended IVC. I do not feel patient is danger to himself or others. Patient signed out AMA.   Elwin Mocha, MD 06/14/14 [phone]

## 2014-06-13 NOTE — ED Notes (Signed)
Dr Gwendolyn Grant in to talk with patient and family. Patient yelling at Dr Gwendolyn Grant-- denies any SI or HI, denies Hallucinations. Wanting to leave -- "no one will give me the pain medicine that I want" continually interrupting Dr Gwendolyn Grant,

## 2014-06-13 NOTE — ED Notes (Signed)
Wife and son into visit. Pt was sleeping, wife woke pt up, pt jumped out of bed, without cane stating "I want to go home! They are not addressing my pain issues"

## 2014-06-13 NOTE — ED Notes (Signed)
Spoke with physician and PA regarding pt's demands for pain meds. Pt initially told this nurse that he took Oxycodone IR  -- 8 x a day.

## 2014-06-13 NOTE — ED Notes (Signed)
Pt calm at present-- agreed to take "a shot" if he could not have an IV. Dr Radford Pax informed.

## 2014-06-13 NOTE — Progress Notes (Signed)
Pt has been assessed and the recommendation is for inpatient treatment. [facility] has bed availability and pt's clinicals faxed out.  Derrell Lolling, MSW Clinical Social Worker [phone]

## 2014-06-15 NOTE — ED Provider Notes (Signed)
Medical screening examination/treatment/procedure(s) were performed by non-physician practitioner and as supervising physician I was immediately available for consultation/collaboration.    Nelia Shi, MD 06/15/14 [phone]

## 2014-06-19 DIAGNOSIS — G43009 Migraine without aura, not intractable, without status migrainosus: Secondary | ICD-10-CM | POA: Insufficient documentation

## 2014-06-19 DIAGNOSIS — R413 Other amnesia: Secondary | ICD-10-CM | POA: Insufficient documentation

## 2014-07-11 DIAGNOSIS — F09 Unspecified mental disorder due to known physiological condition: Secondary | ICD-10-CM | POA: Insufficient documentation

## 2014-11-14 ENCOUNTER — Other Ambulatory Visit (HOSPITAL_COMMUNITY): Payer: Self-pay | Admitting: Nurse Practitioner

## 2014-11-14 DIAGNOSIS — B182 Chronic viral hepatitis C: Secondary | ICD-10-CM

## 2014-11-21 ENCOUNTER — Ambulatory Visit (HOSPITAL_COMMUNITY): Payer: Medicaid Other

## 2014-11-21 ENCOUNTER — Ambulatory Visit (HOSPITAL_COMMUNITY)
Admission: RE | Admit: 2014-11-21 | Discharge: 2014-11-21 | Disposition: A | Discharge status: Home / Self Care | Payer: Medicaid Other | Source: Ambulatory Visit | Attending: Nurse Practitioner | Admitting: Nurse Practitioner

## 2014-11-21 DIAGNOSIS — B182 Chronic viral hepatitis C: Secondary | ICD-10-CM | POA: Insufficient documentation

## 2014-11-21 DIAGNOSIS — B1921 Unspecified viral hepatitis C with hepatic coma: Secondary | ICD-10-CM | POA: Diagnosis not present

## 2014-11-21 DIAGNOSIS — N281 Cyst of kidney, acquired: Secondary | ICD-10-CM | POA: Diagnosis not present

## 2014-11-23 ENCOUNTER — Other Ambulatory Visit: Payer: Self-pay | Admitting: Neurosurgery

## 2014-11-23 DIAGNOSIS — M4316 Spondylolisthesis, lumbar region: Secondary | ICD-10-CM

## 2014-12-04 ENCOUNTER — Other Ambulatory Visit: Payer: Medicaid Other

## 2014-12-04 ENCOUNTER — Inpatient Hospital Stay: Admission: RE | Admit: 2014-12-04 | Payer: Medicaid Other | Source: Ambulatory Visit

## 2014-12-10 ENCOUNTER — Other Ambulatory Visit: Payer: Self-pay

## 2014-12-11 ENCOUNTER — Ambulatory Visit
Admission: RE | Admit: 2014-12-11 | Discharge: 2014-12-11 | Disposition: A | Discharge status: Home / Self Care | Payer: Medicaid Other | Source: Ambulatory Visit | Attending: Neurosurgery | Admitting: Neurosurgery

## 2014-12-11 DIAGNOSIS — M4316 Spondylolisthesis, lumbar region: Secondary | ICD-10-CM

## 2014-12-11 MED ORDER — IOHEXOL 180 MG/ML  SOLN
15.0000 mL | Freq: Once | INTRAMUSCULAR | Status: AC | PRN
  Administered 2014-12-11: 15 mL via INTRATHECAL

## 2014-12-11 MED ORDER — DIAZEPAM 5 MG PO TABS
10.0000 mg | ORAL_TABLET | Freq: Once | ORAL | Status: AC
  Administered 2014-12-11: 10 mg via ORAL

## 2014-12-11 MED ORDER — ONDANSETRON HCL 4 MG/2ML IJ SOLN
4.0000 mg | Freq: Once | INTRAMUSCULAR | Status: AC
  Administered 2014-12-11: 4 mg via INTRAMUSCULAR

## 2014-12-11 MED ORDER — MEPERIDINE HCL 100 MG/ML IJ SOLN
100.0000 mg | Freq: Once | INTRAMUSCULAR | Status: AC
Start: 2014-12-11 — End: 2014-12-11
  Administered 2014-12-11: 100 mg via INTRAMUSCULAR

## 2014-12-11 NOTE — Progress Notes (Signed)
Pt states he has been off Amitriptyline for the past 2 days.  Discharge instructions explained to pt. 

## 2014-12-11 NOTE — Discharge Instructions (Signed)
Myelogram Discharge Instructions  1. Go home and rest quietly for the next 24 hours.  It is important to lie flat for the next 24 hours.  Get up only to go to the restroom.  You may lie in the bed or on a couch on your back, your stomach, your left side or your right side.  You may have one pillow under your head.  You may have pillows between your knees while you are on your side or under your knees while you are on your back.  2. DO NOT drive today.  Recline the seat as far back as it will go, while still wearing your seat belt, on the way home.  3. You may get up to go to the bathroom as needed.  You may sit up for 10 minutes to eat.  You may resume your normal diet and medications unless otherwise indicated.  Drink plenty of extra fluids today and tomorrow.  4. The incidence of a spinal headache with nausea and/or vomiting is about 5% (one in 20 patients).  If you develop a headache, lie flat and drink plenty of fluids until the headache goes away.  Caffeinated beverages may be helpful.  If you develop severe nausea and vomiting or a headache that does not go away with flat bed rest, call [phone].  5. You may resume normal activities after your 24 hours of bed rest is over; however, do not exert yourself strongly or do any heavy lifting tomorrow.  6. Call your physician for a follow-up appointment.   You may resume Amitriptyline on Wednesday, December 12, 2014 after 3:00p.m.

## 2014-12-18 ENCOUNTER — Ambulatory Visit (HOSPITAL_COMMUNITY): Payer: Medicaid Other

## 2014-12-25 ENCOUNTER — Other Ambulatory Visit: Payer: Self-pay | Admitting: Neurosurgery

## 2015-01-15 ENCOUNTER — Encounter (HOSPITAL_COMMUNITY)
Admission: RE | Admit: 2015-01-15 | Discharge: 2015-01-15 | Disposition: A | Discharge status: Home / Self Care | Payer: Medicaid Other | Source: Ambulatory Visit | Attending: Neurosurgery | Admitting: Neurosurgery

## 2015-01-15 ENCOUNTER — Encounter (HOSPITAL_COMMUNITY): Payer: Self-pay

## 2015-01-15 DIAGNOSIS — Z01812 Encounter for preprocedural laboratory examination: Secondary | ICD-10-CM | POA: Insufficient documentation

## 2015-01-15 DIAGNOSIS — M4316 Spondylolisthesis, lumbar region: Secondary | ICD-10-CM | POA: Insufficient documentation

## 2015-01-15 HISTORY — DX: Bipolar disorder, unspecified: F31.9

## 2015-01-15 LAB — CBC
HCT: 43.1 % (ref 39.0–52.0)
HEMOGLOBIN: 15.1 g/dL (ref 13.0–17.0)
MCH: 30.6 pg (ref 26.0–34.0)
MCHC: 35 g/dL (ref 30.0–36.0)
MCV: 87.2 fL (ref 78.0–100.0)
PLATELETS: 113 10*3/uL — AB (ref 150–400)
RBC: 4.94 MIL/uL (ref 4.22–5.81)
RDW: 13.2 % (ref 11.5–15.5)
WBC: 8.4 10*3/uL (ref 4.0–10.5)

## 2015-01-15 LAB — COMPREHENSIVE METABOLIC PANEL
ALK PHOS: 66 U/L (ref 39–117)
ALT: 15 U/L (ref 0–53)
AST: 24 U/L (ref 0–37)
Albumin: 4.1 g/dL (ref 3.5–5.2)
Anion gap: 10 (ref 5–15)
BILIRUBIN TOTAL: 0.4 mg/dL (ref 0.3–1.2)
BUN: 8 mg/dL (ref 6–23)
CO2: 26 mmol/L (ref 19–32)
CREATININE: 0.96 mg/dL (ref 0.50–1.35)
Calcium: 9.9 mg/dL (ref 8.4–10.5)
Chloride: 104 mmol/L (ref 96–112)
GFR calc Af Amer: >90 mL/min (ref >90)
Glucose, Bld: 73 mg/dL (ref 70–99)
Potassium: 3.5 mmol/L (ref 3.5–5.1)
Sodium: 140 mmol/L (ref 135–145)
Total Protein: 7.3 g/dL (ref 6.0–8.3)

## 2015-01-15 LAB — SURGICAL PCR SCREEN
MRSA, PCR: NEGATIVE
STAPHYLOCOCCUS AUREUS: NEGATIVE

## 2015-01-15 NOTE — Pre-Procedure Instructions (Signed)
Corey Gilbert  01/15/2015   Your procedure is scheduled on:  May 4  [facility] at 6:30 AM.  Call this number if you have problems the morning of surgery: [phone]               Any questions prior to surgery call [phone] (Monday-Friday 8:00am-4:00pm)   Remember:   Do not eat food or drink liquids after midnight.   Take these medicines the morning of surgery with A SIP OF WATER: oxycodone, ledipasrvir-sofosbuvir              Stop nonsteroidal anti-inflammatory drugs: advil motrin, ibuprofen,and medicines containing aspirin   Do not wear jewelry, make-up or nail polish.  Do not wear lotions, powders, or perfumes. You may wear deodorant.  Do not shave 48 hours prior to surgery. Men may shave face and neck.  Do not bring valuables to the hospital.  [facility] is not responsible    for any belongings or valuables.               Contacts, dentures or bridgework may not be worn into surgery.  Leave suitcase in the car. After surgery it may be brought to your room.  For patients admitted to the hospital, discharge time is determined by your                treatment team.               Patients discharged the day of surgery will not be allowed to drive  home.  Name and phone number of your driver:   Special Instructions: review preparing for surgery handout   Please read over the following fact sheets that you were given: Pain Booklet, Coughing and Deep Breathing, Blood Transfusion Information, MRSA Information and Surgical Site Infection Prevention

## 2015-01-15 NOTE — Progress Notes (Signed)
PCP: Dr. Andrey CampanileWilson at [facility] [place].   Pt. Refused to wear blood bracelet. Pt. Understands a sample of blood will be drawn DOS.

## 2015-01-22 MED ORDER — CEFAZOLIN SODIUM-DEXTROSE 2-3 GM-% IV SOLR
2.0000 g | INTRAVENOUS | Status: AC
  Administered 2015-01-23: 2 g via INTRAVENOUS
  Filled 2015-01-22: qty 50

## 2015-01-22 NOTE — H&P (Signed)
Corey Gilbert is an 49 y.o. male.   Chief Complaint: chronic lumbar pain HPI: patient who has been complaining of lumbar pain with radiation to both legs , no better with conservative treatment.wqalking is quite painful and he has some days when siting is unberable. He has radiological studies which showed spondylolisthesis at l5s1 with degenerative disc disease at l45.  Past Medical History  Diagnosis Date  . Mental disorder     Bi- Polar  . Complication of anesthesia   . PONV (postoperative nausea and vomiting)   . Near syncope     due to opain, last time  early Sept 2013  . Depression   . Hepatitis     Hepatitis C  . Concussion   . Kidney stones   . Arthritis   . Family history of anesthesia complication     pon& v  . Bipolar disorder     Past Surgical History  Procedure Laterality Date  . Wrist surgery  2013    [facility]  . Wrist surgery  2000    right  . Lumbar laminectomy/decompression microdiscectomy  06/14/2012    Procedure: LUMBAR LAMINECTOMY/DECOMPRESSION MICRODISCECTOMY 1 LEVEL;  Surgeon: Karn CassisErnesto M Marcellino Fidalgo, MD;  Location: [facility] OR[facility];  Service: Neurosurgery;  Laterality: Left;  Left Lumbar Removal of Fragment/Laminectomy Lumbar Three-Four  . [facility]    [facility]  . Sp arthro thumb*l*  2013  left  2000 right    bilateral tendon  release.   Marland Kitchen. Anterior cervical decomp/discectomy fusion  09/27/2012    Procedure: ANTERIOR CERVICAL DECOMPRESSION/DISCECTOMY FUSION 2 LEVELS;  Surgeon: Karn CassisErnesto M Whittney Steenson, MD;  Location: [facility] OR[facility];  Service: Neurosurgery;  Laterality: N/A;  Cervical four-five, Cervical five-six,  Anterior cervical decompression/diskectomy/fusion/plate    No family history on file. Social History:  reports that he has never smoked. He does not have any smokeless tobacco history on file. He reports that he does not drink alcohol or use illicit drugs.  Allergies:  Allergies  Allergen Reactions  . Toradol [Ketorolac Tromethamine] Other (See Comments)     Reacts with other medications; also, fever and mania  . Other Other (See Comments)    Antidepressants   . Acetaminophen Other (See Comments)    Avoids due to history of Hepatitis C  . Baclofen Other (See Comments)    Goes manic  . Gabapentin Other (See Comments)    Goes manic  . Ibuprofen Other (See Comments)    Goes manic  . Lyrica [Pregabalin] Other (See Comments)    Mania    No prescriptions prior to admission    No results found for this or any previous visit (from the past 48 hour(s)). No results found.  Review of Systems  Constitutional: Negative.   HENT: Negative.   Eyes: Negative.   Respiratory: Negative.   Cardiovascular: Negative.   Gastrointestinal: Negative.   Genitourinary: Negative.   Musculoskeletal: Positive for back pain.  Skin: Negative.   Neurological: Positive for sensory change and focal weakness.  Endo/Heme/Allergies: Negative.   Psychiatric/Behavioral: Negative.     There were no vitals taken for this visit. Physical Exam hent,nl. Neck, anterior scar. Cv, nl. Lungs, clear. Abdomen, soft. Extremities, nl. NEURO SLR positive bilaterally at 60 degrees . Mild weakness of DF both feet. Scar in lumbar area  Assessment/Plan Patient to go ahead with decompression and fusion at l45, l5s1. He is aware of risks and complications  Corey Gilbert M 01/22/2015, 4:51 PM

## 2015-01-23 ENCOUNTER — Inpatient Hospital Stay (HOSPITAL_COMMUNITY): Payer: Medicaid Other | Admitting: Certified Registered"

## 2015-01-23 ENCOUNTER — Inpatient Hospital Stay (HOSPITAL_COMMUNITY): Payer: Medicaid Other

## 2015-01-23 ENCOUNTER — Encounter (HOSPITAL_COMMUNITY): Payer: Self-pay | Admitting: *Deleted

## 2015-01-23 ENCOUNTER — Inpatient Hospital Stay (HOSPITAL_COMMUNITY): Payer: Medicaid Other | Admitting: Vascular Surgery

## 2015-01-23 ENCOUNTER — Inpatient Hospital Stay (HOSPITAL_COMMUNITY)
Admission: RE | Admit: 2015-01-23 | Discharge: 2015-01-25 | DRG: 460 | Disposition: A | Discharge status: Home / Self Care | Payer: Medicaid Other | Source: Ambulatory Visit | Attending: Neurosurgery | Admitting: Neurosurgery

## 2015-01-23 ENCOUNTER — Encounter (HOSPITAL_COMMUNITY)
Admission: RE | Disposition: A | Discharge status: Home / Self Care | Payer: Medicaid Other | Source: Ambulatory Visit | Attending: Neurosurgery

## 2015-01-23 DIAGNOSIS — M1990 Unspecified osteoarthritis, unspecified site: Secondary | ICD-10-CM | POA: Diagnosis present

## 2015-01-23 DIAGNOSIS — B1920 Unspecified viral hepatitis C without hepatic coma: Secondary | ICD-10-CM | POA: Diagnosis present

## 2015-01-23 DIAGNOSIS — F319 Bipolar disorder, unspecified: Secondary | ICD-10-CM | POA: Diagnosis present

## 2015-01-23 DIAGNOSIS — M4316 Spondylolisthesis, lumbar region: Secondary | ICD-10-CM | POA: Diagnosis present

## 2015-01-23 DIAGNOSIS — M5116 Intervertebral disc disorders with radiculopathy, lumbar region: Secondary | ICD-10-CM | POA: Diagnosis present

## 2015-01-23 DIAGNOSIS — M545 Low back pain: Secondary | ICD-10-CM | POA: Diagnosis present

## 2015-01-23 DIAGNOSIS — Z981 Arthrodesis status: Secondary | ICD-10-CM | POA: Diagnosis not present

## 2015-01-23 DIAGNOSIS — Z419 Encounter for procedure for purposes other than remedying health state, unspecified: Secondary | ICD-10-CM

## 2015-01-23 LAB — TYPE AND SCREEN
ABO/RH(D): O POS
ANTIBODY SCREEN: NEGATIVE

## 2015-01-23 LAB — ABO/RH: ABO/RH(D): O POS

## 2015-01-23 SURGERY — POSTERIOR LUMBAR FUSION 2 LEVEL
Anesthesia: General | Site: Back

## 2015-01-23 MED ORDER — PROPOFOL 10 MG/ML IV BOLUS
INTRAVENOUS | Status: AC
  Filled 2015-01-23: qty 20

## 2015-01-23 MED ORDER — ROCURONIUM BROMIDE 50 MG/5ML IV SOLN
INTRAVENOUS | Status: AC
  Filled 2015-01-23: qty 1

## 2015-01-23 MED ORDER — SODIUM CHLORIDE 0.9 % IV SOLN: 250.0000 mL | INTRAVENOUS | Status: DC

## 2015-01-23 MED ORDER — DEXMEDETOMIDINE HCL IN NACL 200 MCG/50ML IV SOLN
INTRAVENOUS | Status: DC | PRN
  Administered 2015-01-23: .5 ug/kg/h via INTRAVENOUS
  Administered 2015-01-23: 11:00:00 via INTRAVENOUS

## 2015-01-23 MED ORDER — LACTATED RINGERS IV SOLN
INTRAVENOUS | Status: DC | PRN
  Administered 2015-01-23 (×2): via INTRAVENOUS

## 2015-01-23 MED ORDER — CEFAZOLIN SODIUM 1-5 GM-% IV SOLN
1.0000 g | Freq: Three times a day (TID) | INTRAVENOUS | Status: AC
  Administered 2015-01-23 – 2015-01-24 (×2): 1 g via INTRAVENOUS
  Filled 2015-01-23 (×4): qty 50

## 2015-01-23 MED ORDER — MORPHINE SULFATE 2 MG/ML IJ SOLN: 4.0000 mg | INTRAMUSCULAR | Status: DC | PRN

## 2015-01-23 MED ORDER — DEXMEDETOMIDINE HCL IN NACL 200 MCG/50ML IV SOLN
INTRAVENOUS | Status: AC
  Filled 2015-01-23: qty 50

## 2015-01-23 MED ORDER — ONDANSETRON HCL 4 MG/2ML IJ SOLN
INTRAMUSCULAR | Status: DC | PRN
  Administered 2015-01-23: 4 mg via INTRAVENOUS

## 2015-01-23 MED ORDER — MIDAZOLAM HCL 2 MG/2ML IJ SOLN
INTRAMUSCULAR | Status: AC
  Filled 2015-01-23: qty 2

## 2015-01-23 MED ORDER — SODIUM CHLORIDE 0.9 % IV SOLN
INTRAVENOUS | Status: DC
  Administered 2015-01-23: 1000 mL via INTRAVENOUS
  Administered 2015-01-24: 01:00:00 via INTRAVENOUS

## 2015-01-23 MED ORDER — LIDOCAINE HCL (CARDIAC) 20 MG/ML IV SOLN
INTRAVENOUS | Status: AC
  Filled 2015-01-23: qty 5

## 2015-01-23 MED ORDER — BUPIVACAINE LIPOSOME 1.3 % IJ SUSP
INTRAMUSCULAR | Status: DC | PRN
  Administered 2015-01-23: 20 mL

## 2015-01-23 MED ORDER — DIPHENHYDRAMINE HCL 50 MG/ML IJ SOLN
12.5000 mg | Freq: Four times a day (QID) | INTRAMUSCULAR | Status: DC | PRN
  Filled 2015-01-23: qty 0.25

## 2015-01-23 MED ORDER — LIDOCAINE HCL (CARDIAC) 20 MG/ML IV SOLN
INTRAVENOUS | Status: DC | PRN
Start: 2015-01-23 — End: 2015-01-23
  Administered 2015-01-23: 60 mg via INTRAVENOUS

## 2015-01-23 MED ORDER — MIDAZOLAM HCL 2 MG/2ML IJ SOLN
1.0000 mg | INTRAMUSCULAR | Status: DC | PRN
  Administered 2015-01-23: 1 mg via INTRAVENOUS

## 2015-01-23 MED ORDER — DOCUSATE SODIUM 100 MG PO CAPS
100.0000 mg | ORAL_CAPSULE | Freq: Two times a day (BID) | ORAL | Status: DC
  Administered 2015-01-23 – 2015-01-25 (×4): 100 mg via ORAL
  Filled 2015-01-23 (×4): qty 1

## 2015-01-23 MED ORDER — VANCOMYCIN HCL 1000 MG IV SOLR
INTRAVENOUS | Status: DC | PRN
  Administered 2015-01-23 (×2): 1000 mg via TOPICAL

## 2015-01-23 MED ORDER — VANCOMYCIN HCL 1000 MG IV SOLR
INTRAVENOUS | Status: AC
  Filled 2015-01-23: qty 2000

## 2015-01-23 MED ORDER — SODIUM CHLORIDE 0.9 % IJ SOLN
3.0000 mL | Freq: Two times a day (BID) | INTRAMUSCULAR | Status: DC
  Administered 2015-01-24: 3 mL via INTRAVENOUS

## 2015-01-23 MED ORDER — GLYCOPYRROLATE 0.2 MG/ML IJ SOLN
INTRAMUSCULAR | Status: AC
  Filled 2015-01-23: qty 3

## 2015-01-23 MED ORDER — ONDANSETRON HCL 4 MG/2ML IJ SOLN
4.0000 mg | Freq: Four times a day (QID) | INTRAMUSCULAR | Status: DC | PRN
  Filled 2015-01-23: qty 2

## 2015-01-23 MED ORDER — EPHEDRINE SULFATE 50 MG/ML IJ SOLN
INTRAMUSCULAR | Status: DC | PRN
  Administered 2015-01-23 (×3): 5 mg via INTRAVENOUS

## 2015-01-23 MED ORDER — CYCLOBENZAPRINE HCL 10 MG PO TABS: 10.0000 mg | ORAL_TABLET | Freq: Three times a day (TID) | ORAL | Status: DC | PRN

## 2015-01-23 MED ORDER — NALOXONE HCL 0.4 MG/ML IJ SOLN
0.4000 mg | INTRAMUSCULAR | Status: DC | PRN
  Filled 2015-01-23: qty 1

## 2015-01-23 MED ORDER — PROPOFOL 10 MG/ML IV BOLUS
INTRAVENOUS | Status: DC | PRN
  Administered 2015-01-23: 20 mg via INTRAVENOUS
  Administered 2015-01-23: 180 mg via INTRAVENOUS

## 2015-01-23 MED ORDER — ARTIFICIAL TEARS OP OINT
TOPICAL_OINTMENT | OPHTHALMIC | Status: DC | PRN
  Administered 2015-01-23: 1 via OPHTHALMIC

## 2015-01-23 MED ORDER — 0.9 % SODIUM CHLORIDE (POUR BTL) OPTIME
TOPICAL | Status: DC | PRN
  Administered 2015-01-23: 1000 mL

## 2015-01-23 MED ORDER — THROMBIN 20000 UNITS EX SOLR
CUTANEOUS | Status: DC | PRN
  Administered 2015-01-23: 09:00:00 via TOPICAL

## 2015-01-23 MED ORDER — BUPIVACAINE LIPOSOME 1.3 % IJ SUSP
20.0000 mL | INTRAMUSCULAR | Status: AC
  Filled 2015-01-23: qty 20

## 2015-01-23 MED ORDER — HYDROMORPHONE HCL 1 MG/ML IJ SOLN
INTRAMUSCULAR | Status: AC
  Filled 2015-01-23: qty 1

## 2015-01-23 MED ORDER — ROCURONIUM BROMIDE 100 MG/10ML IV SOLN
INTRAVENOUS | Status: DC | PRN
  Administered 2015-01-23: 50 mg via INTRAVENOUS
  Administered 2015-01-23: 20 mg via INTRAVENOUS

## 2015-01-23 MED ORDER — MORPHINE SULFATE 4 MG/ML IJ SOLN
INTRAMUSCULAR | Status: AC
  Administered 2015-01-23: 4 mg
  Filled 2015-01-23: qty 1

## 2015-01-23 MED ORDER — LORAZEPAM 2 MG/ML IJ SOLN
1.0000 mg | Freq: Three times a day (TID) | INTRAMUSCULAR | Status: DC | PRN
  Administered 2015-01-24 – 2015-01-25 (×2): 1 mg via INTRAVENOUS
  Filled 2015-01-23 (×2): qty 1

## 2015-01-23 MED ORDER — HYDROMORPHONE HCL 1 MG/ML IJ SOLN
0.2500 mg | INTRAMUSCULAR | Status: DC | PRN
  Administered 2015-01-23 (×4): 0.5 mg via INTRAVENOUS

## 2015-01-23 MED ORDER — FENTANYL CITRATE (PF) 250 MCG/5ML IJ SOLN
INTRAMUSCULAR | Status: AC
  Filled 2015-01-23: qty 5

## 2015-01-23 MED ORDER — ZOLPIDEM TARTRATE 5 MG PO TABS
5.0000 mg | ORAL_TABLET | Freq: Every evening | ORAL | Status: DC | PRN
  Administered 2015-01-24: 5 mg via ORAL
  Filled 2015-01-23 (×2): qty 1

## 2015-01-23 MED ORDER — FENTANYL CITRATE (PF) 100 MCG/2ML IJ SOLN
INTRAMUSCULAR | Status: DC | PRN
  Administered 2015-01-23 (×7): 50 ug via INTRAVENOUS
  Administered 2015-01-23: 100 ug via INTRAVENOUS
  Administered 2015-01-23: 50 ug via INTRAVENOUS

## 2015-01-23 MED ORDER — SODIUM CHLORIDE 0.9 % IJ SOLN: 9.0000 mL | INTRAMUSCULAR | Status: DC | PRN

## 2015-01-23 MED ORDER — LEDIPASVIR-SOFOSBUVIR 90-400 MG PO TABS
1.0000 | ORAL_TABLET | Freq: Every day | ORAL | Status: DC
Start: 2015-01-23 — End: 2015-01-25
  Administered 2015-01-24: 1 via ORAL
  Filled 2015-01-23: qty 1

## 2015-01-23 MED ORDER — SODIUM CHLORIDE 0.9 % IJ SOLN
INTRAMUSCULAR | Status: AC
  Filled 2015-01-23: qty 10

## 2015-01-23 MED ORDER — FENTANYL 10 MCG/ML IV SOLN
INTRAVENOUS | Status: DC
  Administered 2015-01-23: 13:00:00 via INTRAVENOUS
  Administered 2015-01-23: 165 ug via INTRAVENOUS
  Filled 2015-01-23 (×2): qty 50

## 2015-01-23 MED ORDER — ONDANSETRON HCL 4 MG/2ML IJ SOLN: 4.0000 mg | Freq: Four times a day (QID) | INTRAMUSCULAR | Status: DC | PRN

## 2015-01-23 MED ORDER — EPHEDRINE SULFATE 50 MG/ML IJ SOLN
INTRAMUSCULAR | Status: AC
  Filled 2015-01-23: qty 1

## 2015-01-23 MED ORDER — MENTHOL 3 MG MT LOZG
1.0000 | LOZENGE | OROMUCOSAL | Status: DC | PRN
  Filled 2015-01-23: qty 9

## 2015-01-23 MED ORDER — HYDROMORPHONE 0.3 MG/ML IV SOLN
INTRAVENOUS | Status: DC
  Administered 2015-01-23: 17:00:00 via INTRAVENOUS
  Administered 2015-01-23: 4.2 mg via INTRAVENOUS
  Administered 2015-01-24: 09:00:00 via INTRAVENOUS
  Administered 2015-01-24: 7.5 mg via INTRAVENOUS
  Administered 2015-01-24: 18:00:00 via INTRAVENOUS
  Administered 2015-01-24: 4.32 mg via INTRAVENOUS
  Administered 2015-01-24: 2.7 mg via INTRAVENOUS
  Administered 2015-01-24: 3.3 mg via INTRAVENOUS
  Administered 2015-01-24: 4.5 mg via INTRAVENOUS
  Administered 2015-01-25 (×2): via INTRAVENOUS
  Filled 2015-01-23 (×6): qty 25

## 2015-01-23 MED ORDER — ONDANSETRON HCL 4 MG/2ML IJ SOLN: 4.0000 mg | INTRAMUSCULAR | Status: DC | PRN

## 2015-01-23 MED ORDER — DIPHENHYDRAMINE HCL 12.5 MG/5ML PO ELIX
12.5000 mg | ORAL_SOLUTION | Freq: Four times a day (QID) | ORAL | Status: DC | PRN
  Filled 2015-01-23: qty 5

## 2015-01-23 MED ORDER — MIDAZOLAM HCL 2 MG/2ML IJ SOLN
2.0000 mg | Freq: Once | INTRAMUSCULAR | Status: AC
  Administered 2015-01-23: 2 mg via INTRAVENOUS

## 2015-01-23 MED ORDER — OXYCODONE HCL 5 MG PO TABS
15.0000 mg | ORAL_TABLET | ORAL | Status: DC | PRN
  Administered 2015-01-23 – 2015-01-25 (×11): 15 mg via ORAL
  Filled 2015-01-23 (×11): qty 3

## 2015-01-23 MED ORDER — MIDAZOLAM HCL 5 MG/5ML IJ SOLN
INTRAMUSCULAR | Status: DC | PRN
  Administered 2015-01-23: 2 mg via INTRAVENOUS

## 2015-01-23 MED ORDER — SODIUM CHLORIDE 0.9 % IJ SOLN: 3.0000 mL | INTRAMUSCULAR | Status: DC | PRN

## 2015-01-23 MED ORDER — DIPHENHYDRAMINE HCL 50 MG/ML IJ SOLN
12.5000 mg | Freq: Four times a day (QID) | INTRAMUSCULAR | Status: DC | PRN
  Administered 2015-01-23: 12.5 mg via INTRAVENOUS
  Filled 2015-01-23: qty 1

## 2015-01-23 MED ORDER — ONDANSETRON HCL 4 MG/2ML IJ SOLN
INTRAMUSCULAR | Status: AC
  Filled 2015-01-23: qty 2

## 2015-01-23 MED ORDER — MIDAZOLAM HCL 2 MG/2ML IJ SOLN
INTRAMUSCULAR | Status: AC
  Administered 2015-01-23: 1 mg
  Filled 2015-01-23: qty 2

## 2015-01-23 MED ORDER — CYCLOBENZAPRINE HCL 10 MG PO TABS
ORAL_TABLET | ORAL | Status: AC
  Filled 2015-01-23: qty 1

## 2015-01-23 MED ORDER — NALOXONE HCL 0.4 MG/ML IJ SOLN: 0.4000 mg | INTRAMUSCULAR | Status: DC | PRN

## 2015-01-23 MED ORDER — NEOSTIGMINE METHYLSULFATE 10 MG/10ML IV SOLN
INTRAVENOUS | Status: AC
  Filled 2015-01-23: qty 1

## 2015-01-23 MED ORDER — PHENOL 1.4 % MT LIQD
1.0000 | OROMUCOSAL | Status: DC | PRN
  Filled 2015-01-23: qty 177

## 2015-01-23 MED ORDER — DEXAMETHASONE SODIUM PHOSPHATE 10 MG/ML IJ SOLN
INTRAMUSCULAR | Status: AC
  Filled 2015-01-23: qty 1

## 2015-01-23 MED ORDER — ARTIFICIAL TEARS OP OINT
TOPICAL_OINTMENT | OPHTHALMIC | Status: AC
  Filled 2015-01-23: qty 3.5

## 2015-01-23 MED FILL — Heparin Sodium (Porcine) Inj 1000 Unit/ML: INTRAMUSCULAR | Qty: 30 | Status: AC

## 2015-01-23 MED FILL — Sodium Chloride IV Soln 0.9%: INTRAVENOUS | Qty: 1000 | Status: AC

## 2015-01-23 SURGICAL SUPPLY — 71 items
BENZOIN TINCTURE PRP APPL 2/3 (GAUZE/BANDAGES/DRESSINGS) ×3 IMPLANT
BLADE CLIPPER SURG (BLADE) IMPLANT
BUR ACORN 6.0 (BURR) ×2 IMPLANT
BUR ACORN 6.0MM (BURR) ×1
BUR MATCHSTICK NEURO 3.0 LAGG (BURR) ×3 IMPLANT
CANISTER SUCT 3000ML PPV (MISCELLANEOUS) ×3 IMPLANT
CAP LOCKING THREADED (Cap) ×18 IMPLANT
CLOSURE WOUND 1/2 X4 (GAUZE/BANDAGES/DRESSINGS) ×1
CONT SPEC 4OZ CLIKSEAL STRL BL (MISCELLANEOUS) ×6 IMPLANT
COVER BACK TABLE 60X90IN (DRAPES) ×3 IMPLANT
CROSSLINK SPINAL FUSION (Cage) ×3 IMPLANT
DRAPE C-ARM 42X72 X-RAY (DRAPES) ×6 IMPLANT
DRAPE LAPAROTOMY 100X72X124 (DRAPES) ×3 IMPLANT
DRAPE POUCH INSTRU U-SHP 10X18 (DRAPES) ×3 IMPLANT
DRSG PAD ABDOMINAL 8X10 ST (GAUZE/BANDAGES/DRESSINGS) ×3 IMPLANT
DURAPREP 26ML APPLICATOR (WOUND CARE) ×3 IMPLANT
ELECT BLADE 4.0 EZ CLEAN MEGAD (MISCELLANEOUS) ×3
ELECT REM PT RETURN 9FT ADLT (ELECTROSURGICAL) ×3
ELECTRODE BLDE 4.0 EZ CLN MEGD (MISCELLANEOUS) ×1 IMPLANT
ELECTRODE REM PT RTRN 9FT ADLT (ELECTROSURGICAL) ×1 IMPLANT
EVACUATOR 1/8 PVC DRAIN (DRAIN) ×3 IMPLANT
GAUZE SPONGE 4X4 12PLY STRL (GAUZE/BANDAGES/DRESSINGS) ×3 IMPLANT
GAUZE SPONGE 4X4 16PLY XRAY LF (GAUZE/BANDAGES/DRESSINGS) ×3 IMPLANT
GLOVE BIO SURGEON STRL SZ8 (GLOVE) ×3 IMPLANT
GLOVE BIOGEL M 8.0 STRL (GLOVE) ×6 IMPLANT
GLOVE BIOGEL PI IND STRL 8 (GLOVE) ×2 IMPLANT
GLOVE BIOGEL PI INDICATOR 8 (GLOVE) ×4
GLOVE ECLIPSE 7.5 STRL STRAW (GLOVE) ×12 IMPLANT
GLOVE EXAM NITRILE LRG STRL (GLOVE) IMPLANT
GLOVE EXAM NITRILE MD LF STRL (GLOVE) IMPLANT
GLOVE EXAM NITRILE XL STR (GLOVE) IMPLANT
GLOVE EXAM NITRILE XS STR PU (GLOVE) IMPLANT
GLOVE INDICATOR 8.5 STRL (GLOVE) ×3 IMPLANT
GOWN STRL REUS W/ TWL LRG LVL3 (GOWN DISPOSABLE) ×2 IMPLANT
GOWN STRL REUS W/ TWL XL LVL3 (GOWN DISPOSABLE) ×1 IMPLANT
GOWN STRL REUS W/TWL 2XL LVL3 (GOWN DISPOSABLE) ×3 IMPLANT
GOWN STRL REUS W/TWL LRG LVL3 (GOWN DISPOSABLE) ×4
GOWN STRL REUS W/TWL XL LVL3 (GOWN DISPOSABLE) ×2
KIT BASIN OR (CUSTOM PROCEDURE TRAY) ×3 IMPLANT
KIT INFUSE LRG II (Orthopedic Implant) ×3 IMPLANT
KIT ROOM TURNOVER OR (KITS) ×3 IMPLANT
MILL MEDIUM DISP (BLADE) ×3 IMPLANT
NEEDLE HYPO 18GX1.5 BLUNT FILL (NEEDLE) IMPLANT
NEEDLE HYPO 21X1.5 SAFETY (NEEDLE) ×3 IMPLANT
NEEDLE HYPO 25X1 1.5 SAFETY (NEEDLE) IMPLANT
NS IRRIG 1000ML POUR BTL (IV SOLUTION) ×3 IMPLANT
PACK LAMINECTOMY NEURO (CUSTOM PROCEDURE TRAY) ×3 IMPLANT
PAD ARMBOARD 7.5X6 YLW CONV (MISCELLANEOUS) ×9 IMPLANT
PATTIES SURGICAL .5 X1 (DISPOSABLE) IMPLANT
PATTIES SURGICAL .5 X3 (DISPOSABLE) IMPLANT
ROD CREO 60MM (Rod) ×6 IMPLANT
SCREW 6.5X45 (Screw) ×18 IMPLANT
SPACER RISE 8X22 11-17MM-15 (Spacer) ×6 IMPLANT
SPACER RISE 8X22 8-14MM-10 (Neuro Prosthesis/Implant) ×6 IMPLANT
SPONGE LAP 4X18 X RAY DECT (DISPOSABLE) ×3 IMPLANT
SPONGE NEURO XRAY DETECT 1X3 (DISPOSABLE) IMPLANT
SPONGE SURGIFOAM ABS GEL 100 (HEMOSTASIS) ×3 IMPLANT
STRIP CLOSURE SKIN 1/2X4 (GAUZE/BANDAGES/DRESSINGS) ×2 IMPLANT
SUT VIC AB 1 CT1 18XBRD ANBCTR (SUTURE) ×2 IMPLANT
SUT VIC AB 1 CT1 8-18 (SUTURE) ×4
SUT VIC AB 2-0 CP2 18 (SUTURE) ×6 IMPLANT
SUT VIC AB 3-0 SH 8-18 (SUTURE) ×3 IMPLANT
SYR 20CC LL (SYRINGE) ×3 IMPLANT
SYR 20ML ECCENTRIC (SYRINGE) ×3 IMPLANT
SYR 5ML LL (SYRINGE) IMPLANT
TAPE CLOTH SURG 4X10 WHT LF (GAUZE/BANDAGES/DRESSINGS) ×3 IMPLANT
TOWEL OR 17X24 6PK STRL BLUE (TOWEL DISPOSABLE) ×3 IMPLANT
TOWEL OR 17X26 10 PK STRL BLUE (TOWEL DISPOSABLE) ×3 IMPLANT
TRAY FOLEY CATH 14FRSI W/METER (CATHETERS) IMPLANT
TRAY FOLEY CATH 16FRSI W/METER (SET/KITS/TRAYS/PACK) ×3 IMPLANT
WATER STERILE IRR 1000ML POUR (IV SOLUTION) ×3 IMPLANT

## 2015-01-23 NOTE — Anesthesia Preprocedure Evaluation (Signed)
Anesthesia Evaluation  Patient identified by MRN, date of birth, ID band Patient awake    Reviewed: Allergy & Precautions, NPO status , Patient's Chart, lab work & pertinent test results  Airway Mallampati: II  TM Distance: >3 FB Neck ROM: Full    Dental  (+) Teeth Intact, Dental Advisory Given   Pulmonary  breath sounds clear to auscultation        Cardiovascular Rhythm:Regular Rate:Normal     Neuro/Psych    GI/Hepatic   Endo/Other    Renal/GU      Musculoskeletal   Abdominal   Peds  Hematology   Anesthesia Other Findings   Reproductive/Obstetrics                             Anesthesia Physical Anesthesia Plan  ASA: II  Anesthesia Plan: General   Post-op Pain Management:    Induction: Intravenous  Airway Management Planned: Oral ETT  Additional Equipment:   Intra-op Plan:   Post-operative Plan: Extubation in OR  Informed Consent: I have reviewed the patients History and Physical, chart, labs and discussed the procedure including the risks, benefits and alternatives for the proposed anesthesia with the patient or authorized representative who has indicated his/her understanding and acceptance.   Dental advisory given  Plan Discussed with: Anesthesiologist and CRNA  Anesthesia Plan Comments: (Lumbar spondylosis with chronic LBP  Hepatitis C treated with harvoni Bipolar disorder  Plan GA with oral ETT  Corey Gilbert )        Anesthesia Quick Evaluation

## 2015-01-23 NOTE — Progress Notes (Signed)
Pt voices concerns about pain control. Pt states last time it took a long time, about 5 hours to get his pain medication in order. Encouraged pt to talk to Dr Jeral FruitBotero upstairs about pain regimen.

## 2015-01-23 NOTE — Addendum Note (Signed)
Addendum  created 01/23/15 1442 by Kipp Broodavid Townes Fuhs, MD   Modules edited: Orders

## 2015-01-23 NOTE — Progress Notes (Signed)
Wasted 270 mcg IV fentanyl with RN Rod MaeJoanna Lindsay.

## 2015-01-23 NOTE — OR Nursing (Signed)
Pt upset in PACU, states that he was ignored, not being taken care of. He demanded that the boss of the unit take care of him. He stated that if he opened his eyes and saw "just a nurse" taking care of him he was going to go crazy. One of the PACU nurses told the patient that he did have the boss taking care of him, what did he want, the hospital president?  The patient was offended by this comment. Director of the PACU will be notified.

## 2015-01-23 NOTE — Anesthesia Procedure Notes (Signed)
Procedure Name: Intubation Date/Time: 01/23/2015 8:45 AM Performed by: Lanell MatarBAKER, Kamree Wiens M Pre-anesthesia Checklist: Patient identified, Timeout performed, Emergency Drugs available, Suction available and Patient being monitored Patient Re-evaluated:Patient Re-evaluated prior to inductionOxygen Delivery Method: Circle system utilized Preoxygenation: Pre-oxygenation with 100% oxygen Intubation Type: IV induction Ventilation: Mask ventilation without difficulty and Oral airway inserted - appropriate to patient size Laryngoscope Size: Hyacinth MeekerMiller and 2 Grade View: Grade I Tube type: Oral Tube size: 7.5 mm Number of attempts: 1 Airway Equipment and Method: Stylet Placement Confirmation: ETT inserted through vocal cords under direct vision,  breath sounds checked- equal and bilateral and positive ETCO2 Secured at: 22 cm Tube secured with: Tape Dental Injury: Teeth and Oropharynx as per pre-operative assessment

## 2015-01-23 NOTE — Transfer of Care (Signed)
Immediate Anesthesia Transfer of Care Note  Patient: Corey Gilbert  Procedure(s) Performed: Procedure(s) with comments: Lumbar four-five, Lumbar five-Sacral one Posterior lumbar interbody fusion (N/A) - L4-5 L5-S1 Posterior lumbar interbody fusion  Patient Location: PACU  Anesthesia Type:General  Level of Consciousness: awake, alert  and oriented  Airway & Oxygen Therapy: Patient Spontanous Breathing and Patient connected to nasal cannula oxygen  Post-op Assessment: Report given to RN, Post -op Vital signs reviewed and stable and Patient moving all extremities X 4  Post vital signs: Reviewed and stable  Last Vitals:  Filed Vitals:   01/23/15 0708  BP: 152/89  Pulse: 63  Temp: 36.5 C    Complications: No apparent anesthesia complications

## 2015-01-23 NOTE — Anesthesia Postprocedure Evaluation (Signed)
  Anesthesia Post-op Note  Patient: Corey Gilbert  Procedure(s) Performed: Procedure(s) with comments: Lumbar four-five, Lumbar five-Sacral one Posterior lumbar interbody fusion (N/A) - L4-5 L5-S1 Posterior lumbar interbody fusion  Patient Location: PACU  Anesthesia Type:General  Level of Consciousness: awake, alert  and oriented  Airway and Oxygen Therapy: Patient Spontanous Breathing and Patient connected to nasal cannula oxygen  Post-op Pain: mild  Post-op Assessment: Post-op Vital signs reviewed, Patient's Cardiovascular Status Stable, Respiratory Function Stable, Patent Airway and Pain level controlled  Post-op Vital Signs: stable  Last Vitals:  Filed Vitals:   01/23/15 1415  BP: 120/81  Pulse: 93  Temp:   Resp: 11    Complications: No apparent anesthesia complications

## 2015-01-24 NOTE — Progress Notes (Signed)
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 by this RN, MD paged to update, nursing will cont to monitor

## 2015-01-24 NOTE — Progress Notes (Signed)
Pt arrived to 4N31 from 3S, transported by 3S nurse and House coverage. Pt complaining of severe back pain and tearful.  Pt has PCA connected. Pt unset and wanted to be left alone. Pt made as comfortable as possible. RN will continue to monitor.

## 2015-01-24 NOTE — Progress Notes (Signed)
Orthopedic Tech Progress Note Patient Details:  Corey Gilbert 03-25-1966 [id number]  Patient ID: Corey Gilbert, male   DOB: 03-25-1966, 49 y.o.   MRN: [id number] RN stated that pt already has aspen lumbar fusion brace.  Nikki DomCrawford, Eugenie Harewood 01/24/2015, 7:54 AM

## 2015-01-24 NOTE — Progress Notes (Signed)
Patient ID: Corey Gilbert, male   Corey GoldsmithOB: 02-Apr-1966, 49 y.o.   MRN: [id number] Patient called dr Gerlene FeeKritzer who is on call wanting to go home asap. Came to see him but he declined the discharge which was going to be against medical advise. he still has the drain and now he is no total pain free like this am

## 2015-01-24 NOTE — Progress Notes (Signed)
Pt c/o of Hemovac being d/c  this am; dsg CDI,  pt cont to c/o of Hemovac, pt off monitor, this nurse entered pt room, pt  Observed up in BR voiding, pt non-verbal without s/s of pain while standing erect with positive body posture while voiding, this RN/N pt of presence in room, pt began to c/o increased pain and became verbally aggressive, pt received pt pain meds, pt c/o Hemovac dsg this RN obs intact dressing with red drainage, MD called to update will cont to monitor

## 2015-01-24 NOTE — Op Note (Signed)
[name] Kitchen:  [name], Kayce                ACCOUNT NO.:  [id number]  MEDICAL RECORD NO.:  [id number]  LOCATION:  3S08C                        FACILITY:  [facility]  PHYSICIAN:  Hilda LiasErnesto Jakiah Bienaime, M.D.   DATE OF BIRTH:  01-Nov-1965  DATE OF PROCEDURE:  01/23/2015 DATE OF DISCHARGE:                              OPERATIVE REPORT   PREOPERATIVE DIAGNOSES:  L5-S1 spondylolisthesis, L4-5 degenerative disk disease, chronic radiculopathy.  Status post diskectomy, L3-L4.  POSTOPERATIVE DIAGNOSES:  L5-S1 spondylolisthesis, L4-5 degenerative disk disease, chronic radiculopathy.  Status post diskectomy, L3-L4.  PROCEDURE:  Bilateral L4 and L5 laminectomy, bilateral L4 and L5 facetectomy.  Bilateral L4 and L5 diskectomy more than normal to introduce 4 expandable cages.  Pedicle screws at L4, L5, S1, posterolateral arthrodesis with BMP and autograft.  Cell Saver, C-arm.  SURGEON:  Hilda LiasErnesto Terris Germano, M.D.  ASSISTANT:  Donalee CitrinGary Cram, M.D.  CLINICAL HISTORY:  The patient was a gentleman who in the past underwent cervical fusion and lumbar diskectomy at L3-4.  For a few years, he had been complaining of worsening of the pain with radiation to both lower extremities associated with walking.  X-ray showed that he has spondylolisthesis at the level of the L5-S1 and degenerative disk disease at the level of L4-5.  The patient has failed conservative treatment including Pain Clinic.  Surgery was advised.  The patient knew the risks and benefits of the surgery.  DESCRIPTION OF PROCEDURE:  The patient was taken to the OR.  After intubation, he was positioned in a prone manner.  The back was cleaned with Betadine and later on with DuraPrep.  Drapes were applied.  Midline incision following the previous one was made from L3-4 down to L5-S1. Muscles were retracted all the way laterally until we were able to see the lateral aspect of the facet.  From then on, we thought that obviously he has quite a bit of loose of the  posterior arch of L5 and on removal of the spinous process and lamina, the facet was without any problem.  At the level of 4-5, the same procedure was done.  The patient had quite a bit of thickening of the yellow ligament and removal was achieved.  We entered the disk space at the level of 5-1 first in the right side and then in the left side and total gross diskectomy medial and lateral was achieved.  This was more than normal so we were to introduce 2 cages, which were expanded to 17 mm with a 15 degrees of lordosis.  The cages had a BMP and autograft inside.  The rest of the disk space at level of L5-1 was filled up with the same material.  At the level of 4-5, we proceeded with spinous process removal as well as lamina and the facet.  The disk was entered, it was quite narrow.  At the end, we were able to introduce 2 cages that were expanded to 15. The rest of the disk space again was filled up with autograft and BMP. X-ray showed good position of the cages.  Then, we made 6 holes at the level of 4, 5, and S1 pedicles.  We feel all 4 quadrants just  to be sure that we were surrounded by bone.  Six screws of 6.5 x 45 were introduced and kept in place with 2 rods.  Caps were used to keep the rods in place.  Cross-link from right to left was done.  Then, we went laterally and we removed the periosteum of the lateral aspect of the facet of 4-5 and the ala of the sacrum as well as the transverse process.  A mix of BMP and autograft was used for arthrodesis.  Prior to closure, we went back and I feel the foramen and we found good decompression with plenty of space for the L4, L5, and S1 nerve roots.  Valsalva maneuver twice was negative.  Then, a drain was left in the operative site.  Vancomycin was applied to the surgical wound.  Then, the wound was closed with Vicryl and Steri-Strips.          ______________________________ Hilda LiasErnesto Anitha Kreiser, M.D.     EB/MEDQ  D:  01/23/2015  T:   01/24/2015  Job:  [id number]

## 2015-01-24 NOTE — Clinical Social Work Note (Signed)
CSW Consult Acknowledged:   CSW received a consult for SNF placement. Per MD pt will go home. CSW will sign off.        Corey Gilbert, MSW, LCSWA [phone]

## 2015-01-24 NOTE — Progress Notes (Signed)
Pt now walking slightly different from this am with PT, pt c/o of break through pain, pt encouraged to leave brace off while in Bed, pt communicated will walk short distances and up to SOB/Chair as tol, pt verbalized understanding of education, nursing will cont to monitor

## 2015-01-24 NOTE — Evaluation (Signed)
Occupational Therapy Evaluation Patient Details Name: Randon Goldsmithlan Ledlow MRN: [id number] DOB: [birth year] Today's Date: 01/24/2015    History of Present Illness pt is a 49 y/o male admitted with radicular pain down both legs, s/p Bilateral L4 and L5 laminectomy, bilateral L4 and L5, facetectomy. Bilateral L4 and L5 diskectomy more than normal to introduce 4 expandable cages. Pedicle screws at L4, L5, S1,posterolateral arthrodesis with BMP and autograft.   Clinical Impression   Education complete regarding ADL activity s/p back surgery    Follow Up Recommendations  No OT follow up    Equipment Recommendations  None recommended by OT    Recommendations for Other Services       Precautions / Restrictions Precautions Precautions: Back Required Braces or Orthoses: Spinal Brace Spinal Brace: Lumbar corset;Applied in sitting position      Mobility Bed Mobility Overal bed mobility: Modified Independent Bed Mobility: Sidelying to Sit;Sit to Sidelying;Rolling Rolling: Modified independent (Device/Increase time) Sidelying to sit: Modified independent (Device/Increase time)     Sit to sidelying: Modified independent (Device/Increase time) General bed mobility comments: Educated/practiced log roll and transition from side to /from sitting  Transfers Overall transfer level: Needs assistance   Transfers: Sit to/from Stand Sit to Stand: Supervision         General transfer comment: safe transition,     Balance Overall balance assessment: No apparent balance deficits (not formally assessed)                                          ADL Overall ADL's : At baseline                                       General ADL Comments: pt feels he is close to baseline with ADL activity . Educated in regards to back precautions with ADL activity,  Pt able to verbalize understanding. No further OT needed     Vision     Perception     Praxis       Pertinent Vitals/Pain Pain Assessment: Faces Faces Pain Scale: Hurts little more Pain Location: back Pain Descriptors / Indicators: Aching Pain Intervention(s): Monitored during session     Hand Dominance     Extremity/Trunk Assessment Upper Extremity Assessment Upper Extremity Assessment: Overall WFL for tasks assessed   Lower Extremity Assessment Lower Extremity Assessment: Overall WFL for tasks assessed   Cervical / Trunk Assessment Cervical / Trunk Assessment: Normal   Communication Communication Communication: No difficulties   Cognition Arousal/Alertness: Awake/alert Behavior During Therapy: WFL for tasks assessed/performed Overall Cognitive Status: Within Functional Limits for tasks assessed                                Home Living Family/patient expects to be discharged to:: Private residence Living Arrangements: Spouse/significant other;Children Available Help at Discharge: Family;Other (Comment) (wife back to work  in 4 days) Type of Home: House Home Access: Level entry     Home Layout: Two level;Full bath on main level;Bed/bath upstairs (1/2 bath on the 2 floor.)               Home Equipment: Cane - single point          Prior Functioning/Environment Level of Independence: Independent  OT Goals(Current goals can be found in the care plan section) Acute Rehab OT Goals Patient Stated Goal: independent OT Goal Formulation: With patient  OT Frequency:     Barriers to D/C:            Co-evaluation              End of Session Nurse Communication: Mobility status  Activity Tolerance: Patient tolerated treatment well Patient left: in bed;with call bell/phone within reach   Time: 1055-1110 OT Time Calculation (min): 15 min Charges:  OT General Charges $OT Visit: 1 Procedure OT Evaluation $Initial OT Evaluation Tier I: 1 Procedure G-Codes:    Alba CoryEDDING, Christopherjohn Schiele D 01/24/2015, 12:15 PM

## 2015-01-24 NOTE — Progress Notes (Signed)
Patient ID: Corey Gilbert, male   DOB: [birth year], 49 y.o.   MRN: [id number] He tells me he feels great. Ready to go home. hemovac still draining. Foley was removed few minutes ago. To 4 th [place] today

## 2015-01-24 NOTE — Care Management Note (Signed)
Case Management Note  Patient Details  Name: Randon Goldsmithlan Spradley MRN: [id number] Date of Birth: 11/10/1965  Subjective/Objective:       Pt admitted s/p lumbar laminectomy, bilateral L4 and L5 facetectomy. Bilateral L4 and L5 diskectomy              Action/Plan:  PTA pt lived at home with spouse- NCM to follow for d/c recommendations- PT/OT pending Expected Discharge Date:  01/27/15               Expected Discharge Plan:  Home w Home Health Services  In-House Referral:     Discharge planning Services  CM Consult  Post Acute Care Choice:    Choice offered to:     DME Arranged:    DME Agency:     HH Arranged:    HH Agency:     Status of Service:  In process, will continue to follow  Medicare Important Message Given:    Date Medicare IM Given:    Medicare IM give by:    Date Additional Medicare IM Given:    Additional Medicare Important Message give by:     If discussed at Long Length of Stay Meetings, dates discussed:    Additional Comments:   Darrold SpanWebster, Yashira Offenberger Hall, RN 01/24/2015, 3:58 PM

## 2015-01-24 NOTE — Evaluation (Signed)
Physical Therapy Evaluation Patient Details Name: Corey Gilbert MRN: [id number] DOB: 1966/09/02 Today's Date: 01/24/2015   History of Present Illness  pt is a 49 y/o male admitted with radicular pain down both legs, s/p Bilateral L4 and L5 laminectomy, bilateral L4 and L5, facetectomy. Bilateral L4 and L5 diskectomy more than normal to introduce 4 expandable cages. Pedicle screws at L4, L5, S1,posterolateral arthrodesis with BMP and autograft.  Clinical Impression  Pt admitted with/for lumbar fusion surgery described above.  Pt currently limited functionally due to the problems listed below.  (see problems list.)  Pt will benefit from PT to maximize function and safety to be able to get home safely with available assist of family.     Follow Up Recommendations No PT follow up    Equipment Recommendations  None recommended by PT    Recommendations for Other Services       Precautions / Restrictions Precautions Precautions: Back Required Braces or Orthoses: Spinal Brace Spinal Brace: Lumbar corset;Applied in sitting position      Mobility  Bed Mobility Overal bed mobility: Modified Independent Bed Mobility: Sidelying to Sit;Sit to Sidelying;Rolling Rolling: Modified independent (Device/Increase time) Sidelying to sit: Modified independent (Device/Increase time)     Sit to sidelying: Modified independent (Device/Increase time) General bed mobility comments: Educated/practiced log roll and transition from side to /from sitting  Transfers Overall transfer level: Needs assistance   Transfers: Sit to/from Stand Sit to Stand: Supervision         General transfer comment: safe transition,   Ambulation/Gait Ambulation/Gait assistance: Supervision Ambulation Distance (Feet): 600 Feet Assistive device: None (versus iv pole) Gait Pattern/deviations: Step-through pattern   Gait velocity interpretation: at or above normal speed for age/gender General Gait Details: generally  steady, functional speed, mild steppage type gait.  Stairs            Wheelchair Mobility    Modified Rankin (Stroke Patients Only)       Balance Overall balance assessment: No apparent balance deficits (not formally assessed)                                           Pertinent Vitals/Pain Pain Assessment: Faces Faces Pain Scale: Hurts little more Pain Location: back Pain Descriptors / Indicators: Aching Pain Intervention(s): Other (comment);Monitored during session (pushing PCA)    Home Living Family/patient expects to be discharged to:: Private residence Living Arrangements: Spouse/significant other;Children Available Help at Discharge: Family;Other (Comment) (wife back to work  in 4 days) Type of Home: House Home Access: Level entry     Home Layout: Two level;Full bath on main level;Bed/bath upstairs (1/2 bath on the 2 floor.) Home Equipment: Cane - single point      Prior Function Level of Independence: Independent               Hand Dominance        Extremity/Trunk Assessment   Upper Extremity Assessment: Overall WFL for tasks assessed           Lower Extremity Assessment: Overall WFL for tasks assessed      Cervical / Trunk Assessment: Normal  Communication   Communication: No difficulties  Cognition Arousal/Alertness: Awake/alert Behavior During Therapy: WFL for tasks assessed/performed Overall Cognitive Status: Within Functional Limits for tasks assessed  General Comments General comments (skin integrity, edema, etc.): Educated of back care/prec, log roll, bracing issues, lifting restrictions, progression of activity.    Exercises        Assessment/Plan    PT Assessment Patient needs continued PT services  PT Diagnosis Acute pain   PT Problem List Decreased knowledge of precautions;Decreased mobility  PT Treatment Interventions Gait training;Stair training;Functional mobility  training;Therapeutic activities;Patient/family education   PT Goals (Current goals can be found in the Care Plan section) Acute Rehab PT Goals Patient Stated Goal: independent PT Goal Formulation: With patient Time For Goal Achievement: 01/28/15 Potential to Achieve Goals: Good    Frequency Min 5X/week   Barriers to discharge        Co-evaluation               End of Session   Activity Tolerance: Patient tolerated treatment well Patient left: in chair;with call bell/phone within reach;with family/visitor present Nurse Communication: Mobility status         Time: 1000-1032 PT Time Calculation (min) (ACUTE ONLY): 32 min   Charges:   PT Evaluation $Initial PT Evaluation Tier I: 1 Procedure PT Treatments $Gait Training: 8-22 mins   PT G Codes:        Ellyse Rotolo, Eliseo GumKenneth V 01/24/2015, 12:14 PM 01/24/2015   BingKen Alazae Crymes, PT [phone]  (pager)

## 2015-01-24 NOTE — Progress Notes (Signed)
Utilization review completed.  

## 2015-01-25 MED ORDER — HYDROMORPHONE HCL 1 MG/ML IJ SOLN: 0.5000 mg | INTRAMUSCULAR | Status: DC | PRN

## 2015-01-25 MED ORDER — FLEET ENEMA 7-19 GM/118ML RE ENEM: 1.0000 | ENEMA | Freq: Every day | RECTAL | Status: DC | PRN

## 2015-01-25 NOTE — Progress Notes (Signed)
Patient DC'd home via car with family.  DC instructions given and fully understood.  Vital signs and assessments were stable.

## 2015-01-25 NOTE — Progress Notes (Signed)
Hemovacc removed per MD ordered.  Patient tolerated well.

## 2015-01-25 NOTE — Progress Notes (Signed)
Patient ID: Corey Gilbert, male   DOB: [birth year], 49 y.o.   MRN: [id number] Resting . Still c/o pai, no weakness. Spoke with wife . Both agree to changes pain medications

## 2015-01-25 NOTE — Discharge Summary (Signed)
Physician Discharge Summary  Patient ID: [name] MRN: [id number] DOB/AGE: [birth year] 49 y.o.  Admit date: 01/23/2015 Discharge date: 01/25/2015  Admission Diagnoses:l45,l5s1 ddd spondylolisthesis  Discharge Diagnoses:  Active Problems:   Spondylolisthesis of lumbar region   Discharged Condition: no pain  Hospital Course: surgery  Consults: none  Significant Diagnostic Studies: mri  Treatments: lumbar fusion  Discharge Exam: Blood pressure 161/99, pulse 85, temperature 98.4 F (36.9 C), temperature source Oral, resp. rate 16, height 5\' 10"  (1.778 m), weight 83.008 kg (183 lb), SpO2 94 %. Ambulating, no pia  Disposition:called by RN 3 times . Patient wants to go home. Advised to stay since he had the drain removed this am. Can not convince him     Medication List    ASK your doctor about these medications        aspirin-acetaminophen-caffeine 250-250-65 MG per tablet  Commonly known as:  EXCEDRIN MIGRAINE  Take 1 tablet by mouth every 6 (six) hours as needed for migraine.     Ledipasvir-Sofosbuvir 90-400 MG Tabs  Take 1 tablet by mouth daily.     multivitamin with minerals Tabs tablet  Take 1 tablet by mouth daily.     Oxycodone HCl 10 MG Tabs  Take 10 mg by mouth 5 (five) times daily.         Signed: Karn CassisBOTERO,Tylik Treese M 01/25/2015, 1:40 PM

## 2015-01-25 NOTE — Progress Notes (Signed)
Pt on a PCA pump with iv dilaudid full dose infusing but refusing to wear the End Tidal CO2 and oxygen monitor,Dr Kritzer (on call) paged and notified, called back and said to DC the PCA and place pt on oral dilaudid 4mg  every 3hrs if pt continues to refuse monitoring, same related back to pt and his wife at bedside, wife pleaded to appeal to pt which he eventually agreed to put monitor tubes on and both sensor machines turned back on, pt was however reassured and will continue to monitor. Obasogie-Asidi, [place] Efe

## 2015-02-16 ENCOUNTER — Encounter (HOSPITAL_COMMUNITY)
Admission: EM | Disposition: A | Discharge status: Home / Self Care | Payer: Self-pay | Source: Home / Self Care | Attending: Neurosurgery

## 2015-02-16 ENCOUNTER — Encounter (HOSPITAL_COMMUNITY): Payer: Self-pay | Admitting: Emergency Medicine

## 2015-02-16 ENCOUNTER — Observation Stay (HOSPITAL_COMMUNITY): Payer: Medicaid Other | Admitting: Anesthesiology

## 2015-02-16 ENCOUNTER — Inpatient Hospital Stay (HOSPITAL_COMMUNITY)
Admission: EM | Admit: 2015-02-16 | Discharge: 2015-02-21 | DRG: 863 | Disposition: A | Discharge status: Home / Self Care | Payer: Medicaid Other | Attending: Neurosurgery | Admitting: Neurosurgery

## 2015-02-16 DIAGNOSIS — T8149XA Infection following a procedure, other surgical site, initial encounter: Secondary | ICD-10-CM | POA: Diagnosis present

## 2015-02-16 DIAGNOSIS — F329 Major depressive disorder, single episode, unspecified: Secondary | ICD-10-CM | POA: Diagnosis present

## 2015-02-16 DIAGNOSIS — Z7982 Long term (current) use of aspirin: Secondary | ICD-10-CM

## 2015-02-16 DIAGNOSIS — B1920 Unspecified viral hepatitis C without hepatic coma: Secondary | ICD-10-CM | POA: Diagnosis present

## 2015-02-16 DIAGNOSIS — Z981 Arthrodesis status: Secondary | ICD-10-CM | POA: Diagnosis not present

## 2015-02-16 DIAGNOSIS — F319 Bipolar disorder, unspecified: Secondary | ICD-10-CM | POA: Diagnosis present

## 2015-02-16 DIAGNOSIS — Z888 Allergy status to other drugs, medicaments and biological substances status: Secondary | ICD-10-CM | POA: Diagnosis not present

## 2015-02-16 DIAGNOSIS — Z886 Allergy status to analgesic agent status: Secondary | ICD-10-CM

## 2015-02-16 DIAGNOSIS — M549 Dorsalgia, unspecified: Secondary | ICD-10-CM | POA: Diagnosis present

## 2015-02-16 DIAGNOSIS — B9561 Methicillin susceptible Staphylococcus aureus infection as the cause of diseases classified elsewhere: Secondary | ICD-10-CM | POA: Diagnosis present

## 2015-02-16 DIAGNOSIS — M1990 Unspecified osteoarthritis, unspecified site: Secondary | ICD-10-CM | POA: Diagnosis present

## 2015-02-16 DIAGNOSIS — Z79899 Other long term (current) drug therapy: Secondary | ICD-10-CM

## 2015-02-16 DIAGNOSIS — Y848 Other medical procedures as the cause of abnormal reaction of the patient, or of later complication, without mention of misadventure at the time of the procedure: Secondary | ICD-10-CM | POA: Diagnosis not present

## 2015-02-16 DIAGNOSIS — T814XXA Infection following a procedure, initial encounter: Secondary | ICD-10-CM | POA: Diagnosis present

## 2015-02-16 DIAGNOSIS — G062 Extradural and subdural abscess, unspecified: Secondary | ICD-10-CM

## 2015-02-16 DIAGNOSIS — T798XXA Other early complications of trauma, initial encounter: Secondary | ICD-10-CM | POA: Diagnosis present

## 2015-02-16 DIAGNOSIS — Z79891 Long term (current) use of opiate analgesic: Secondary | ICD-10-CM | POA: Diagnosis not present

## 2015-02-16 HISTORY — PX: LUMBAR WOUND DEBRIDEMENT: SHX1988

## 2015-02-16 LAB — PROTIME-INR
INR: 1.07 (ref 0.00–1.49)
PROTHROMBIN TIME: 14.1 s (ref 11.6–15.2)

## 2015-02-16 LAB — BASIC METABOLIC PANEL
Anion gap: 10 (ref 5–15)
BUN: 9 mg/dL (ref 6–20)
CALCIUM: 9.4 mg/dL (ref 8.9–10.3)
CHLORIDE: 99 mmol/L — AB (ref 101–111)
CO2: 28 mmol/L (ref 22–32)
CREATININE: 0.81 mg/dL (ref 0.61–1.24)
GFR calc Af Amer: >60 mL/min (ref >60)
Glucose, Bld: 92 mg/dL (ref 65–99)
Potassium: 3.6 mmol/L (ref 3.5–5.1)
Sodium: 137 mmol/L (ref 135–145)

## 2015-02-16 LAB — GRAM STAIN

## 2015-02-16 LAB — CBC WITH DIFFERENTIAL/PLATELET
BASOS ABS: 0 10*3/uL (ref 0.0–0.1)
Basophils Relative: 0 % (ref 0–1)
EOS ABS: 0.2 10*3/uL (ref 0.0–0.7)
Eosinophils Relative: 1 % (ref 0–5)
HCT: 39.3 % (ref 39.0–52.0)
HEMOGLOBIN: 13.3 g/dL (ref 13.0–17.0)
LYMPHS ABS: 1.5 10*3/uL (ref 0.7–4.0)
Lymphocytes Relative: 12 % (ref 12–46)
MCH: 29.7 pg (ref 26.0–34.0)
MCHC: 33.8 g/dL (ref 30.0–36.0)
MCV: 87.7 fL (ref 78.0–100.0)
MONO ABS: 1.4 10*3/uL — AB (ref 0.1–1.0)
MONOS PCT: 12 % (ref 3–12)
NEUTROS ABS: 9.2 10*3/uL — AB (ref 1.7–7.7)
Neutrophils Relative %: 75 % (ref 43–77)
Platelets: 138 10*3/uL — ABNORMAL LOW (ref 150–400)
RBC: 4.48 MIL/uL (ref 4.22–5.81)
RDW: 13.2 % (ref 11.5–15.5)
WBC: 12.3 10*3/uL — AB (ref 4.0–10.5)

## 2015-02-16 LAB — SEDIMENTATION RATE: SED RATE: 31 mm/h — AB (ref 0–16)

## 2015-02-16 SURGERY — LUMBAR WOUND DEBRIDEMENT
Anesthesia: General | Site: Back

## 2015-02-16 MED ORDER — LEDIPASVIR-SOFOSBUVIR 90-400 MG PO TABS: 1.0000 | ORAL_TABLET | Freq: Every evening | ORAL | Status: DC

## 2015-02-16 MED ORDER — OXYCODONE HCL 5 MG PO TABS: 5.0000 mg | ORAL_TABLET | ORAL | Status: DC | PRN

## 2015-02-16 MED ORDER — MIDAZOLAM HCL 2 MG/2ML IJ SOLN
INTRAMUSCULAR | Status: DC | PRN
  Administered 2015-02-16: 2 mg via INTRAVENOUS

## 2015-02-16 MED ORDER — OXYCODONE HCL 5 MG PO TABS: 15.0000 mg | ORAL_TABLET | ORAL | Status: DC | PRN

## 2015-02-16 MED ORDER — HYDROMORPHONE HCL 1 MG/ML IJ SOLN
1.0000 mg | Freq: Once | INTRAMUSCULAR | Status: AC
  Administered 2015-02-16: 1 mg via INTRAMUSCULAR
  Filled 2015-02-16: qty 1

## 2015-02-16 MED ORDER — ROCURONIUM BROMIDE 50 MG/5ML IV SOLN
INTRAVENOUS | Status: AC
Start: 2015-02-16 — End: 2015-02-16
  Filled 2015-02-16: qty 1

## 2015-02-16 MED ORDER — SCOPOLAMINE 1 MG/3DAYS TD PT72
MEDICATED_PATCH | TRANSDERMAL | Status: AC
  Administered 2015-02-16: 1 via TRANSDERMAL
  Filled 2015-02-16: qty 1

## 2015-02-16 MED ORDER — DIAZEPAM 5 MG PO TABS: 5.0000 mg | ORAL_TABLET | Freq: Four times a day (QID) | ORAL | Status: DC | PRN

## 2015-02-16 MED ORDER — OXYCODONE HCL 5 MG PO TABS
15.0000 mg | ORAL_TABLET | ORAL | Status: DC | PRN
  Administered 2015-02-16 – 2015-02-21 (×25): 15 mg via ORAL
  Filled 2015-02-16 (×25): qty 3

## 2015-02-16 MED ORDER — ONDANSETRON HCL 4 MG/2ML IJ SOLN: 4.0000 mg | Freq: Four times a day (QID) | INTRAMUSCULAR | Status: DC | PRN

## 2015-02-16 MED ORDER — OXYCODONE HCL 5 MG PO TABS
5.0000 mg | ORAL_TABLET | Freq: Once | ORAL | Status: AC
  Administered 2015-02-16: 5 mg via ORAL
  Filled 2015-02-16: qty 1

## 2015-02-16 MED ORDER — ONDANSETRON HCL 4 MG/2ML IJ SOLN
INTRAMUSCULAR | Status: AC
  Filled 2015-02-16: qty 2

## 2015-02-16 MED ORDER — ONDANSETRON HCL 4 MG/2ML IJ SOLN
INTRAMUSCULAR | Status: DC | PRN
  Administered 2015-02-16: 4 mg via INTRAVENOUS

## 2015-02-16 MED ORDER — ROCURONIUM BROMIDE 100 MG/10ML IV SOLN
INTRAVENOUS | Status: DC | PRN
  Administered 2015-02-16: 50 mg via INTRAVENOUS

## 2015-02-16 MED ORDER — LIDOCAINE HCL (CARDIAC) 20 MG/ML IV SOLN
INTRAVENOUS | Status: AC
  Filled 2015-02-16: qty 5

## 2015-02-16 MED ORDER — DEXMEDETOMIDINE HCL IN NACL 200 MCG/50ML IV SOLN
INTRAVENOUS | Status: DC | PRN
  Administered 2015-02-16: 83 ug via INTRAVENOUS

## 2015-02-16 MED ORDER — ADULT MULTIVITAMIN W/MINERALS CH
1.0000 | ORAL_TABLET | Freq: Every day | ORAL | Status: DC
  Administered 2015-02-17 – 2015-02-21 (×5): 1 via ORAL
  Filled 2015-02-16 (×5): qty 1

## 2015-02-16 MED ORDER — POTASSIUM CHLORIDE IN NACL 20-0.9 MEQ/L-% IV SOLN
INTRAVENOUS | Status: DC
  Administered 2015-02-16 – 2015-02-21 (×2): via INTRAVENOUS
  Filled 2015-02-16 (×3): qty 1000

## 2015-02-16 MED ORDER — CYCLOBENZAPRINE HCL 10 MG PO TABS
10.0000 mg | ORAL_TABLET | Freq: Three times a day (TID) | ORAL | Status: DC | PRN
  Administered 2015-02-16 – 2015-02-21 (×7): 10 mg via ORAL
  Filled 2015-02-16 (×7): qty 1

## 2015-02-16 MED ORDER — VANCOMYCIN HCL IN DEXTROSE 1-5 GM/200ML-% IV SOLN
1000.0000 mg | Freq: Three times a day (TID) | INTRAVENOUS | Status: DC
  Administered 2015-02-17 – 2015-02-19 (×8): 1000 mg via INTRAVENOUS
  Filled 2015-02-16 (×9): qty 200

## 2015-02-16 MED ORDER — VANCOMYCIN HCL IN DEXTROSE 1-5 GM/200ML-% IV SOLN
INTRAVENOUS | Status: AC
Start: 2015-02-16 — End: 2015-02-16
  Administered 2015-02-16: 1000 mg via INTRAVENOUS
  Filled 2015-02-16: qty 200

## 2015-02-16 MED ORDER — DEXMEDETOMIDINE HCL IN NACL 200 MCG/50ML IV SOLN
INTRAVENOUS | Status: DC | PRN
  Administered 2015-02-16: 0.7 ug/kg/h via INTRAVENOUS

## 2015-02-16 MED ORDER — VANCOMYCIN HCL 500 MG IV SOLR
500.0000 mg | Freq: Once | INTRAVENOUS | Status: AC
  Administered 2015-02-16: 500 mg via INTRAVENOUS
  Filled 2015-02-16: qty 500

## 2015-02-16 MED ORDER — SODIUM CHLORIDE 0.9 % IJ SOLN: 3.0000 mL | Freq: Two times a day (BID) | INTRAMUSCULAR | Status: DC

## 2015-02-16 MED ORDER — HYDROMORPHONE HCL 1 MG/ML IJ SOLN: 0.2500 mg | INTRAMUSCULAR | Status: DC | PRN

## 2015-02-16 MED ORDER — LIDOCAINE HCL (CARDIAC) 20 MG/ML IV SOLN
INTRAVENOUS | Status: DC | PRN
  Administered 2015-02-16: 80 mg via INTRAVENOUS

## 2015-02-16 MED ORDER — OXYCODONE HCL 5 MG/5ML PO SOLN: 5.0000 mg | Freq: Once | ORAL | Status: DC | PRN

## 2015-02-16 MED ORDER — THROMBIN 20000 UNITS EX SOLR
CUTANEOUS | Status: DC | PRN
Start: 2015-02-16 — End: 2015-02-16
  Administered 2015-02-16: 20 mL via TOPICAL

## 2015-02-16 MED ORDER — PROPOFOL 10 MG/ML IV BOLUS
INTRAVENOUS | Status: DC | PRN
  Administered 2015-02-16: 200 mg via INTRAVENOUS

## 2015-02-16 MED ORDER — OXYCODONE HCL 5 MG PO TABS: 5.0000 mg | ORAL_TABLET | Freq: Once | ORAL | Status: DC | PRN

## 2015-02-16 MED ORDER — ONDANSETRON HCL 4 MG PO TABS: 4.0000 mg | ORAL_TABLET | Freq: Four times a day (QID) | ORAL | Status: DC | PRN

## 2015-02-16 MED ORDER — PROPOFOL 10 MG/ML IV BOLUS
INTRAVENOUS | Status: AC
  Filled 2015-02-16: qty 20

## 2015-02-16 MED ORDER — FENTANYL CITRATE (PF) 250 MCG/5ML IJ SOLN
INTRAMUSCULAR | Status: DC | PRN
  Administered 2015-02-16: 100 ug via INTRAVENOUS
  Administered 2015-02-16 (×2): 50 ug via INTRAVENOUS
  Administered 2015-02-16: 100 ug via INTRAVENOUS
  Administered 2015-02-16: 25 ug via INTRAVENOUS
  Administered 2015-02-16 (×2): 50 ug via INTRAVENOUS

## 2015-02-16 MED ORDER — SODIUM CHLORIDE 0.9 % IJ SOLN: 3.0000 mL | INTRAMUSCULAR | Status: DC | PRN

## 2015-02-16 MED ORDER — MIDAZOLAM HCL 2 MG/2ML IJ SOLN
INTRAMUSCULAR | Status: AC
  Filled 2015-02-16: qty 2

## 2015-02-16 MED ORDER — FENTANYL CITRATE (PF) 250 MCG/5ML IJ SOLN
INTRAMUSCULAR | Status: AC
  Filled 2015-02-16: qty 5

## 2015-02-16 MED ORDER — HYDROMORPHONE HCL 1 MG/ML IJ SOLN
1.0000 mg | Freq: Once | INTRAMUSCULAR | Status: AC
  Administered 2015-02-16: 1 mg via INTRAVENOUS
  Filled 2015-02-16: qty 1

## 2015-02-16 MED ORDER — 0.9 % SODIUM CHLORIDE (POUR BTL) OPTIME
TOPICAL | Status: DC | PRN
  Administered 2015-02-16 (×4): 1000 mL

## 2015-02-16 MED ORDER — SODIUM CHLORIDE 0.9 % IV SOLN: 250.0000 mL | INTRAVENOUS | Status: DC | PRN

## 2015-02-16 MED ORDER — LACTATED RINGERS IV SOLN
INTRAVENOUS | Status: DC | PRN
  Administered 2015-02-16 (×2): via INTRAVENOUS

## 2015-02-16 MED ORDER — HYDROMORPHONE HCL 1 MG/ML IJ SOLN
1.0000 mg | INTRAMUSCULAR | Status: DC | PRN
  Administered 2015-02-16 – 2015-02-19 (×35): 1 mg via INTRAVENOUS
  Filled 2015-02-16 (×35): qty 1

## 2015-02-16 MED ORDER — DEXMEDETOMIDINE HCL IN NACL 200 MCG/50ML IV SOLN
INTRAVENOUS | Status: AC
  Filled 2015-02-16: qty 50

## 2015-02-16 MED ORDER — DEXMEDETOMIDINE HCL IN NACL 200 MCG/50ML IV SOLN
INTRAVENOUS | Status: AC
Start: 2015-02-16 — End: 2015-02-16
  Filled 2015-02-16: qty 50

## 2015-02-16 SURGICAL SUPPLY — 65 items
BAG DECANTER FOR FLEXI CONT (MISCELLANEOUS) ×3 IMPLANT
BENZOIN TINCTURE PRP APPL 2/3 (GAUZE/BANDAGES/DRESSINGS) IMPLANT
BLADE CLIPPER SURG (BLADE) IMPLANT
CANISTER SUCT 3000ML PPV (MISCELLANEOUS) ×3 IMPLANT
CLOSURE WOUND 1/2 X4 (GAUZE/BANDAGES/DRESSINGS)
CONT SPEC 4OZ CLIKSEAL STRL BL (MISCELLANEOUS) ×6 IMPLANT
DECANTER SPIKE VIAL GLASS SM (MISCELLANEOUS) ×3 IMPLANT
DRAPE LAPAROTOMY 100X72X124 (DRAPES) ×3 IMPLANT
DRAPE POUCH INSTRU U-SHP 10X18 (DRAPES) ×3 IMPLANT
DRAPE SURG 17X23 STRL (DRAPES) ×3 IMPLANT
DRSG VAC ATS MED SENSATRAC (GAUZE/BANDAGES/DRESSINGS) ×3 IMPLANT
DURAPREP 26ML APPLICATOR (WOUND CARE) ×3 IMPLANT
ELECT REM PT RETURN 9FT ADLT (ELECTROSURGICAL) ×3
ELECTRODE REM PT RTRN 9FT ADLT (ELECTROSURGICAL) ×1 IMPLANT
GAUZE SPONGE 4X4 12PLY STRL (GAUZE/BANDAGES/DRESSINGS) IMPLANT
GAUZE SPONGE 4X4 16PLY XRAY LF (GAUZE/BANDAGES/DRESSINGS) IMPLANT
GLOVE BIO SURGEON STRL SZ 6.5 (GLOVE) IMPLANT
GLOVE BIO SURGEON STRL SZ7 (GLOVE) ×3 IMPLANT
GLOVE BIO SURGEON STRL SZ7.5 (GLOVE) IMPLANT
GLOVE BIO SURGEON STRL SZ8 (GLOVE) IMPLANT
GLOVE BIO SURGEON STRL SZ8.5 (GLOVE) IMPLANT
GLOVE BIO SURGEONS STRL SZ 6.5 (GLOVE)
GLOVE BIOGEL M 8.0 STRL (GLOVE) IMPLANT
GLOVE BIOGEL PI IND STRL 7.5 (GLOVE) ×1 IMPLANT
GLOVE BIOGEL PI INDICATOR 7.5 (GLOVE) ×2
GLOVE ECLIPSE 6.5 STRL STRAW (GLOVE) ×6 IMPLANT
GLOVE ECLIPSE 7.0 STRL STRAW (GLOVE) IMPLANT
GLOVE ECLIPSE 7.5 STRL STRAW (GLOVE) IMPLANT
GLOVE ECLIPSE 8.0 STRL XLNG CF (GLOVE) IMPLANT
GLOVE ECLIPSE 8.5 STRL (GLOVE) IMPLANT
GLOVE EXAM NITRILE LRG STRL (GLOVE) IMPLANT
GLOVE EXAM NITRILE MD LF STRL (GLOVE) IMPLANT
GLOVE EXAM NITRILE XL STR (GLOVE) IMPLANT
GLOVE EXAM NITRILE XS STR PU (GLOVE) IMPLANT
GLOVE INDICATOR 6.5 STRL GRN (GLOVE) IMPLANT
GLOVE INDICATOR 7.0 STRL GRN (GLOVE) IMPLANT
GLOVE INDICATOR 7.5 STRL GRN (GLOVE) IMPLANT
GLOVE INDICATOR 8.0 STRL GRN (GLOVE) IMPLANT
GLOVE INDICATOR 8.5 STRL (GLOVE) IMPLANT
GLOVE OPTIFIT SS 8.0 STRL (GLOVE) IMPLANT
GLOVE SURG SS PI 6.5 STRL IVOR (GLOVE) IMPLANT
GOWN STRL REUS W/ TWL LRG LVL3 (GOWN DISPOSABLE) ×2 IMPLANT
GOWN STRL REUS W/ TWL XL LVL3 (GOWN DISPOSABLE) IMPLANT
GOWN STRL REUS W/TWL 2XL LVL3 (GOWN DISPOSABLE) IMPLANT
GOWN STRL REUS W/TWL LRG LVL3 (GOWN DISPOSABLE) ×4
GOWN STRL REUS W/TWL XL LVL3 (GOWN DISPOSABLE)
KIT BASIN OR (CUSTOM PROCEDURE TRAY) ×3 IMPLANT
KIT ROOM TURNOVER OR (KITS) ×3 IMPLANT
NS IRRIG 1000ML POUR BTL (IV SOLUTION) ×3 IMPLANT
PACK LAMINECTOMY NEURO (CUSTOM PROCEDURE TRAY) ×3 IMPLANT
PAD ARMBOARD 7.5X6 YLW CONV (MISCELLANEOUS) ×9 IMPLANT
SPONGE LAP 4X18 X RAY DECT (DISPOSABLE) IMPLANT
SPONGE SURGIFOAM ABS GEL SZ50 (HEMOSTASIS) ×3 IMPLANT
STRIP CLOSURE SKIN 1/2X4 (GAUZE/BANDAGES/DRESSINGS) IMPLANT
SUT VIC AB 0 CT1 18XCR BRD8 (SUTURE) ×1 IMPLANT
SUT VIC AB 0 CT1 8-18 (SUTURE) ×2
SUT VIC AB 2-0 CT1 18 (SUTURE) ×3 IMPLANT
SUT VIC AB 3-0 SH 8-18 (SUTURE) ×3 IMPLANT
SWAB COLLECTION DEVICE MRSA (MISCELLANEOUS) IMPLANT
SWAB CULTURE LIQ STUART DBL (MISCELLANEOUS) ×3 IMPLANT
SYR 20ML ECCENTRIC (SYRINGE) ×3 IMPLANT
TOWEL OR 17X24 6PK STRL BLUE (TOWEL DISPOSABLE) ×3 IMPLANT
TOWEL OR 17X26 10 PK STRL BLUE (TOWEL DISPOSABLE) ×3 IMPLANT
TUBE ANAEROBIC SPECIMEN COL (MISCELLANEOUS) ×3 IMPLANT
WATER STERILE IRR 1000ML POUR (IV SOLUTION) ×3 IMPLANT

## 2015-02-16 NOTE — Anesthesia Procedure Notes (Signed)
Procedure Name: Intubation Date/Time: 02/16/2015 2:44 PM Performed by: Alanda AmassFRIEDMAN, Artasia Thang A Pre-anesthesia Checklist: Patient identified, Timeout performed, Emergency Drugs available, Suction available and Patient being monitored Patient Re-evaluated:Patient Re-evaluated prior to inductionOxygen Delivery Method: Circle system utilized Preoxygenation: Pre-oxygenation with 100% oxygen Intubation Type: IV induction Ventilation: Mask ventilation without difficulty Laryngoscope Size: Mac and 3 Grade View: Grade I Tube type: Oral Tube size: 7.5 mm Number of attempts: 1 Airway Equipment and Method: Stylet Placement Confirmation: ETT inserted through vocal cords under direct vision,  breath sounds checked- equal and bilateral and positive ETCO2 Secured at: 22 cm Tube secured with: Tape Dental Injury: Teeth and Oropharynx as per pre-operative assessment

## 2015-02-16 NOTE — Anesthesia Preprocedure Evaluation (Addendum)
Anesthesia Evaluation  Patient identified by MRN, date of birth, ID band Patient awake    Reviewed: Allergy & Precautions, NPO status , Patient's Chart, lab work & pertinent test results  History of Anesthesia Complications (+) PONV and history of anesthetic complications  Airway Mallampati: I  TM Distance: >3 FB Neck ROM: full    Dental  (+) Teeth Intact, Dental Advisory Given   Pulmonary neg pulmonary ROS,  breath sounds clear to auscultation        Cardiovascular negative cardio ROS  Rhythm:regular Rate:Normal     Neuro/Psych  Headaches, PSYCHIATRIC DISORDERS Anxiety Depression Bipolar Disorder    GI/Hepatic (+) Hepatitis -, C  Endo/Other    Renal/GU      Musculoskeletal  (+) Arthritis -,   Abdominal   Peds  Hematology   Anesthesia Other Findings   Reproductive/Obstetrics                            Anesthesia Physical Anesthesia Plan  ASA: II  Anesthesia Plan: General   Post-op Pain Management:    Induction: Intravenous  Airway Management Planned: Oral ETT  Additional Equipment:   Intra-op Plan:   Post-operative Plan: Extubation in OR  Informed Consent: I have reviewed the patients History and Physical, chart, labs and discussed the procedure including the risks, benefits and alternatives for the proposed anesthesia with the patient or authorized representative who has indicated his/her understanding and acceptance.     Plan Discussed with: CRNA, Anesthesiologist and Surgeon  Anesthesia Plan Comments:         Anesthesia Quick Evaluation

## 2015-02-16 NOTE — Progress Notes (Signed)
Precedex gtt dc'd at 1630, pt tolerating well, pt easily arousable, oriented X4, denies pain, will cont to assess

## 2015-02-16 NOTE — H&P (Signed)
Corey Gilbert is an 49 y.o. male.   Chief Complaint: wound drainage HPI: whom underwent a lumbar fusion on May 4th, 2016 at L4/5, and L5/S1with instrumentation. He was doing well until yesterday when he noticed the wound was draining. The wound is very tender by report. I instructed patient to come in to hospital for evaluation.   Past Medical History  Diagnosis Date  . Mental disorder     Bi- Polar  . Complication of anesthesia   . PONV (postoperative nausea and vomiting)   . Near syncope     due to opain, last time  early Sept 2013  . Depression   . Hepatitis     Hepatitis C  . Concussion   . Kidney stones   . Arthritis   . Family history of anesthesia complication     pon& v  . Bipolar disorder     Past Surgical History  Procedure Laterality Date  . Wrist surgery  2013    left at [facility]  . Wrist surgery  2000    right  . Lumbar laminectomy/decompression microdiscectomy  06/14/2012    Procedure: LUMBAR LAMINECTOMY/DECOMPRESSION MICRODISCECTOMY 1 LEVEL;  Surgeon: Floyce Stakes, MD;  Location: [place] [facility];  Service: Neurosurgery;  Laterality: Left;  Left Lumbar Removal of Fragment/Laminectomy Lumbar Three-Four  . Back surgery  2013    at [facility]  . Sp arthro thumb*l*  2013  left  2000 right    bilateral tendon  release.   Marland Kitchen Anterior cervical decomp/discectomy fusion  09/27/2012    Procedure: ANTERIOR CERVICAL DECOMPRESSION/DISCECTOMY FUSION 2 LEVELS;  Surgeon: Floyce Stakes, MD;  Location: [facility];  Service: Neurosurgery;  Laterality: N/A;  Cervical four-five, Cervical five-six,  Anterior cervical decompression/diskectomy/fusion/plate    No family history on file. Social History:  reports that he has never smoked. He does not have any smokeless tobacco history on file. He reports that he does not drink alcohol or use illicit drugs.  Allergies:  Allergies  Allergen Reactions  . Toradol [Ketorolac Tromethamine] Other (See Comments)    Reacts with other medications; also,  fever and mania  . Other Other (See Comments)    Antidepressants   . Acetaminophen Other (See Comments)    Avoids due to history of Hepatitis C  . Baclofen Other (See Comments)    Goes manic  . Gabapentin Other (See Comments)    Goes manic  . Ibuprofen Other (See Comments)    Goes manic  . Lyrica [Pregabalin] Other (See Comments)    Mania     (Not in a hospital admission)  Results for orders placed or performed during the hospital encounter of 02/16/15 (from the past 48 hour(s))  CBC with Differential/Platelet     Status: Abnormal   Collection Time: 02/16/15  8:59 AM  Result Value Ref Range   WBC 12.3 (H) 4.0 - 10.5 K/uL   RBC 4.48 4.22 - 5.81 MIL/uL   Hemoglobin 13.3 13.0 - 17.0 g/dL   HCT 39.3 39.0 - 52.0 %   MCV 87.7 78.0 - 100.0 fL   MCH 29.7 26.0 - 34.0 pg   MCHC 33.8 30.0 - 36.0 g/dL   RDW 13.2 11.5 - 15.5 %   Platelets 138 (L) 150 - 400 K/uL   Neutrophils Relative % 75 43 - 77 %   Neutro Abs 9.2 (H) 1.7 - 7.7 K/uL   Lymphocytes Relative 12 12 - 46 %   Lymphs Abs 1.5 0.7 - 4.0 K/uL  Monocytes Relative 12 3 - 12 %   Monocytes Absolute 1.4 (H) 0.1 - 1.0 K/uL   Eosinophils Relative 1 0 - 5 %   Eosinophils Absolute 0.2 0.0 - 0.7 K/uL   Basophils Relative 0 0 - 1 %   Basophils Absolute 0.0 0.0 - 0.1 K/uL  Basic metabolic panel     Status: Abnormal   Collection Time: 02/16/15  8:59 AM  Result Value Ref Range   Sodium 137 135 - 145 mmol/L   Potassium 3.6 3.5 - 5.1 mmol/L   Chloride 99 (L) 101 - 111 mmol/L   CO2 28 22 - 32 mmol/L   Glucose, Bld 92 65 - 99 mg/dL   BUN 9 6 - 20 mg/dL   Creatinine, Ser 0.81 0.61 - 1.24 mg/dL   Calcium 9.4 8.9 - 10.3 mg/dL   GFR calc non Af Amer >60 >60 mL/min   GFR calc Af Amer >60 >60 mL/min    Comment: (NOTE) The eGFR has been calculated using the CKD EPI equation. This calculation has not been validated in all clinical situations. eGFR's persistently <60 mL/min signify possible Chronic Kidney Disease.    Anion gap 10 5 - 15   Protime-INR     Status: None   Collection Time: 02/16/15  8:59 AM  Result Value Ref Range   Prothrombin Time 14.1 11.6 - 15.2 seconds   INR 1.07 0.00 - 1.49   No results found.  Review of Systems  Constitutional: Negative.   HENT: Negative.   Eyes: Negative.   Respiratory: Negative.   Cardiovascular: Negative.   Gastrointestinal: Negative.   Genitourinary: Negative.   Musculoskeletal: Positive for back pain.  Skin: Negative.   Neurological: Negative.        Wound is indurated, draining, tender, warm  Endo/Heme/Allergies: Negative.   Psychiatric/Behavioral: Negative.     Blood pressure 130/83, pulse 76, temperature 98.4 F (36.9 C), temperature source Oral, resp. rate 18, height '5\' 10"'  (1.778 m), weight 83.008 kg (183 lb), SpO2 98 %. Physical Exam  Constitutional: He is oriented to person, place, and time. He appears well-developed and well-nourished.  HENT:  Head: Normocephalic and atraumatic.  Eyes: Conjunctivae and EOM are normal. Pupils are equal, round, and reactive to light.  Neck: Normal range of motion. Neck supple.  Cardiovascular: Normal rate, regular rhythm, normal heart sounds and intact distal pulses.   Respiratory: Effort normal and breath sounds normal.  GI: Soft.  Musculoskeletal: Normal range of motion. He exhibits tenderness.  Neurological: He is alert and oriented to person, place, and time. He has normal reflexes. He displays normal reflexes. No cranial nerve deficit. He exhibits normal muscle tone. Coordination normal.  Skin: There is erythema.  Wound is indurated, draining  Psychiatric: He has a normal mood and affect. His behavior is normal. Judgment and thought content normal.     Assessment/Plan Or for wound debridement  Jj Enyeart L 02/16/2015, 10:18 AM

## 2015-02-16 NOTE — ED Notes (Signed)
Pt c/o low back pain and that he has clear drainage coming from a surgical scar. Pt had back surgery May 4th. Pt ambulatory without difficulty.

## 2015-02-16 NOTE — ED Provider Notes (Signed)
CSN: [id number]     Arrival date & time 02/16/15  0810 History   First MD Initiated Contact with Patient 02/16/15 [id number]     Chief Complaint  Patient presents with  . Wound Check  . Back Pain  . Post-op Problem     (Consider location/radiation/quality/duration/timing/severity/associated sxs/prior Treatment) Patient is a 49 y.o. male presenting with wound check and back pain. The history is provided by the patient.  Wound Check This is a new problem. Episode onset: surgery on 01/23/15. Episode frequency: over the last 1-2 days. The problem has not changed since onset.Pertinent negatives include no chest pain, no abdominal pain, no headaches and no shortness of breath. Nothing aggravates the symptoms. Nothing relieves the symptoms. He has tried nothing for the symptoms. The treatment provided no relief.  Back Pain Associated symptoms: no abdominal pain, no chest pain, no dysuria, no fever, no headaches and no numbness     Past Medical History  Diagnosis Date  . Mental disorder     Bi- Polar  . Complication of anesthesia   . PONV (postoperative nausea and vomiting)   . Near syncope     due to opain, last time  early Sept 2013  . Depression   . Hepatitis     Hepatitis C  . Concussion   . Kidney stones   . Arthritis   . Family history of anesthesia complication     pon& v  . Bipolar disorder    Past Surgical History  Procedure Laterality Date  . [facility]    [facility]  . Wrist surgery  2000    right  . Lumbar laminectomy/decompression microdiscectomy  06/14/2012    Procedure: LUMBAR LAMINECTOMY/DECOMPRESSION MICRODISCECTOMY 1 LEVEL;  Surgeon: Karn CassisErnesto M Botero, MD;  Location: [facility];  Service: Neurosurgery;  Laterality: Left;  Left Lumbar Removal of Fragment/Laminectomy Lumbar Three-Four  . [facility]  . Sp arthro thumb*l*  2013  left  2000 right    bilateral tendon  release.   Marland Kitchen. Anterior cervical decomp/discectomy fusion  09/27/2012   Procedure: ANTERIOR CERVICAL DECOMPRESSION/DISCECTOMY FUSION 2 LEVELS;  Surgeon: Karn CassisErnesto M Botero, MD;  Location: [facility];  Service: Neurosurgery;  Laterality: N/A;  Cervical four-five, Cervical five-six,  Anterior cervical decompression/diskectomy/fusion/plate   No family history on file. History  Substance Use Topics  . Smoking status: Never Smoker   . Smokeless tobacco: Not on file  . Alcohol Use: No     Comment: rarely    Review of Systems  Constitutional: Negative for fever.  HENT: Negative for drooling and rhinorrhea.   Eyes: Negative for pain.  Respiratory: Negative for cough and shortness of breath.   Cardiovascular: Negative for chest pain and leg swelling.  Gastrointestinal: Negative for nausea, vomiting, abdominal pain and diarrhea.  Genitourinary: Negative for dysuria and hematuria.  Musculoskeletal: Positive for back pain. Negative for gait problem and neck pain.  Skin: Negative for color change.  Neurological: Negative for numbness and headaches.  Hematological: Negative for adenopathy.  Psychiatric/Behavioral: Negative for behavioral problems.  All other systems reviewed and are negative.     Allergies  Toradol; Other; Acetaminophen; Baclofen; Gabapentin; Ibuprofen; and Lyrica  Home Medications   Prior to Admission medications   Medication Sig Start Date End Date Taking? Authorizing Provider  aspirin-acetaminophen-caffeine (EXCEDRIN MIGRAINE) [phone] MG per tablet Take 1 tablet by mouth every 6 (six) hours as needed for migraine.     Historical Provider,  MD  Ledipasvir-Sofosbuvir 90-400 MG TABS Take 1 tablet by mouth daily.    Historical Provider, MD  Multiple Vitamin (MULTIVITAMIN WITH MINERALS) TABS Take 1 tablet by mouth daily.    Historical Provider, MD  Oxycodone HCl 10 MG TABS Take 10 mg by mouth 5 (five) times daily.    Historical Provider, MD   BP 162/86 mmHg  Pulse 90  Temp(Src) 98.4 F (36.9 C) (Oral)  Resp 16  Ht  (1.778 m)  Wt  183 lb (83.008 kg)  BMI 26.26 kg/m2  SpO2 100% Physical Exam  Constitutional: He is oriented to person, place, and time. He appears well-developed and well-nourished.  HENT:  Head: Normocephalic and atraumatic.  Right Ear: External ear normal.  Left Ear: External ear normal.  Nose: Nose normal.  Mouth/Throat: Oropharynx is clear and moist. No oropharyngeal exudate.  Eyes: Conjunctivae and EOM are normal. Pupils are equal, round, and reactive to light.  Neck: Normal range of motion. Neck supple.  Cardiovascular: Normal rate, regular rhythm, normal heart sounds and intact distal pulses.  Exam reveals no gallop and no friction rub.   No murmur heard. Pulmonary/Chest: Effort normal and breath sounds normal. No respiratory distress. He has no wheezes.  Abdominal: Soft. Bowel sounds are normal. He exhibits no distension. There is no tenderness. There is no rebound and no guarding.  Musculoskeletal: Normal range of motion. He exhibits no edema or tenderness.  Mild to moderate swelling at the postoperative site in the low back. Faint erythema noted in this area. There appears to be mild serous drainage from the central area of the surgical incision. There does not appear to be any significant dehiscence.  Normal strength and sensation in bilateral lower extremities.  Neurological: He is alert and oriented to person, place, and time.  Skin: Skin is warm and dry.  Psychiatric: He has a normal mood and affect. His behavior is normal.  Nursing note and vitals reviewed.   ED Course  Procedures (including critical care time) Labs Review Labs Reviewed - No data to display  Imaging Review No results found.   EKG Interpretation None      MDM   Final diagnoses:  Wound infection, initial encounter    8:36 AM 49 y.o. male w hx of hepatitis, s/p lumbar fusion on 01/23/15 who presents with drainage from his low back surgical wound. He states that he had done well postoperatively and just  developed swelling and what appears to be serous drainage from the wound over the last 48 hours. He has also developed some shooting pains down his legs over the last 1-2 days. He denies any weakness or numbness. He denies any fevers. He has normal strength and sensation in his lower extremities on my exam. The wound appears to be draining serous fluid. He states that he has been in touch with his neurosurgeon, Dr.Botero, who is to evaluate him in the ER.  [facility] has seen and will admit.     Purvis Sheffield, MD 02/16/15 1055

## 2015-02-16 NOTE — Op Note (Signed)
02/16/2015  4:21 PM  PATIENT:  Corey Gilbert  49 y.o. male who is approximately 3 weeks status post lumbar L4-S1 fusion whose wound started to drain 24 hours ago. The wound was indurated, tender, and erythematous.  PRE-OPERATIVE DIAGNOSIS:  lumbar wound infection  POST-OPERATIVE DIAGNOSIS:  lumbar wound infection  PROCEDURE:  Procedure(s): LUMBAR WOUND DEBRIDEMENT  SURGEON: Surgeon(s): Coletta MemosKyle Darreon Lutes, MD  ASSISTANTS:none  ANESTHESIA:   general  EBL:  Total I/O In: 1750 [I.V.:1750] Out: 75 [Blood:75]  BLOOD ADMINISTERED:none  CELL SAVER GIVEN:none  COUNT:per nursing  DRAINS: none   SPECIMEN:  Source of Specimen:  wound   DICTATION: Corey Gilbert was taken to the operating room, intubated, and placed under a general anesthetic without difficulty. male He was prepped and draped in a sterile manner. I opened the incision with a 10 blade and golden translucent fluid emanated from the wound. I did not see purulent fluid, nor purulent tissue in the wound. I irrigated copiously and searched for a possible csf leak since i did not find pus. I did not appreciate a leak from the dura.  Once the irrigation was complete, I loosely approximated the thoracolumbar fascia. I dressed the wound with a wound vac sponge and connected that to the system.   PLAN OF CARE: Admit to inpatient   PATIENT DISPOSITION:  PACU - hemodynamically stable.   Delay start of Pharmacological VTE agent (>24hrs) due to surgical blood loss or risk of bleeding:  yes

## 2015-02-16 NOTE — Progress Notes (Signed)
ANTIBIOTIC CONSULT NOTE - INITIAL  Pharmacy Consult for vancomycin Indication: wound infection  Allergies  Allergen Reactions  . Toradol [Ketorolac Tromethamine] Other (See Comments)    Reacts with other medications; also, fever and mania  . Other Other (See Comments)    Antidepressants   . Acetaminophen Other (See Comments)    Avoids due to history of Hepatitis C  . Baclofen Other (See Comments)    Goes manic  . Gabapentin Other (See Comments)    Goes manic  . Ibuprofen Other (See Comments)    Goes manic  . Lyrica [Pregabalin] Other (See Comments)    Mania    Patient Measurements: Height: 5\' 10"  (177.8 cm) Weight: 183 lb (83.008 kg) IBW/kg (Calculated) : 73 Vital Signs: Temp: 98.8 F (37.1 C) (05/28 1615) Temp Source: Oral (05/28 1201) BP: 124/77 mmHg (05/28 1645) Pulse Rate: 73 (05/28 1630) Intake/Output from this shift: Total I/O In: 1750 [I.V.:1750] Out: 75 [Blood:75]  Labs:  Recent Labs  02/16/15 0859  WBC 12.3*  HGB 13.3  PLT 138*  CREATININE 0.81   Estimated Creatinine Clearance: 115.2 mL/min (by C-G formula based on Cr of 0.81). No results for input(s): VANCOTROUGH, VANCOPEAK, VANCORANDOM, GENTTROUGH, GENTPEAK, GENTRANDOM, TOBRATROUGH, TOBRAPEAK, TOBRARND, AMIKACINPEAK, AMIKACINTROU, AMIKACIN in the last 72 hours.   Microbiology: No results found for this or any previous visit (from the past 720 hour(s)).  Medical History: Past Medical History  Diagnosis Date  . Mental disorder     Bi- Polar  . Complication of anesthesia   . PONV (postoperative nausea and vomiting)   . Near syncope     due to opain, last time  early Sept 2013  . Depression   . Hepatitis     Hepatitis C  . Concussion   . Kidney stones   . Arthritis   . Family history of anesthesia complication     pon& v  . Bipolar disorder     Medications:  Anti-infectives    Start     Dose/Rate Route Frequency Ordered Stop   02/16/15 1800  Ledipasvir-Sofosbuvir 90-400 MG TABS 1  tablet     1 tablet Oral Every evening 02/16/15 1204     02/16/15 1423  vancomycin (VANCOCIN) 1 GM/200ML IVPB    Comments:  Alanda AmassFriedman, Scot   : cabinet override      02/16/15 1423 02/16/15 1619     Assessment: 49 year old male who is approximately 3 weeks s/p lumbar L4-S1 fusion whose wound started draining 24 hrs ago to start vancomycin for wound infection.   WBC 12.3, afebrile. Vancomycin 1g was given at 15:19 PM.   Goal of Therapy:  Vancomycin trough level 15-20 mcg/ml  Plan:  Vancomycin extra 500mg  now for total loading dose of 1500mg , then 1g IV q8h.   Monitor culture results, clinical status, renal function, and need for levels.   Link SnufferJessica Regan Mcbryar, PharmD, BCPS Clinical Pharmacist [phone] 02/16/2015,5:14 PM

## 2015-02-16 NOTE — Transfer of Care (Signed)
Immediate Anesthesia Transfer of Care Note  Patient: Corey Gilbert  Procedure(s) Performed: Procedure(s): LUMBAR WOUND DEBRIDEMENT (N/A)  Patient Location: PACU  Anesthesia Type:General  Level of Consciousness: awake  Airway & Oxygen Therapy: Patient Spontanous Breathing and Patient connected to nasal cannula oxygen  Post-op Assessment: Report given to RN and Post -op Vital signs reviewed and stable  Post vital signs: Reviewed and stable  Last Vitals:  Filed Vitals:   02/16/15 1201  BP:   Pulse: 83  Temp:   Resp: 20    Complications: No apparent anesthesia complications

## 2015-02-16 NOTE — Anesthesia Postprocedure Evaluation (Signed)
Anesthesia Post Note  Patient: Corey Gilbert  Procedure(s) Performed: Procedure(s) (LRB): LUMBAR WOUND DEBRIDEMENT (N/A)  Anesthesia type: General  Patient location: PACU  Post pain: Pain level controlled and Adequate analgesia  Post assessment: Post-op Vital signs reviewed, Patient's Cardiovascular Status Stable, Respiratory Function Stable, Patent Airway and Pain level controlled  Last Vitals:  Filed Vitals:   02/16/15 1804  BP: 132/78  Pulse: 74  Temp: 36.7 C  Resp: 18    Post vital signs: Reviewed and stable  Level of consciousness: awake, alert  and oriented  Complications: No apparent anesthesia complications

## 2015-02-17 LAB — CBC WITH DIFFERENTIAL/PLATELET
BASOS ABS: 0 10*3/uL (ref 0.0–0.1)
Basophils Relative: 0 % (ref 0–1)
EOS ABS: 0.1 10*3/uL (ref 0.0–0.7)
EOS PCT: 1 % (ref 0–5)
HCT: 36.6 % — ABNORMAL LOW (ref 39.0–52.0)
HEMOGLOBIN: 12.5 g/dL — AB (ref 13.0–17.0)
LYMPHS ABS: 1.8 10*3/uL (ref 0.7–4.0)
LYMPHS PCT: 16 % (ref 12–46)
MCH: 29.9 pg (ref 26.0–34.0)
MCHC: 34.2 g/dL (ref 30.0–36.0)
MCV: 87.6 fL (ref 78.0–100.0)
Monocytes Absolute: 1.5 10*3/uL — ABNORMAL HIGH (ref 0.1–1.0)
Monocytes Relative: 13 % — ABNORMAL HIGH (ref 3–12)
NEUTROS ABS: 8.2 10*3/uL — AB (ref 1.7–7.7)
Neutrophils Relative %: 71 % (ref 43–77)
Platelets: 116 10*3/uL — ABNORMAL LOW (ref 150–400)
RBC: 4.18 MIL/uL — AB (ref 4.22–5.81)
RDW: 13.1 % (ref 11.5–15.5)
WBC: 11.6 10*3/uL — ABNORMAL HIGH (ref 4.0–10.5)

## 2015-02-17 MED ORDER — ACETAMINOPHEN 325 MG PO TABS
650.0000 mg | ORAL_TABLET | Freq: Four times a day (QID) | ORAL | Status: DC | PRN
  Administered 2015-02-17 – 2015-02-18 (×3): 650 mg via ORAL
  Filled 2015-02-17 (×3): qty 2

## 2015-02-17 NOTE — Progress Notes (Signed)
Patient ID: Corey Gilbert, male   DOB: August 02, 1966, 49 y.o.   MRN: [id number] Stabel. Drain working well. Cultures so far are negative

## 2015-02-18 LAB — BASIC METABOLIC PANEL
Anion gap: 7 (ref 5–15)
BUN: 6 mg/dL (ref 6–20)
CO2: 27 mmol/L (ref 22–32)
CREATININE: 0.76 mg/dL (ref 0.61–1.24)
Calcium: 8.6 mg/dL — ABNORMAL LOW (ref 8.9–10.3)
Chloride: 98 mmol/L — ABNORMAL LOW (ref 101–111)
GLUCOSE: 109 mg/dL — AB (ref 65–99)
POTASSIUM: 3.3 mmol/L — AB (ref 3.5–5.1)
SODIUM: 132 mmol/L — AB (ref 135–145)

## 2015-02-18 LAB — VANCOMYCIN, TROUGH: VANCOMYCIN TR: 11 ug/mL (ref 10.0–20.0)

## 2015-02-18 MED ORDER — DOCUSATE SODIUM 100 MG PO CAPS
100.0000 mg | ORAL_CAPSULE | Freq: Two times a day (BID) | ORAL | Status: DC
  Administered 2015-02-18 – 2015-02-21 (×7): 100 mg via ORAL
  Filled 2015-02-18 (×7): qty 1

## 2015-02-18 NOTE — Progress Notes (Signed)
PT OOB IN HALL WAY AMBULATING WITH MIN ASSIST ~250 FEET.. NO OBVIOUS DISTRESS. PAIN CONTROLLED. WOUND VAC IN PLACE. PT TOOK BATH IN BR, NO ACUTE DISTRESS.

## 2015-02-18 NOTE — Progress Notes (Signed)
Pt upset requested a new nurse stating that this nurse was late administering pain medication. Pt was administered pain medication from night nurse at 6:56am. During rounds this nurse asked pt if he was in pain and he stated that he wasn't in pain but stated that he has to have it on time. Shortly after pt requested pain medication and this nurse reported immediately. Pt was upset by then stated, " I need a new nurse, we just aint getting along. Im in the hospital and you were late bringing me my pain medicine." Pt was told that although he was able to have another type of pain medication at that time, effort was made to bring pain medication as soon as possible. He then stated, " Well you are still late." Administered pain medication and pt was given another nurse per request.

## 2015-02-18 NOTE — Progress Notes (Signed)
ANTIBIOTIC CONSULT NOTE - FOLLOW UP  Pharmacy Consult for Vancomycin Indication: wound infection  Allergies  Allergen Reactions  . Toradol [Ketorolac Tromethamine] Other (See Comments)    Reacts with other medications; also, fever and mania  . Other Other (See Comments)    Antidepressants   . Acetaminophen Other (See Comments)    Avoids due to history of Hepatitis C  . Baclofen Other (See Comments)    Goes manic  . Gabapentin Other (See Comments)    Goes manic  . Ibuprofen Other (See Comments)    Goes manic  . Lyrica [Pregabalin] Other (See Comments)    Mania    Patient Measurements: Height: 5\' 10"  (177.8 cm) Weight: 183 lb (83.008 kg) IBW/kg (Calculated) : 73  Vital Signs: Temp: 98.8 F (37.1 C) (05/30 1735) Temp Source: Oral (05/30 1735) BP: 142/82 mmHg (05/30 1735) Pulse Rate: 64 (05/30 1735) Intake/Output from previous day: 05/29 0701 - 05/30 0700 In: -  Out: 80 [Drains:80] Intake/Output from this shift: Total I/O In: 220 [P.O.:220] Out: -   Labs:  Recent Labs  02/16/15 0859 02/17/15 0530 02/18/15 1720  WBC 12.3* 11.6*  --   HGB 13.3 12.5*  --   PLT 138* 116*  --   CREATININE 0.81  --  0.76   Estimated Creatinine Clearance: 116.6 mL/min (by C-G formula based on Cr of 0.76).  Recent Labs  02/18/15 1720  VANCOTROUGH 11    Assessment: 49yom POD #2 lumbar wound debridement continues on day #3 vancomycin. Vancomycin trough is therapeutic. Renal function is stable.  Vancomycin 5/28>>  5/28 Wound >>few SA 5/28 Tissue >>few SA  Goal of Therapy:  Vancomycin trough level 10-15 mcg/ml  Plan:  1) Continue vancomycin 1g IV q8 2) Follow cultures, LOT  Fredrik RiggerMarkle, Thelma Lorenzetti Sue 02/18/2015,6:21 PM

## 2015-02-18 NOTE — Progress Notes (Signed)
Patient ID: Corey Gilbert, male   DOB: 10-Aug-1966, 49 y.o.   MRN: [id number] Stable.cultures negative so far. Drain working well. Needs to be oob

## 2015-02-19 ENCOUNTER — Encounter (HOSPITAL_COMMUNITY): Payer: Self-pay | Admitting: Neurosurgery

## 2015-02-19 DIAGNOSIS — Y848 Other medical procedures as the cause of abnormal reaction of the patient, or of later complication, without mention of misadventure at the time of the procedure: Secondary | ICD-10-CM

## 2015-02-19 DIAGNOSIS — B1920 Unspecified viral hepatitis C without hepatic coma: Secondary | ICD-10-CM

## 2015-02-19 DIAGNOSIS — Z981 Arthrodesis status: Secondary | ICD-10-CM

## 2015-02-19 DIAGNOSIS — B9561 Methicillin susceptible Staphylococcus aureus infection as the cause of diseases classified elsewhere: Secondary | ICD-10-CM

## 2015-02-19 DIAGNOSIS — T814XXA Infection following a procedure, initial encounter: Principal | ICD-10-CM

## 2015-02-19 LAB — WOUND CULTURE: GRAM STAIN: NONE SEEN

## 2015-02-19 LAB — TISSUE CULTURE

## 2015-02-19 MED ORDER — CEFAZOLIN SODIUM-DEXTROSE 2-3 GM-% IV SOLR
2.0000 g | Freq: Three times a day (TID) | INTRAVENOUS | Status: DC
  Administered 2015-02-19 – 2015-02-21 (×7): 2 g via INTRAVENOUS
  Filled 2015-02-19 (×9): qty 50

## 2015-02-19 MED ORDER — SODIUM CHLORIDE 0.9 % IJ SOLN
10.0000 mL | INTRAMUSCULAR | Status: DC | PRN
Start: 2015-02-19 — End: 2015-02-21
  Administered 2015-02-19: 10 mL
  Filled 2015-02-19: qty 40

## 2015-02-19 NOTE — Progress Notes (Signed)
Pt called for pain medication at 0600, pt was explained to that both of his pain medications were due in a couple of minutes as they were last given at 0430 for iv dilaudid and 0239 for tab oxycodone respectively which were given during my regular rounds without pt calling for them, and the next time they were due was written on the board as requested by pt.Pt got upset that a dose was missed while he was sleeping therefore he should get it right away. I tried to explain to pt that he can get another dose in about 30mins but he kept on yelling that I am the only RN that refused to comply with his medication regimen and requested for the Charge RN, Luan PullingRich was called who came and tried to explain the situation to him and eventually gave him the oxycodone at [phone]. Obasogie-Asidi, Adriannah Steinkamp Efe

## 2015-02-19 NOTE — Progress Notes (Addendum)
Patient stating that pain regimen not effective. MD paged  MD aware

## 2015-02-19 NOTE — Consult Note (Signed)
[facility] for Infectious Disease  Date of Admission:  02/16/2015  Date of Consult:  02/19/2015  Reason for Consult: Wound infection Referring Physician: Joya Salm  Impression/Recommendation Wound infection   MSSA Hep C  Insert PIC Change anbx to Ancef- plan for 6 weeks.  Continue Hep C therapy, f/u with his prev Hep C MD (Zamur). His repeat labs are pending. He is 6 weeks into therapy.  Check HIV  Thank you so much for this interesting consult,   Bobby Rumpf (pager) [phone] [email or web address]  Corey Gilbert is an 49 y.o. male.  HPI: 49 yo M with hx of bipolar and Hep C, he underwent L4/5 and L5/S1 lumbar fusion on 01-23-2015. He was wearing a back brace which he felt like was rubbing his wound. He returned to the hospital on 5-28 after he noticed wound d/c, fever, tenderness and erythema. He was afebrile with WBC 12.3 on adm.  He was taken to OR same day and his Cx have since grown MSSA.  His hospital course has been notable for fever to 101 on several occasions.  Past Medical History  Diagnosis Date  . Mental disorder     Bi- Polar  . Complication of anesthesia   . PONV (postoperative nausea and vomiting)   . Near syncope     due to opain, last time  early Sept 2013  . Depression   . Hepatitis     Hepatitis C  . Concussion   . Kidney stones   . Arthritis   . Family history of anesthesia complication     pon& v  . Bipolar disorder     Past Surgical History  Procedure Laterality Date  . Wrist surgery  2013    left at [facility]  . Wrist surgery  2000    right  . Lumbar laminectomy/decompression microdiscectomy  06/14/2012    Procedure: LUMBAR LAMINECTOMY/DECOMPRESSION MICRODISCECTOMY 1 LEVEL;  Surgeon: Floyce Stakes, MD;  Location: [facility];  Service: Neurosurgery;  Laterality: Left;  Left Lumbar Removal of Fragment/Laminectomy Lumbar Three-Four  . Back surgery  2013    at [facility]  . Sp arthro thumb*l*  2013  left  2000 right    bilateral tendon   release.   Marland Kitchen Anterior cervical decomp/discectomy fusion  09/27/2012    Procedure: ANTERIOR CERVICAL DECOMPRESSION/DISCECTOMY FUSION 2 LEVELS;  Surgeon: Floyce Stakes, MD;  Location: [facility];  Service: Neurosurgery;  Laterality: N/A;  Cervical four-five, Cervical five-six,  Anterior cervical decompression/diskectomy/fusion/plate  . Lumbar wound debridement N/A 02/16/2015    Procedure: LUMBAR WOUND DEBRIDEMENT;  Surgeon: Ashok Pall, MD;  Location: [place] [facility];  Service: Neurosurgery;  Laterality: N/A;     Allergies  Allergen Reactions  . Toradol [Ketorolac Tromethamine] Other (See Comments)    Reacts with other medications; also, fever and mania  . Other Other (See Comments)    Antidepressants   . Acetaminophen Other (See Comments)    Avoids due to history of Hepatitis C  . Baclofen Other (See Comments)    Goes manic  . Gabapentin Other (See Comments)    Goes manic  . Ibuprofen Other (See Comments)    Goes manic  . Lyrica [Pregabalin] Other (See Comments)    Mania    Medications:  Scheduled: . docusate sodium  100 mg Oral BID  . Ledipasvir-Sofosbuvir  1 tablet Oral QPM  . multivitamin with minerals  1 tablet Oral Daily  . vancomycin  1,000 mg Intravenous Q8H    Abtx:  Anti-infectives    Start     Dose/Rate Route Frequency Ordered Stop   02/17/15 0130  vancomycin (VANCOCIN) IVPB 1000 mg/200 mL premix     1,000 mg 200 mL/hr over 60 Minutes Intravenous Every 8 hours 02/16/15 1723     02/16/15 1800  Ledipasvir-Sofosbuvir 90-400 MG TABS 1 tablet     1 tablet Oral Every evening 02/16/15 1204     02/16/15 1730  vancomycin (VANCOCIN) 500 mg in sodium chloride 0.9 % 100 mL IVPB     500 mg 100 mL/hr over 60 Minutes Intravenous  Once 02/16/15 1723 02/16/15 1953   02/16/15 1423  vancomycin (VANCOCIN) 1 GM/200ML IVPB    Comments:  Marinda Elk   : cabinet override      02/16/15 1423 02/16/15 1619      Total days of antibiotics: 3 vanco          Social History:  reports  that he has never smoked. He does not have any smokeless tobacco history on file. He reports that he does not drink alcohol or use illicit drugs.  No family history on file.  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 see HPI.   Blood pressure 139/91, pulse 79, temperature 98.3 F (36.8 C), temperature source Oral, resp. rate 20, height '5\' 10"'  (1.778 m), weight 83.008 kg (183 lb), SpO2 100 %. General appearance: alert, cooperative and no distress Eyes: negative findings: conjunctivae and sclerae normal and pupils equal, round, reactive to light and accomodation Throat: normal findings: oropharynx pink & moist without lesions or evidence of thrush Neck: no adenopathy and supple, symmetrical, trachea midline Back: back wound is clean, VAC in place, erythema around VAC.  Lungs: clear to auscultation bilaterally Heart: regular rate and rhythm Abdomen: normal findings: bowel sounds normal and soft, non-tender Extremities: edema none   Results for orders placed or performed during the hospital encounter of 02/16/15 (from the past 48 hour(s))  Vancomycin, trough     Status: None   Collection Time: 02/18/15  5:20 PM  Result Value Ref Range   Vancomycin Tr 11 10.0 - 20.0 ug/mL  Basic metabolic panel     Status: Abnormal   Collection Time: 02/18/15  5:20 PM  Result Value Ref Range   Sodium 132 (L) 135 - 145 mmol/L   Potassium 3.3 (L) 3.5 - 5.1 mmol/L   Chloride 98 (L) 101 - 111 mmol/L   CO2 27 22 - 32 mmol/L   Glucose, Bld 109 (H) 65 - 99 mg/dL   BUN 6 6 - 20 mg/dL   Creatinine, Ser 0.76 0.61 - 1.24 mg/dL   Calcium 8.6 (L) 8.9 - 10.3 mg/dL   GFR calc non Af Amer >60 >60 mL/min   GFR calc Af Amer >60 >60 mL/min    Comment: (NOTE) The eGFR has been calculated using the CKD EPI equation. This calculation has not been validated in all clinical situations. eGFR's persistently <60 mL/min signify possible Chronic  Kidney Disease.    Anion gap 7 5 - 15      Component Value Date/Time   SDES TISSUE 02/16/2015 1530   SDES TISSUE 02/16/2015 1530   SPECREQUEST LUMBAR BACK 02/16/2015 1530   SPECREQUEST LUMBAR BACK 02/16/2015 1530   CULT  02/16/2015 1530    FEW STAPHYLOCOCCUS AUREUS Note: RIFAMPIN AND GENTAMICIN SHOULD NOT BE USED AS SINGLE DRUGS FOR TREATMENT OF STAPH INFECTIONS. This organism DOES NOT demonstrate  inducible Clindamycin resistance in vitro. Performed at [place] 02/19/2015 FINAL 02/16/2015 1530   REPTSTATUS 02/16/2015 FINAL 02/16/2015 1530   No results found. Recent Results (from the past 240 hour(s))  Anaerobic culture     Status: None (Preliminary result)   Collection Time: 02/16/15  3:11 PM  Result Value Ref Range Status   Specimen Description WOUND  Final   Special Requests LUMBAR BACK  Final   Gram Stain PENDING  Incomplete   Culture   Final    NO ANAEROBES ISOLATED; CULTURE IN PROGRESS FOR 5 DAYS Performed at [facility]    Report Status PENDING  Incomplete  Fungus Culture with Smear     Status: None (Preliminary result)   Collection Time: 02/16/15  3:11 PM  Result Value Ref Range Status   Specimen Description WOUND  Final   Special Requests LUMBAR BACK  Final   Fungal Smear   Final    NO YEAST OR FUNGAL ELEMENTS SEEN Performed at [facility]    Culture   Final    CULTURE IN PROGRESS FOR FOUR WEEKS Performed at [facility]    Report Status PENDING  Incomplete  Wound culture     Status: None   Collection Time: 02/16/15  3:11 PM  Result Value Ref Range Status   Specimen Description WOUND  Final   Special Requests LUMBAR BACK  Final   Gram Stain   Final    NO WBC SEEN NO SQUAMOUS EPITHELIAL CELLS SEEN NO ORGANISMS SEEN Performed at [facility]    Culture   Final    FEW STAPHYLOCOCCUS AUREUS Note: RIFAMPIN AND GENTAMICIN SHOULD NOT BE USED AS SINGLE DRUGS FOR TREATMENT OF STAPH INFECTIONS. This  organism DOES NOT demonstrate inducible Clindamycin resistance in vitro. Performed at [facility]    Report Status 02/19/2015 FINAL  Final   Organism ID, Bacteria STAPHYLOCOCCUS AUREUS  Final      Susceptibility   Staphylococcus aureus - MIC*    CLINDAMYCIN <=0.25 SENSITIVE Sensitive     ERYTHROMYCIN >=8 RESISTANT Resistant     GENTAMICIN <=0.5 SENSITIVE Sensitive     LEVOFLOXACIN 1 SENSITIVE Sensitive     OXACILLIN <=0.25 SENSITIVE Sensitive     PENICILLIN 0.06 SENSITIVE Sensitive     RIFAMPIN <=0.5 SENSITIVE Sensitive     TRIMETH/SULFA <=10 SENSITIVE Sensitive     VANCOMYCIN <=0.5 SENSITIVE Sensitive     TETRACYCLINE <=1 SENSITIVE Sensitive     MOXIFLOXACIN <=0.25 SENSITIVE Sensitive     * FEW STAPHYLOCOCCUS AUREUS  AFB culture with smear     Status: None (Preliminary result)   Collection Time: 02/16/15  3:11 PM  Result Value Ref Range Status   Specimen Description WOUND  Final   Special Requests LUMBAR BACK  Final   Acid Fast Smear   Final    NO WBC SEEN NO SQUAMOUS EPITHELIAL CELLS SEEN NO ORGANISMS SEEN Performed at [facility]    Culture PENDING  Incomplete   Report Status PENDING  Incomplete  Tissue culture     Status: None   Collection Time: 02/16/15  3:30 PM  Result Value Ref Range Status   Specimen Description TISSUE  Final   Special Requests LUMBAR BACK  Final   Gram Stain   Final    FEW WBC PRESENT, PREDOMINANTLY PMN NO ORGANISMS SEEN Performed at [facility] Performed at [facility]   Final  FEW STAPHYLOCOCCUS AUREUS Note: RIFAMPIN AND GENTAMICIN SHOULD NOT BE USED AS SINGLE DRUGS FOR TREATMENT OF STAPH INFECTIONS. This organism DOES NOT demonstrate inducible Clindamycin resistance in vitro. Performed at [facility]    Report Status 02/19/2015 FINAL  Final   Organism ID, Bacteria STAPHYLOCOCCUS AUREUS  Final      Susceptibility   Staphylococcus aureus - MIC*    CLINDAMYCIN <=0.25 SENSITIVE  Sensitive     ERYTHROMYCIN >=8 RESISTANT Resistant     GENTAMICIN <=0.5 SENSITIVE Sensitive     LEVOFLOXACIN 1 SENSITIVE Sensitive     OXACILLIN <=0.25 SENSITIVE Sensitive     PENICILLIN 0.06 SENSITIVE Sensitive     RIFAMPIN <=0.5 SENSITIVE Sensitive     TRIMETH/SULFA <=10 SENSITIVE Sensitive     VANCOMYCIN 1 SENSITIVE Sensitive     TETRACYCLINE <=1 SENSITIVE Sensitive     MOXIFLOXACIN <=0.25 SENSITIVE Sensitive     * FEW STAPHYLOCOCCUS AUREUS  Gram stain     Status: None   Collection Time: 02/16/15  3:30 PM  Result Value Ref Range Status   Specimen Description TISSUE  Final   Special Requests LUMBAR BACK  Final   Gram Stain   Final    FEW WBC PRESENT, PREDOMINANTLY PMN NO ORGANISMS SEEN    Report Status 02/16/2015 FINAL  Final      02/19/2015, 10:57 AM     LOS: 3 days

## 2015-02-19 NOTE — Progress Notes (Signed)
Peripherally Inserted Central Catheter/Midline Placement  The IV Nurse has discussed with the patient and/or persons authorized to consent for the patient, the purpose of this procedure and the potential benefits and risks involved with this procedure.  The benefits include less needle sticks, lab draws from the catheter and patient may be discharged home with the catheter.  Risks include, but not limited to, infection, bleeding, blood clot (thrombus formation), and puncture of an artery; nerve damage and irregular heat beat.  Alternatives to this procedure were also discussed.  PICC/Midline Placement Documentation  PICC / Midline Single Lumen 02/19/15 PICC Right Basilic 40 cm 0 cm (Active)  Exposed Catheter (cm) 0 cm 02/19/2015  5:13 PM  Dressing Change Due 02/26/15 02/19/2015  5:13 PM       Corey Gilbert, Corey Gilbert 02/19/2015, 5:15 PM

## 2015-02-19 NOTE — Progress Notes (Signed)
Dear Doctor: BoteroThis patient has been identified as a candidate for PICC for the following reason (s): IV therapy over 48 hours and drug pH or osmolality (causing phlebitis, infiltration in 24 hours) If you agree, please write an order for the indicated device. For any questions contact the Vascular Access Team at [phone] if no answer, please leave a message.  Thank you for supporting the early vascular access assessment program.

## 2015-02-19 NOTE — Progress Notes (Signed)
Patient ID: Corey Gilbert, male   DOB: 01-01-1966, 49 y.o.   MRN: [id number] Stable, no weakness. Some drainage. Few staph aereus in culture. Will get ID advise

## 2015-02-20 ENCOUNTER — Inpatient Hospital Stay (HOSPITAL_COMMUNITY): Payer: Medicaid Other

## 2015-02-20 LAB — HIV ANTIBODY (ROUTINE TESTING W REFLEX): HIV SCREEN 4TH GENERATION: NONREACTIVE

## 2015-02-20 MED ORDER — GADOBENATE DIMEGLUMINE 529 MG/ML IV SOLN
20.0000 mL | Freq: Once | INTRAVENOUS | Status: AC | PRN
Start: 2015-02-20 — End: 2015-02-20
  Administered 2015-02-20: 17 mL via INTRAVENOUS

## 2015-02-20 MED ORDER — HYDROMORPHONE HCL 1 MG/ML IJ SOLN
2.0000 mg | INTRAMUSCULAR | Status: DC | PRN
  Administered 2015-02-20 – 2015-02-21 (×14): 2 mg via INTRAVENOUS
  Filled 2015-02-20 (×14): qty 2

## 2015-02-20 MED ORDER — LORAZEPAM 1 MG PO TABS: 1.0000 mg | ORAL_TABLET | Freq: Four times a day (QID) | ORAL | Status: DC | PRN

## 2015-02-20 MED ORDER — DEXAMETHASONE SODIUM PHOSPHATE 10 MG/ML IJ SOLN
10.0000 mg | Freq: Once | INTRAMUSCULAR | Status: AC
  Administered 2015-02-20: 10 mg via INTRAVENOUS
  Filled 2015-02-20: qty 1

## 2015-02-20 MED ORDER — KETOROLAC TROMETHAMINE 30 MG/ML IJ SOLN: 30.0000 mg | Freq: Four times a day (QID) | INTRAMUSCULAR | Status: DC

## 2015-02-20 NOTE — Progress Notes (Signed)
New onset severe bilat LE pain And spasms,  Motor and sens exam appear intact but limited by pain.  Worry about epidural abscess.  Meds changed needs ASAP MRI

## 2015-02-20 NOTE — Progress Notes (Signed)
Patient complaining of severe pain and stiffness in bilateral lower extremities. Patient insisted that RN notify Md of pain. Patient expresses concern that staff infection has moved to lower extremities.  Pt given IV Dilaudid and Flexeril. On call Md notified. Md advised to give pt prn Valium and reassured pt that pain was not from spread of staff infection. Pt refused Valium.  Pt then reported that leg stiffness was so severe that he could not move his legs.  Pt crying out and shaking from pain.  Md paged again. Md came to floor to assess pt and placed orders for stat MRI of lumbar spine and made adjustments to pain medications. RN will continue to monitor.

## 2015-02-20 NOTE — Progress Notes (Signed)
Met with patient and wife, Corey Gilbert [phone] regarding discharge planning. Patient plans to discharge home with home health and IV antibiotic therapies.  He will also be discharging with a wound vac. CM explained the discharge planning process and offered agency choice. Patient has chosen [facility].  Miranda with [facility] was notified of anticipated home health orders.  Note was left in the chart for Dr Joya Salm requesting orders for [facility] RN, IV antibiotics and wound vac.  Paper wound vac script was placed on the shadow chart.  Wound dimensions (LxWxD) will be required to order vac for home use.  Bedside RN updated.  Await necessary orders to proceed with discharge planning.

## 2015-02-20 NOTE — Plan of Care (Signed)
Problem: Consults Goal: Diagnosis - Spinal Surgery Outcome: Completed/Met Date Met:  02/20/15 Lumbar Laminectomy (Complex)

## 2015-02-20 NOTE — Progress Notes (Signed)
Patient refused to give RN harvoni edication to take to pharmacy for safe administration while in hospital. Patient stated his wife will adminster it to him while he stays at hospital. Patient and wife educated about safe administration and hospital policies, still refused to give medication to RN to take to pharmacy. Pharmacy aware, to send clinical pharmacist to see medication.

## 2015-02-20 NOTE — Clinical Documentation Improvement (Signed)
Please clarify documentation in the medical record of "debridement."   Specify diagnosis related to below supporting information, if appropriate.   Please document the below (4) key elements in a progress note: ? Type of instrument used? ? Depth of debridement/type of tissue removed? (e.g., skin, subcutaneous tissue, fascia, muscle, bone, other) ? Did the debridement extend outside or beyond the wound margins? ? Excisional vs. nonexcisional? ? Irrigation of wound (e.g, Versajet)? ? Location of wound? ? Document the size of the debridement.  Possible Diagnosis Non excisional debridement of wound Excisional debridement of the wound, with 10 " blade Unable to Clinically Determine Other  Supporting Information Per 02/16/15 MD Op note = opened the incision with a 10 blade and golden translucent fluid emanated from the wound. I did not see purulent fluid, nor purulent tissue in the wound. I irrigated copiously and searched for a possible csf leak since i did not find pus. I did not appreciate a leak from the dura. Once the irrigation was complete, I loosely approximated the thoracolumbar fascia. I dressed the wound with a wound vac sponge and connected that to the system.    Thank You,  Shelda Palarlene H Brae Schaafsma ,RN Clinical Documentation Specialist:  [phone]  [facility]- Health Information Management

## 2015-02-21 LAB — ANAEROBIC CULTURE: GRAM STAIN: NONE SEEN

## 2015-02-21 NOTE — Discharge Summary (Signed)
Physician Discharge Summary  Patient ID: Corey Gilbert MRN: [id number] DOB/AGE: [birth year] 49 y.o.  Admit date: 02/16/2015 Discharge date: 02/21/2015  Admission Diagnoses:lumbar fusion. Wound infection  Discharge Diagnoses:  Active Problems:   Wound infection after surgery   Discharged Condition: stable  Hospital Course: surgery  Consults: ID  Significant Diagnostic Studies: mri  Treatments: ID of lumbar wound by dr Franky Machoabbell  Discharge Exam: Blood pressure 128/82, pulse 80, temperature 98.7 F (37.1 C), temperature source Oral, resp. rate 20, height 5\' 10"  (1.778 m), weight 83.008 kg (183 lb), SpO2 98 %. Vacuum pad working well. Minimal drainage  Disposition: 01-Home or Self Care     Medication List    ASK your doctor about these medications        aspirin-acetaminophen-caffeine 250-250-65 MG per tablet  Commonly known as:  EXCEDRIN MIGRAINE  Take 1 tablet by mouth every 6 (six) hours as needed for migraine.     cyclobenzaprine 10 MG tablet  Commonly known as:  FLEXERIL  Take 10 mg by mouth every 8 (eight) hours as needed. pain     Ledipasvir-Sofosbuvir 90-400 MG Tabs  Take 1 tablet by mouth every evening.     multivitamin with minerals Tabs tablet  Take 1 tablet by mouth daily.     Oxycodone HCl 10 MG Tabs  Take 10 mg by mouth 5 (five) times daily.     oxyCODONE 15 MG immediate release tablet  Commonly known as:  ROXICODONE  Take 15 mg by mouth every 4 (four) hours as needed. pain           Follow-up Information    Follow up with Dr Ninetta LightsHatcher On 04/03/2015.   Why:  11:15am   Contact information:   [facility] [address] [place], [place] [address]  [phone] (Office)    to see me in 3 to 5 weeks  Signed: Shizuko Wojdyla M 02/21/2015, 4:05 PM

## 2015-02-21 NOTE — Progress Notes (Signed)
Patient ID: Corey Gilbert, male   DOB: 11-May-1966, 49 y.o.   MRN: [id number] Stable. Vacuum pad in place. Getting iv vancomycin. Dc today?

## 2015-02-21 NOTE — Progress Notes (Addendum)
Received a call from Young Berryickie Toye with [employer] stating that [facility] at [facility].  No estimated time of delivery was able to be provided at this time.  Note was left on chart for Dr Jeral FruitBotero requesting the face-to-face for the home health RN order prior to discharge. Patient and bedside RN updated.

## 2015-02-21 NOTE — Progress Notes (Signed)
Patient ID: Corey Gilbert, male   DOB: [birth year], 49 y.o.   MRN: [id number]

## 2015-02-21 NOTE — Care Management Note (Signed)
Case Management Note  Patient Details  Name: Corey Gilbert MRN: [id number] Date of Birth: 1965/12/20  Subjective/Objective:                    Action/Plan:  Signed wound vac prescription and Medicaid forms received from Dr Jeral FruitBotero.  [facility].  IV antibiotic orders and script received from Dr Ninetta LightsHatcher.  Home health RN orders placed.  Faxed copy of script and home health orders were [facility].  All requested information and signed paperwork were faxed to Rickie Toye with [employer] to begin wound vac authorization for probable discharge home later today.  Per Dr Moshe CiproHatcher's request, CM attempted to reach Selena BattenKim in scheduling at Dr Moshe CiproHatcher's office to request a follow-up appointment for patient in 1 month.  Unable to reach Kim at number provided by the office [phone]. Will continue attempts to make follow-up appointment.  Bedside RN updated. Expected Discharge Date:                  Expected Discharge Plan:  Home w Home Health Services  In-House Referral:     Discharge planning Services  CM Consult  Post Acute Care Choice:  Durable Medical Equipment, Home Health Choice offered to:  Patient  DME Arranged:  Vac DME Agency:  [employer]  HH Arranged:  RN, IV Antibiotics HH Agency:  [facility]  Status of Service:  Completed, signed off  Medicare Important Message Given:  No Date Medicare IM Given:    Medicare IM give by:    Date Additional Medicare IM Given:    Additional Medicare Important Message give by:     If discussed at Long Length of Stay Meetings, dates discussed:    Additional Comments:  Anda KraftRobarge, Jaycee Mckellips C, RN 02/21/2015, 12:14 PM

## 2015-02-21 NOTE — Consult Note (Addendum)
WOC follow-up: Called to assist bedside nurse; Vac machine alarming leak.  Reinforced drape to back wound and changed cannister which had a large amt of foamy drainage near the filter.  Good seal obtained at 125mm. Please re-consult if further assistance is needed.  Thank-you,  Cammie Mcgeeawn Ruba Outen MSN, RN, CWOCN, [place], CNS [phone]

## 2015-02-21 NOTE — Consult Note (Addendum)
WOC wound consult note Reason for Consult: Consult requested by neurosurgery team to change back Vac dressing  Wound type: Full thickness post-op incision to middle back Measurement: 9X3X3cm with undermining to 1 cm at 12:00 o'clock Wound bed: Beefy red Drainage (amount, consistency, odor) No odor, mod amt yellow drainage in cannister. Periwound: Intact skin surrounding. Dressing procedure/placement/frequency: Removed 2 pieces black foam.  Applied one new piece into woundbed, covered skin with drape, then added another piece of black foam to bridge trackpad to left hip and reduce pressure to site, then sealed with drape.  Pt tolerated with minimal amt pain after meds had been given earlier.  Plan for bedside nurse to change Q Tues/Thurs.  Pt awaiting approval of home Vac prior to discharge. Please re-consult if further assistance is needed.  Thank-you,  Cammie Mcgeeawn Takao Lizer MSN, RN, CWOCN, [place]AP, CNS [phone]

## 2015-02-21 NOTE — Progress Notes (Signed)
D/C orders received, pt for D/C home today with [facility].  IV and telemetry D/C.  Rx and D/C instructions given with verbalized understanding.  Family at bedside to assist with D/C.  Staff brought pt downstairs via wheelchair.  

## 2015-02-23 ENCOUNTER — Encounter (HOSPITAL_COMMUNITY): Payer: Self-pay | Admitting: *Deleted

## 2015-02-23 ENCOUNTER — Emergency Department (HOSPITAL_COMMUNITY)
Admission: EM | Admit: 2015-02-23 | Discharge: 2015-02-23 | Disposition: A | Discharge status: Home / Self Care | Payer: Medicaid Other | Attending: Emergency Medicine | Admitting: Emergency Medicine

## 2015-02-23 DIAGNOSIS — Z7982 Long term (current) use of aspirin: Secondary | ICD-10-CM | POA: Diagnosis not present

## 2015-02-23 DIAGNOSIS — Y831 Surgical operation with implant of artificial internal device as the cause of abnormal reaction of the patient, or of later complication, without mention of misadventure at the time of the procedure: Secondary | ICD-10-CM | POA: Insufficient documentation

## 2015-02-23 DIAGNOSIS — Z8719 Personal history of other diseases of the digestive system: Secondary | ICD-10-CM | POA: Insufficient documentation

## 2015-02-23 DIAGNOSIS — T82598A Other mechanical complication of other cardiac and vascular devices and implants, initial encounter: Secondary | ICD-10-CM | POA: Insufficient documentation

## 2015-02-23 DIAGNOSIS — Z87442 Personal history of urinary calculi: Secondary | ICD-10-CM | POA: Insufficient documentation

## 2015-02-23 DIAGNOSIS — M1990 Unspecified osteoarthritis, unspecified site: Secondary | ICD-10-CM | POA: Diagnosis not present

## 2015-02-23 DIAGNOSIS — Z79899 Other long term (current) drug therapy: Secondary | ICD-10-CM | POA: Insufficient documentation

## 2015-02-23 DIAGNOSIS — Z8619 Personal history of other infectious and parasitic diseases: Secondary | ICD-10-CM | POA: Diagnosis not present

## 2015-02-23 DIAGNOSIS — Z8659 Personal history of other mental and behavioral disorders: Secondary | ICD-10-CM | POA: Diagnosis not present

## 2015-02-23 DIAGNOSIS — Z87828 Personal history of other (healed) physical injury and trauma: Secondary | ICD-10-CM | POA: Insufficient documentation

## 2015-02-23 DIAGNOSIS — T82898A Other specified complication of vascular prosthetic devices, implants and grafts, initial encounter: Secondary | ICD-10-CM

## 2015-02-23 MED ORDER — ALTEPLASE 2 MG IJ SOLR
2.0000 mg | Freq: Once | INTRAMUSCULAR | Status: AC
  Administered 2015-02-23: 2 mg
  Filled 2015-02-23: qty 2

## 2015-02-23 NOTE — ED Notes (Signed)
Patient does not wish to change due to "only being here for them to flush my site"

## 2015-02-23 NOTE — ED Provider Notes (Signed)
CSN: [id number]     Arrival date & time 02/23/15  1248 History   First MD Initiated Contact with Patient 02/23/15 1256     Chief Complaint  Patient presents with  . Vascular Access Problem     (Consider location/radiation/quality/duration/timing/severity/associated sxs/prior Treatment) HPI  Corey Gilbert is a 49 y.o. male presenting with complaint of malfunctioning PICC line. PICC was placed for 6 weeks of ancef recommended by ID due to wound infection following lumbar laminectomy. Pt denies fevers, chills, nausea, vomiting. This condition happened today and is chronic. Home health nurse unable to administer medication and pt has missed all doses for today. Pt denies and worsening pain. He describes soreness after procedure. No problems with wound vac. No abdominal pain.    Past Medical History  Diagnosis Date  . Mental disorder     Bi- Polar  . Complication of anesthesia   . PONV (postoperative nausea and vomiting)   . Near syncope     due to opain, last time  early Sept 2013  . Depression   . Hepatitis     Hepatitis C  . Concussion   . Kidney stones   . Arthritis   . Family history of anesthesia complication     pon& v  . Bipolar disorder    Past Surgical History  Procedure Laterality Date  . Wrist surgery  2013    left at [facility]  . Wrist surgery  2000    right  . Lumbar laminectomy/decompression microdiscectomy  06/14/2012    Procedure: LUMBAR LAMINECTOMY/DECOMPRESSION MICRODISCECTOMY 1 LEVEL;  Surgeon: Karn Cassis, MD;  Location: [facility];  Service: Neurosurgery;  Laterality: Left;  Left Lumbar Removal of Fragment/Laminectomy Lumbar Three-Four  . Back surgery  2013    at [facility]  . Sp arthro thumb*l*  2013  left  2000 right    bilateral tendon  release.   Marland Kitchen Anterior cervical decomp/discectomy fusion  09/27/2012    Procedure: ANTERIOR CERVICAL DECOMPRESSION/DISCECTOMY FUSION 2 LEVELS;  Surgeon: Karn Cassis, MD;  Location: [facility];  Service: Neurosurgery;   Laterality: N/A;  Cervical four-five, Cervical five-six,  Anterior cervical decompression/diskectomy/fusion/plate  . Lumbar wound debridement N/A 02/16/2015    Procedure: LUMBAR WOUND DEBRIDEMENT;  Surgeon: Coletta Memos, MD;  Location: [facility];  Service: Neurosurgery;  Laterality: N/A;   History reviewed. No pertinent family history. History  Substance Use Topics  . Smoking status: Never Smoker   . Smokeless tobacco: Not on file  . Alcohol Use: No     Comment: rarely    Review of Systems 10 Systems reviewed and are negative for acute change except as noted in the HPI.    Allergies  Toradol; Other; Acetaminophen; Baclofen; Gabapentin; Ibuprofen; and Lyrica  Home Medications   Prior to Admission medications   Medication Sig Start Date End Date Taking? Authorizing Provider  aspirin-acetaminophen-caffeine (EXCEDRIN MIGRAINE) [phone] MG per tablet Take 1 tablet by mouth every 6 (six) hours as needed for migraine.    Yes Historical Provider, MD  Ledipasvir-Sofosbuvir 90-400 MG TABS Take 1 tablet by mouth every evening.    Yes Historical Provider, MD  Multiple Vitamin (MULTIVITAMIN WITH MINERALS) TABS Take 1 tablet by mouth daily.   Yes Historical Provider, MD  oxyCODONE (ROXICODONE) 15 MG immediate release tablet Take 15 mg by mouth every 4 (four) hours as needed. pain 02/01/15  Yes Historical Provider, MD  Oxycodone HCl 10 MG TABS Take 10 mg by mouth 5 (five) times daily.  Yes Historical Provider, MD  cyclobenzaprine (FLEXERIL) 10 MG tablet Take 10 mg by mouth every 8 (eight) hours as needed. pain 01/28/15   Historical Provider, MD   BP 124/86 mmHg  Pulse 75  Temp(Src) 98.1 F (36.7 C) (Oral)  Resp 16  SpO2 100% Physical Exam  Constitutional: He appears well-developed and well-nourished. No distress.  HENT:  Head: Normocephalic and atraumatic.  Eyes: Conjunctivae and EOM are normal. Right eye exhibits no discharge. Left eye exhibits no discharge.  Cardiovascular: Normal  rate and regular rhythm.   Pulmonary/Chest: Effort normal and breath sounds normal. No respiratory distress. He has no wheezes.  Abdominal: Soft. Bowel sounds are normal. He exhibits no distension. There is no tenderness.  Musculoskeletal:  No evidence of infection surrounding wound vac. No tenderness. Appears to be functioning properly.  Neurological: He is alert. He exhibits normal muscle tone. Coordination normal.  Skin: Skin is warm and dry. He is not diaphoretic.  PICC right upper arm without evidence of infection. No TTP.  Nursing note and vitals reviewed.   ED Course  Procedures (including critical care time) Labs Review Labs Reviewed - No data to display  Imaging Review No results found.   EKG Interpretation None      MDM   Final diagnoses:  Occluded PICC line, initial encounter   Pt with malfunctioning PICC line. No complaints of worsening pain. No infectious symptoms. VSS. No evidence of superficial infection on exam. IV team consulted and tPA ordered.   Pt PICC flushed without problems. Pt left before discharge or before receiving his discharge papers. Had previously discussed follow up with Dr. Ninetta LightsHatcher with ID, continued abx treatment with home health nurse and return precautions. Pt had verbalized agreement with plan.       Oswaldo ConroyVictoria Kaleisha Bhargava, PA-C 02/24/15 [id number]  Doug SouSam Jacubowitz, MD 02/24/15 [phone]

## 2015-02-23 NOTE — Discharge Instructions (Signed)
Return to the emergency room with worsening of symptoms, new symptoms or with symptoms that are concerning, especially fevers, chills, nausea vomiting, pus, malfunctioning PICC   PICC Home Guide A peripherally inserted central catheter (PICC) is a long, thin, flexible tube that is inserted into a vein in the upper arm. It is a form of intravenous (IV) access. It is considered to be a "central" line because the tip of the PICC ends in a large vein in your chest. This large vein is called the superior vena cava (SVC). The PICC tip ends in the SVC because there is a lot of blood flow in the SVC. This allows medicines and IV fluids to be quickly distributed throughout the body. The PICC is inserted using a sterile technique by a specially trained nurse or physician. After the PICC is inserted, a chest X-ray exam is done to be sure it is in the correct place.  A PICC may be placed for different reasons, such as:  To give medicines and liquid nutrition that can only be given through a central line. Examples are:  Certain antibiotic treatments.  Chemotherapy.  Total parenteral nutrition (TPN).  To take frequent blood samples.  To give IV fluids and blood products.  If there is difficulty placing a peripheral intravenous (PIV) catheter. If taken care of properly, a PICC can remain in place for several months. A PICC can also allow a person to go home from the hospital early. Medicine and PICC care can be managed at home by a family member or home health care team. WHAT PROBLEMS CAN HAPPEN WHEN I HAVE A PICC? Problems with a PICC can occasionally occur. These may include the following:  A blood clot (thrombus) forming in or at the tip of the PICC. This can cause the PICC to become clogged. A clot-dissolving medicine called tissue plasminogen activator (tPA) can be given through the PICC to help break up the clot.  Inflammation of the vein (phlebitis) in which the PICC is placed. Signs of  inflammation may include redness, pain at the insertion site, red streaks, or being able to feel a "cord" in the vein where the PICC is located.  Infection in the PICC or at the insertion site. Signs of infection may include fever, chills, redness, swelling, or pus drainage from the PICC insertion site.  PICC movement (malposition). The PICC tip may move from its original position due to excessive physical activity, forceful coughing, sneezing, or vomiting.  A break or cut in the PICC. It is important to not use scissors near the PICC.  Nerve or tendon irritation or injury during PICC insertion. WHAT SHOULD I KEEP IN MIND ABOUT ACTIVITIES WHEN I HAVE A PICC?  You may bend your arm and move it freely. If your PICC is near or at the bend of your elbow, avoid activity with repeated motion at the elbow.  Rest at home for the remainder of the day following PICC line insertion.  Avoid lifting heavy objects as instructed by your health care provider.  Avoid using a crutch with the arm on the same side as your PICC. You may need to use a walker. WHAT SHOULD I KNOW ABOUT MY PICC DRESSING?  Keep your PICC bandage (dressing) clean and dry to prevent infection.  Ask your health care provider when you may shower. Ask your health care provider to teach you how to wrap the PICC when you do take a shower.  Change the PICC dressing as instructed by your  health care provider.  Change your PICC dressing if it becomes loose or wet. WHAT SHOULD I KNOW ABOUT PICC CARE?  Check the PICC insertion site daily for leakage, redness, swelling, or pain.  Do not take a bath, swim, or use hot tubs when you have a PICC. Cover PICC line with clear plastic wrap and tape to keep it dry while showering.  Flush the PICC as directed by your health care provider. Let your health care provider know right away if the PICC is difficult to flush or does not flush. Do not use force to flush the PICC.  Do not use a syringe  that is less than 10 mL to flush the PICC.  Never pull or tug on the PICC.  Avoid blood pressure checks on the arm with the PICC.  Keep your PICC identification card with you at all times.  Do not take the PICC out yourself. Only a trained clinical professional should remove the PICC. SEEK IMMEDIATE MEDICAL CARE IF:  Your PICC is accidentally pulled all the way out. If this happens, cover the insertion site with a bandage or gauze dressing. Do not throw the PICC away. Your health care provider will need to inspect it.  Your PICC was tugged or pulled and has partially come out. Do not  push the PICC back in.  There is any type of drainage, redness, or swelling where the PICC enters the skin.  You cannot flush the PICC, it is difficult to flush, or the PICC leaks around the insertion site when it is flushed.  You hear a "flushing" sound when the PICC is flushed.  You have pain, discomfort, or numbness in your arm, shoulder, or jaw on the same side as the PICC.  You feel your heart "racing" or skipping beats.  You notice a hole or tear in the PICC.  You develop chills or a fever. MAKE SURE YOU:   Understand these instructions.  Will watch your condition.  Will get help right away if you are not doing well or get worse. Document Released: 03/14/2003 Document Revised: 01/22/2014 Document Reviewed: 05/15/2013 [facility] Patient Information 2015 [place], [place]. This information is not intended to replace advice given to you by your health care provider. Make sure you discuss any questions you have with your health care provider.

## 2015-02-23 NOTE — ED Notes (Signed)
Pt walked out of ED and did not wait until his d/c instructions were printed. Pt observed walking out of the ED with steady gait. Benetta SparVictoria, C- PA informed.

## 2015-02-23 NOTE — ED Notes (Signed)
IV Team at bedside 

## 2015-02-23 NOTE — ED Notes (Signed)
Corey Gilbert, [place] informed that pt has been cleared by IV  Team and is ready to go.

## 2015-02-23 NOTE — ED Notes (Signed)
Pt here due to picc line being clotted. No other complaints.

## 2015-03-04 ENCOUNTER — Telehealth: Payer: Self-pay | Admitting: *Deleted

## 2015-03-04 DIAGNOSIS — R112 Nausea with vomiting, unspecified: Secondary | ICD-10-CM

## 2015-03-04 NOTE — Telephone Encounter (Signed)
Patient called the office to ask if night sweats were normal on IV medications and he is taking Harvoni. Asked him if he was having any fever, chills, rashes muscle aches or joint pain and he denied all. Asked about the sweats and he advised they were worse in the beginning and now getting better hr does not sweat every night and does not have to change the sheets. Advised him to watch for fever, rash and muscle aches or chest painAdvised the will let the doctor know and call him back if anything is out of the ordinary. Advised him sounds normal and not to worry.

## 2015-03-12 ENCOUNTER — Telehealth: Payer: Self-pay | Admitting: *Deleted

## 2015-03-12 MED ORDER — ONDANSETRON HCL 4 MG PO TABS: 4.0000 mg | ORAL_TABLET | Freq: Three times a day (TID) | ORAL | Status: DC | PRN

## 2015-03-12 NOTE — Telephone Encounter (Addendum)
Left message for the pt.   Per Dr. Luciana Axe Changing IV antibiotic to ceftriaxone starting tomorrow.  [facility] RN will monitor temperature and pt's night sweats after beginning new medication tomorrow during home visits to change VAC dressing M/W/F.  If pt has questions he is to call his [facility] RN or the [facility] pharmacy. New order for IV antibiotic change phoned to [facility] pharmacy.  Spoke with Corrie Dandy, pharmacist and gave her the new order, ceftriaxone 2 grams once daily.  Verbalized back by [facility], pharmacist.

## 2015-03-12 NOTE — Telephone Encounter (Signed)
Could change it to ceftriaxone 2 grams once a day and see if that helps

## 2015-03-12 NOTE — Telephone Encounter (Signed)
Patient called upset and requesting a call from ID doctor. He has a lot of questions regarding IV antibiotics and he said his home health RN told him to ask the MD these questions. He is having extreme night sweats and is unable to sleep. He has been told by home health that all of his labs are fine. I tried to explain that his culture in the hospital grew MSSA; but he wants a call from MD [phone]. Hospital follow up with Dr. Ninetta Lights is 04/03/15

## 2015-03-12 NOTE — Telephone Encounter (Addendum)
Dr. Luciana Axe provided phone order for oral Zofran 4 MG three (3) times a day PRN nausea to be called to the pt's pharmacy. [facility], [facility], reports that the pt is having night sweats that "soak" the bed.  Temperature taken by [facility] RN when she visits in the AM are 97.8, and 97.9 for the past two visits.  RN requested that the [facility] RN find out additional information about the pt's temperature in the evening, if he is taking it then.  Also, requested to find out if the pt is taking any antipyretic in the evening prior to bedtime and if the pt is consuming adequate hydration due to the night sweats.  Corrie Dandy, [facility], will notify the [facility] RN that the oral Zofran has been sent to the pt's pharmacy, to obtain this additional information and report to Dr. Luciana Axe via pager.

## 2015-03-12 NOTE — Addendum Note (Signed)
Addended by: Jennet Maduro D on: 03/12/2015 10:16 AM   Modules accepted: Orders, Medications

## 2015-03-13 NOTE — Telephone Encounter (Signed)
Patient stated he was very unhappy with this response and wanted me to speak with Dr. Ninetta Lights next week and be given a work in appointment. Patient said he feels he may be dead by next week due to excessive sleep caused by the antibiotics. Called his home health nurse, Kathie Rhodes with [facility] and she said the only issue she knew about was the nausea. He was given Zofran for this issue. She sees the patient on Friday, 03/15/15 and said she would speak to him and try and see what can be done. He is upset with the fact that he is unable to reach the provider of the antibiotics. He has been reading up on them online and says he is aware that these drugs can cause death and he has a lot of concerns.

## 2015-03-13 NOTE — Telephone Encounter (Signed)
I don't know anything about the pt, maybe get him in to be seen by clinic doc or Hatcher next week.

## 2015-03-14 ENCOUNTER — Telehealth: Payer: Self-pay | Admitting: Internal Medicine

## 2015-03-14 NOTE — Telephone Encounter (Signed)
Spoke with wife evening of 6/22.  He was changed to ceftriaxone and did not tolerate so will go back to cefazolin for now again and if not tolerated will need to change to vancomycin with trough goal of 15-20.  Let [facility] know.  thanks

## 2015-03-14 NOTE — Telephone Encounter (Addendum)
Shared Dr. Ephriam Knuckles message w/ [facility] Pharmacist.  Pharmacist, Amy, to contact Dr. Luciana Axe to confirm/discuss orders.

## 2015-03-15 ENCOUNTER — Telehealth: Payer: Self-pay | Admitting: Licensed Clinical Social Worker

## 2015-03-15 LAB — FUNGUS CULTURE W SMEAR: FUNGAL SMEAR: NONE SEEN

## 2015-03-15 NOTE — Telephone Encounter (Signed)
Patient's wife called stating that the patient has episodes where he has sweats and doesn't feel well. Today he had sever sweats and went to sleep. Wife feels it is the IV antibiotics and wonders if this is normal. Please advise

## 2015-03-15 NOTE — Telephone Encounter (Signed)
I talked to her Wed when we went back to cefazolin.  This likely is the infection but can change to vancomycin if they prefer.   He should get an appt with Dr. Ninetta Lights next week.  thanks

## 2015-03-26 ENCOUNTER — Encounter (HOSPITAL_COMMUNITY): Payer: Self-pay | Admitting: *Deleted

## 2015-03-26 ENCOUNTER — Telehealth: Payer: Self-pay

## 2015-03-26 ENCOUNTER — Emergency Department (HOSPITAL_COMMUNITY): Payer: Medicaid Other

## 2015-03-26 ENCOUNTER — Observation Stay (HOSPITAL_COMMUNITY)
Admission: EM | Admit: 2015-03-26 | Discharge: 2015-03-26 | Disposition: A | Discharge status: Home / Self Care | Payer: Medicaid Other | Attending: Internal Medicine | Admitting: Internal Medicine

## 2015-03-26 DIAGNOSIS — Z87828 Personal history of other (healed) physical injury and trauma: Secondary | ICD-10-CM | POA: Diagnosis not present

## 2015-03-26 DIAGNOSIS — F99 Mental disorder, not otherwise specified: Secondary | ICD-10-CM | POA: Insufficient documentation

## 2015-03-26 DIAGNOSIS — N200 Calculus of kidney: Secondary | ICD-10-CM | POA: Insufficient documentation

## 2015-03-26 DIAGNOSIS — K759 Inflammatory liver disease, unspecified: Secondary | ICD-10-CM | POA: Diagnosis not present

## 2015-03-26 DIAGNOSIS — R7989 Other specified abnormal findings of blood chemistry: Secondary | ICD-10-CM

## 2015-03-26 DIAGNOSIS — R61 Generalized hyperhidrosis: Secondary | ICD-10-CM | POA: Diagnosis not present

## 2015-03-26 DIAGNOSIS — R079 Chest pain, unspecified: Principal | ICD-10-CM | POA: Insufficient documentation

## 2015-03-26 DIAGNOSIS — I4510 Unspecified right bundle-branch block: Secondary | ICD-10-CM | POA: Diagnosis present

## 2015-03-26 DIAGNOSIS — F329 Major depressive disorder, single episode, unspecified: Secondary | ICD-10-CM | POA: Diagnosis not present

## 2015-03-26 DIAGNOSIS — R112 Nausea with vomiting, unspecified: Secondary | ICD-10-CM | POA: Diagnosis present

## 2015-03-26 DIAGNOSIS — R1110 Vomiting, unspecified: Secondary | ICD-10-CM | POA: Diagnosis not present

## 2015-03-26 DIAGNOSIS — M1990 Unspecified osteoarthritis, unspecified site: Secondary | ICD-10-CM | POA: Insufficient documentation

## 2015-03-26 DIAGNOSIS — A4901 Methicillin susceptible Staphylococcus aureus infection, unspecified site: Secondary | ICD-10-CM | POA: Diagnosis not present

## 2015-03-26 DIAGNOSIS — T8149XA Infection following a procedure, other surgical site, initial encounter: Secondary | ICD-10-CM | POA: Diagnosis present

## 2015-03-26 DIAGNOSIS — B182 Chronic viral hepatitis C: Secondary | ICD-10-CM | POA: Diagnosis present

## 2015-03-26 DIAGNOSIS — F319 Bipolar disorder, unspecified: Secondary | ICD-10-CM | POA: Diagnosis present

## 2015-03-26 DIAGNOSIS — Z79899 Other long term (current) drug therapy: Secondary | ICD-10-CM | POA: Diagnosis not present

## 2015-03-26 DIAGNOSIS — R0602 Shortness of breath: Secondary | ICD-10-CM | POA: Diagnosis not present

## 2015-03-26 DIAGNOSIS — E871 Hypo-osmolality and hyponatremia: Secondary | ICD-10-CM | POA: Diagnosis present

## 2015-03-26 DIAGNOSIS — R778 Other specified abnormalities of plasma proteins: Secondary | ICD-10-CM | POA: Diagnosis present

## 2015-03-26 DIAGNOSIS — F419 Anxiety disorder, unspecified: Secondary | ICD-10-CM | POA: Diagnosis present

## 2015-03-26 DIAGNOSIS — D696 Thrombocytopenia, unspecified: Secondary | ICD-10-CM | POA: Diagnosis present

## 2015-03-26 LAB — COMPREHENSIVE METABOLIC PANEL
ALBUMIN: 3.2 g/dL — AB (ref 3.5–5.0)
ALT: 9 U/L — AB (ref 17–63)
AST: 33 U/L (ref 15–41)
Alkaline Phosphatase: 66 U/L (ref 38–126)
Anion gap: 9 (ref 5–15)
BUN: 14 mg/dL (ref 6–20)
CHLORIDE: 95 mmol/L — AB (ref 101–111)
CO2: 27 mmol/L (ref 22–32)
Calcium: 8.8 mg/dL — ABNORMAL LOW (ref 8.9–10.3)
Creatinine, Ser: 0.78 mg/dL (ref 0.61–1.24)
GFR calc Af Amer: >60 mL/min (ref >60)
Glucose, Bld: 129 mg/dL — ABNORMAL HIGH (ref 65–99)
Potassium: 3.5 mmol/L (ref 3.5–5.1)
Sodium: 131 mmol/L — ABNORMAL LOW (ref 135–145)
TOTAL PROTEIN: 7.4 g/dL (ref 6.5–8.1)
Total Bilirubin: 0.3 mg/dL (ref 0.3–1.2)

## 2015-03-26 LAB — MAGNESIUM: Magnesium: 2 mg/dL (ref 1.7–2.4)

## 2015-03-26 LAB — CBC
HCT: 34.5 % — ABNORMAL LOW (ref 39.0–52.0)
HEMOGLOBIN: 12 g/dL — AB (ref 13.0–17.0)
MCH: 28.7 pg (ref 26.0–34.0)
MCHC: 34.8 g/dL (ref 30.0–36.0)
MCV: 82.5 fL (ref 78.0–100.0)
Platelets: 78 10*3/uL — ABNORMAL LOW (ref 150–400)
RBC: 4.18 MIL/uL — AB (ref 4.22–5.81)
RDW: 13.6 % (ref 11.5–15.5)
WBC: 9.1 10*3/uL (ref 4.0–10.5)

## 2015-03-26 LAB — TROPONIN I
TROPONIN I: 0.47 ng/mL — AB (ref <0.031)
Troponin I: 0.65 ng/mL (ref <0.031)
Troponin I: 0.49 ng/mL — ABNORMAL HIGH (ref <0.031)

## 2015-03-26 LAB — I-STAT CG4 LACTIC ACID, ED: LACTIC ACID, VENOUS: 1.84 mmol/L (ref 0.5–2.0)

## 2015-03-26 LAB — SEDIMENTATION RATE: SED RATE: 45 mm/h — AB (ref 0–16)

## 2015-03-26 LAB — PROTIME-INR
INR: 1.17 (ref 0.00–1.49)
PROTHROMBIN TIME: 15.1 s (ref 11.6–15.2)

## 2015-03-26 MED ORDER — CYCLOBENZAPRINE HCL 10 MG PO TABS: 10.0000 mg | ORAL_TABLET | Freq: Three times a day (TID) | ORAL | Status: DC | PRN

## 2015-03-26 MED ORDER — OXYCODONE HCL 5 MG PO TABS
15.0000 mg | ORAL_TABLET | ORAL | Status: DC | PRN
Start: 2015-03-26 — End: 2015-03-27

## 2015-03-26 MED ORDER — HYDROMORPHONE HCL 1 MG/ML IJ SOLN
1.0000 mg | Freq: Once | INTRAMUSCULAR | Status: AC
  Administered 2015-03-26: 1 mg via INTRAVENOUS
  Filled 2015-03-26: qty 1

## 2015-03-26 MED ORDER — LORAZEPAM 2 MG/ML IJ SOLN
0.5000 mg | Freq: Once | INTRAMUSCULAR | Status: AC
  Administered 2015-03-26: 0.5 mg via INTRAVENOUS
  Filled 2015-03-26: qty 1

## 2015-03-26 MED ORDER — GI COCKTAIL ~~LOC~~
30.0000 mL | Freq: Once | ORAL | Status: AC
  Administered 2015-03-26: 30 mL via ORAL
  Filled 2015-03-26: qty 30

## 2015-03-26 MED ORDER — LORAZEPAM 2 MG/ML IJ SOLN: 0.5000 mg | Freq: Four times a day (QID) | INTRAMUSCULAR | Status: DC | PRN

## 2015-03-26 MED ORDER — ONDANSETRON HCL 4 MG/2ML IJ SOLN: 4.0000 mg | Freq: Four times a day (QID) | INTRAMUSCULAR | Status: DC | PRN

## 2015-03-26 MED ORDER — GI COCKTAIL ~~LOC~~: 30.0000 mL | Freq: Four times a day (QID) | ORAL | Status: DC | PRN

## 2015-03-26 MED ORDER — ZOLPIDEM TARTRATE 5 MG PO TABS: 5.0000 mg | ORAL_TABLET | Freq: Every evening | ORAL | Status: DC | PRN

## 2015-03-26 MED ORDER — CEFAZOLIN SODIUM-DEXTROSE 2-3 GM-% IV SOLR: 2.0000 g | Freq: Three times a day (TID) | INTRAVENOUS | Status: DC

## 2015-03-26 MED ORDER — ACETAMINOPHEN 325 MG PO TABS: 650.0000 mg | ORAL_TABLET | ORAL | Status: DC | PRN

## 2015-03-26 MED ORDER — ALTEPLASE 2 MG IJ SOLR
2.0000 mg | Freq: Once | INTRAMUSCULAR | Status: AC
  Administered 2015-03-26: 2 mg
  Filled 2015-03-26: qty 2

## 2015-03-26 MED ORDER — IOHEXOL 350 MG/ML SOLN
100.0000 mL | Freq: Once | INTRAVENOUS | Status: AC | PRN
  Administered 2015-03-26: 100 mL via INTRAVENOUS

## 2015-03-26 NOTE — Telephone Encounter (Signed)
Patients wife called this morning stating she was [facility] the way [facility] with Corey Gilbert.  He continues to have problems related to IV antibiotics and no one seems to be doing anything.   When asked about what symptoms he was having she responded "many" but she was mostly concerned about consistent and worsening  labored breathing.   She requested I call the ED to inform them she was on the way and they would need to see Dr Ninetta LightsHatcher.  Patient was advised ED physician will evaluate patient and call ID physician on call if needed.     Corey Josephsammy K Benny Henrie, RN

## 2015-03-26 NOTE — ED Notes (Signed)
IV team at bedside 

## 2015-03-26 NOTE — Progress Notes (Signed)
Was called into pt room and pt stated that if we weren't going to give him a dose of Dilaudid that he would be leaving. Pt stated that he received several doses of IV Dilaudid down in the ED. There were currently no active orders for IV Dilaudid. Instead of Dilaudid I offered him oxycodone and ativan. Pt refused this. Lillia Dallasalled Kirby NP, and discussed situation with her. She was ok with patient wanting to leave. I spoke with the pt and told him what was discussed and that there would not be any Dilaudid ordered. Pt asked for th e paperwork to leave. Telemetry monitoring and IV access removed. Escorted pt down to the ED where wife was picking him up.  Tera Helperooper, Braydee Shimkus E

## 2015-03-26 NOTE — Progress Notes (Signed)
ANTIBIOTIC CONSULT NOTE - INITIAL  Pharmacy Consult for cefazolin Indication: wound infection  Allergies  Allergen Reactions  . Toradol [Ketorolac Tromethamine] Other (See Comments)    Reacts with other medications; also, fever and mania  . Other Other (See Comments)    Antidepressants   . Acetaminophen Other (See Comments)    Avoids due to history of Hepatitis C  . Baclofen Other (See Comments)    Goes manic  . Gabapentin Other (See Comments)    Goes manic  . Ibuprofen Other (See Comments)    Goes manic  . Lyrica [Pregabalin] Other (See Comments)    Mania    Patient Measurements:   Adjusted Body Weight:   Vital Signs: Temp: 97.9 F (36.6 C) (07/05 0944) Temp Source: Oral (07/05 0944) BP: 143/114 mmHg (07/05 1045) Pulse Rate: 100 (07/05 1045) Intake/Output from previous day:   Intake/Output from this shift:    Labs:  Recent Labs  03/26/15 1050  WBC 9.1  HGB 12.0*  PLT 78*  CREATININE 0.78   CrCl cannot be calculated (Unknown ideal weight.). No results for input(s): VANCOTROUGH, VANCOPEAK, VANCORANDOM, GENTTROUGH, GENTPEAK, GENTRANDOM, TOBRATROUGH, TOBRAPEAK, TOBRARND, AMIKACINPEAK, AMIKACINTROU, AMIKACIN in the last 72 hours.   Microbiology: No results found for this or any previous visit (from the past 720 hour(s)).  Medical History: Past Medical History  Diagnosis Date  . Mental disorder     Bi- Polar  . Complication of anesthesia   . PONV (postoperative nausea and vomiting)   . Near syncope     due to opain, last time  early Sept 2013  . Depression   . Hepatitis     Hepatitis C  . Concussion   . Kidney stones   . Arthritis   . Family history of anesthesia complication     pon& v  . Bipolar disorder     Medications:  See EMR  Assessment: 49 yo male s/p lumbar fusion this May and subsequent MSSA wound infection. Was receiving Ancef PTA and has required a few antibiotic adjustments throughout the course due to inability to tolerate them,  i.e ceftriaxone. eCrCl 80-90 ml/min.  Goal of Therapy:  Resolution of infection  Plan:  Cefazolin 2 g IV q8h  Monitor duration of abx  Pharmacy will sign off as no dose adjustments anticipated, Please re-consult as needed    Agapito GamesAlison Noelene Gang, PharmD, BCPS Clinical Pharmacist Pager: [phone] 03/26/2015 4:17 PM

## 2015-03-26 NOTE — ED Notes (Signed)
MD notified of pt refusing to wear oxygen.

## 2015-03-26 NOTE — Progress Notes (Signed)
Patient ID: Corey Gilbert, male   DOB: Sep 24, 1965, 49 y.o.   MRN: [id number]         [facility]    Date of Admission:  03/26/2015           Day 38 cefazolin       Reason for Consult: Persistent nausea, vomiting, shortness of breath and chest pain while receiving IV cefazolin for MSSA lumbar infection    Referring Physician: Dr. Gerhard Munchobert Lockwood  Principal Problem:   Shortness of breath at rest, after antibiotic therapy Active Problems:   Chest pain with moderate risk for cardiac etiology   Nausea with vomiting   Chronic hepatitis C   Wound infection after surgery   Affective bipolar disorder   Anxiety   Bundle branch block, right   Elevated troponin   Thrombocytopenia   Hyponatremia     Recommendations: 1. Discontinue cefazolin and observe off of antibiotics 2. Remove PICC 3. Blood cultures   Assessment: Corey Gilbert may be having an adverse reaction to cefazolin but I'm also concerned about possible PICC infection. I do not see any evidence of persistent lumbar infection causing the symptoms. Since he is almost completed 6 weeks of therapy I will stop cefazolin now and observe off of antibiotics pending results of blood cultures. I think there is also a component of anxiety.    HPI: Corey Gilbert is a 49 y.o. male who underwent lumbar fusion on 01/23/2015. About 3 weeks postoperatively he developed some wound drainage and was readmitted on 02/16/2015. He underwent incision and drainage. Operative cultures grew MSSA. He was seen by my partner, Dr. Enedina FinnerJeff Hatcher, who recommended 6 weeks of IV cefazolin. Shortly after discharge he began to develop nausea and vomiting that he related to the cefazolin. He was given anti-medics and had the cefazolin changed to ceftriaxone. However he felt worse and changed back to cefazolin which he has continued to take. He and his wife described that each time he infuse his cefazolin he will get dizzy, develope nausea and  sometimes vomiting and occasionally confusion. He will often have shaking chills and sweats. His highest recorded temperature is 100.  More recently he began to develop some shortness of breath during infusion of cefazolin. He also had chest pain that would worsen with a deep breath or cough along with dry cough. They came to the emergency room today because the shortness of breath and chest pain seemed to be getting worse. Because of concerns that he was having a reaction to cefazolin he has not taken any of his doses today. He states that he is already feeling better. He was able to eat lunch and has not had any nausea or vomiting. He states that he is feeling less short of breath.  He has had a VAC wound dressing on his lumbar wound. He has a home nurse who has been changing the dressing 3 times weekly. He has been told that the wound is improving and decreasing in size. His nurse has not said anything about any signs of wound infection.   Review of Systems: Constitutional: positive for anorexia, chills, fevers and sweats, negative for weight loss Eyes: negative Ears, nose, mouth, throat, and face: negative Respiratory: positive for cough, dyspnea on exertion and pleurisy/chest pain, negative for hemoptysis, sputum and wheezing Cardiovascular: positive for chest pain Gastrointestinal: positive for abdominal pain, nausea and vomiting, negative for diarrhea Genitourinary:negative  Past Medical History  Diagnosis Date  . Mental disorder  Bi- Polar  . Complication of anesthesia   . PONV (postoperative nausea and vomiting)   . Near syncope     due to opain, last time  early Sept 2013  . Depression   . Hepatitis     Hepatitis C  . Concussion   . Kidney stones   . Arthritis   . Family history of anesthesia complication     pon& v  . Bipolar disorder     History  Substance Use Topics  . Smoking status: Never Smoker   . Smokeless tobacco: Not on file  . Alcohol Use: No      Comment: rarely    Family History  Problem Relation Age of Onset  . Arrhythmia Mother    Allergies  Allergen Reactions  . Toradol [Ketorolac Tromethamine] Other (See Comments)    Reacts with other medications; also, fever and mania  . Other Other (See Comments)    Antidepressants   . Acetaminophen Other (See Comments)    Avoids due to history of Hepatitis C  . Baclofen Other (See Comments)    Goes manic  . Gabapentin Other (See Comments)    Goes manic  . Ibuprofen Other (See Comments)    Goes manic  . Lyrica [Pregabalin] Other (See Comments)    Mania    OBJECTIVE: Blood pressure 158/102, pulse 102, temperature 97.9 F (36.6 C), temperature source Oral, resp. rate 15, SpO2 88 %. General: he is alert, comfortable and talkative. His wife and son are in the room. Skin: no rash. He has some normal redness at the PICC insertion site Lungs: clear Cor: regular S1 and S2 with no murmurs heard Abdomen: soft and nontender with normal bowel sounds Lumbar wound: Vac dressing in place  Lab Results Lab Results  Component Value Date   WBC 9.1 03/26/2015   HGB 12.0* 03/26/2015   HCT 34.5* 03/26/2015   MCV 82.5 03/26/2015   PLT 78* 03/26/2015    Lab Results  Component Value Date   CREATININE 0.78 03/26/2015   BUN 14 03/26/2015   NA 131* 03/26/2015   K 3.5 03/26/2015   CL 95* 03/26/2015   CO2 27 03/26/2015    Lab Results  Component Value Date   ALT 9* 03/26/2015   AST 33 03/26/2015   ALKPHOS 66 03/26/2015   BILITOT 0.3 03/26/2015     Microbiology: No results found for this or any previous visit (from the past 240 hour(s)).  Cliffton Asters, MD [facility] [phone] pager   [phone] cell 03/26/2015, 6:46 PM

## 2015-03-26 NOTE — Telephone Encounter (Signed)
Patient's wife called to let the physicians know he is in the ED.  She wants Dr. Ninetta LightsHatcher specifically to know that they are there.  RN advised patient's wife that Dr. Ninetta LightsHatcher is at a different hospital, but that there is an [facility] physician on [facility].  She is upset that she feels her concerns haven't been addressed by Dr. Ninetta LightsHatcher.   Patient is still on Cefazolin, but they did not do this morning's dose due to their concerns.  He has NOT been changed to vancomycin as of yet. Please advise.

## 2015-03-26 NOTE — ED Notes (Signed)
Ambulated pt. Pt maintain 02 between 95-100%.

## 2015-03-26 NOTE — Progress Notes (Signed)
RN paged this NP earlier in shift because pt wanted something for sleep, anxiety and additional pain medication for back pain. This NP spoke with RN and she related that pt said he was "bipolar" and needed something for nerves and to help him sleep. NP reviewed chart which states pt does not take any meds for psych issues at home. No psych meds on PTA list. This was told to RN. She then stated pt said he doesn't take anything at home but "controls" his symptoms there without meds but hospitalization causes acute anxiety and sleep issues. Ambien and Ativan were ordered by this NP. No further pain medication prescribed at that time because pt is not here for any acute back pain problems and attending has already ordered his home pain med regimen.  RN then paged this NP again later stating pt was threatening to leave AMA if he didn't get Dilaudid IV for his back pain. Again, I discussed with the RN that the pt was not here for any acute issues with his back and he is on what is prescribed for his chronic back pain per PTA list. This NP questioned RN and she states pt is having no chest pain or pain with breathing, just "back pain". RN states pt said he "can feel the scar tissue in his back".  Chart reviewed and noted hospitalizations for post op lumbar surgery infection in May and June.  However, pt was admitted today with c/o n/v which were thought to be secondary to abx associated with his postop infection as stated above (on 6 weeks of abx per ID). ID has seen the pt and d/c'd abx at this time to observe pt off treatment. PICC line was removed today to send for culture to r/o infection of the line. Pt was not found to be septic on admission.  Pt's troponin was elevated on admission and cardiology was consulted. Troponins have trended down. CT chest neg for PE. Echo was ordered for the am. Pt has had no c/o CP tonight.  This NP reviewed chart notes as far back as May. Several notes in chart about pt having  issues with pain medication regimen in past, including, firing his RN for being "late" with his meds.  There are also references to him being non compliant with care. He has refused to wear his O2 here even though he was aware that his O2 sats were low at times.  This NP does not feel pt has an acute diagnosis that warrants the use of IV narcotics at this time. Apparently, dilaudid was not felt necessary by the admitting attending, as no Dilaudid was ordered on admission either. He was given his home regimen on admission. Pt left AMA.  Jimmye NormanKaren Kirby-Graham, NP [facility]

## 2015-03-26 NOTE — ED Notes (Signed)
Patient transported to CT 

## 2015-03-26 NOTE — ED Notes (Signed)
Pt reports SOB that is worse with exertion. Pt reports tightness and sharp pain in sternum that started 2 days ago. Pt states that this happens when he is on the antibiotic therapy for a staph infection

## 2015-03-26 NOTE — ED Notes (Signed)
Admitting at bedside 

## 2015-03-26 NOTE — ED Notes (Signed)
Cardiology at bedside.

## 2015-03-26 NOTE — ED Provider Notes (Signed)
CSN: [id number]     Arrival date & time 03/26/15  [id number] History   First MD Initiated Contact with Patient 03/26/15 [phone]     Chief Complaint  Patient presents with  . Shortness of Breath     (Consider location/radiation/quality/duration/timing/severity/associated sxs/prior Treatment) HPI Patient presents with concern of ongoing dyspnea, chest pain, sleeplessness, anxiousness, vomiting. Patient is a notable history of complicated lumbar surgery, with subsequent abscess. Patient is currently receiving IV antibiotics for a recently incised wound. Patient notes that over the past few days, as he has been regularly taking his cefazolin via right-sided PICC line, he has had increasing dyspnea, ongoing chest pain, worse with exertion, as well as sweats, lightheadedness, nausea, vomiting. Minimal relief with skipping dosage, otherwise no clear alleviating orogastric factors.  Exertion, or activity. Patient now has exertional dyspnea, exertional chest pain with minimal activity.  Past Medical History  Diagnosis Date  . Mental disorder     Bi- Polar  . Complication of anesthesia   . PONV (postoperative nausea and vomiting)   . Near syncope     due to opain, last time  early Sept 2013  . Depression   . Hepatitis     Hepatitis C  . Concussion   . Kidney stones   . Arthritis   . Family history of anesthesia complication     pon& v  . Bipolar disorder    Past Surgical History  Procedure Laterality Date  . Wrist [facility]  2013    [facility]  . Wrist surgery  2000    right  . Lumbar laminectomy/decompression microdiscectomy  06/14/2012    Procedure: LUMBAR LAMINECTOMY/DECOMPRESSION MICRODISCECTOMY 1 LEVEL;  Surgeon: Karn CassisErnesto M Botero, MD;  Location: [facility];  Service: Neurosurgery;  Laterality: Left;  Left Lumbar Removal of Fragment/Laminectomy Lumbar Three-Four  . [facility]  [facility]    [facility]  . Sp arthro thumb*l*  2013  left  2000 right    bilateral tendon  release.   Marland Kitchen. Anterior  cervical decomp/discectomy fusion  09/27/2012    Procedure: ANTERIOR CERVICAL DECOMPRESSION/DISCECTOMY FUSION 2 LEVELS;  Surgeon: Karn CassisErnesto M Botero, MD;  Location: [facility];  Service: Neurosurgery;  Laterality: N/A;  Cervical four-five, Cervical five-six,  Anterior cervical decompression/diskectomy/fusion/plate  . Lumbar wound debridement N/A 02/16/2015    Procedure: LUMBAR WOUND DEBRIDEMENT;  Surgeon: Coletta MemosKyle Cabbell, MD;  Location: [facility];  Service: Neurosurgery;  Laterality: N/A;   No family history on file. History  Substance Use Topics  . Smoking status: Never Smoker   . Smokeless tobacco: Not on file  . Alcohol Use: No     Comment: rarely    Review of Systems  Constitutional:       Per HPI, otherwise negative  HENT:       Per HPI, otherwise negative  Respiratory:       Per HPI, otherwise negative  Cardiovascular:       Per HPI, otherwise negative  Gastrointestinal: Positive for vomiting.  Endocrine:       Negative aside from HPI  Genitourinary:       Neg aside from HPI   Musculoskeletal:       Per HPI, otherwise negative  Skin: Negative.   Neurological: Negative for syncope.      Allergies  Toradol; Other; Acetaminophen; Baclofen; Gabapentin; Ibuprofen; and Lyrica  Home Medications   Prior to Admission medications   Medication Sig Start Date End Date Taking? Authorizing Provider  aspirin-acetaminophen-caffeine (EXCEDRIN MIGRAINE) [phone] MG  per tablet Take 1 tablet by mouth every 6 (six) hours as needed for migraine.     Historical Provider, MD  cyclobenzaprine (FLEXERIL) 10 MG tablet Take 10 mg by mouth every 8 (eight) hours as needed. pain 01/28/15   Historical Provider, MD  Ledipasvir-Sofosbuvir 90-400 MG TABS Take 1 tablet by mouth every evening.     Historical Provider, MD  Multiple Vitamin (MULTIVITAMIN WITH MINERALS) TABS Take 1 tablet by mouth daily.    Historical Provider, MD  ondansetron (ZOFRAN) 4 MG tablet Take 1 tablet (4 mg total) by mouth 3  (three) times daily as needed for nausea or vomiting. 03/12/15   Gardiner Barefoot, MD  oxyCODONE (ROXICODONE) 15 MG immediate release tablet Take 15 mg by mouth every 4 (four) hours as needed. pain 02/01/15   Historical Provider, MD  Oxycodone HCl 10 MG TABS Take 10 mg by mouth 5 (five) times daily.    Historical Provider, MD   BP 145/110 mmHg  Temp(Src) 97.9 F (36.6 C) (Oral)  Resp 22  SpO2 83% Physical Exam  Constitutional: He is oriented to person, place, and time. He appears well-developed. He appears distressed.  Very uncomfortable appearing male, awake and alert, answering questions appropriately.  HENT:  Head: Normocephalic and atraumatic.  Eyes: Conjunctivae and EOM are normal.  Cardiovascular: Normal rate and regular rhythm.   Pulmonary/Chest: Effort normal. No stridor. No respiratory distress.  Abdominal: He exhibits no distension.  Musculoskeletal: He exhibits no edema.       Arms: Neurological: He is alert and oriented to person, place, and time. He displays no atrophy and no tremor. No cranial nerve deficit or sensory deficit. He exhibits normal muscle tone. He displays no seizure activity.  Skin: Skin is warm. He is diaphoretic.  Psychiatric: He has a normal mood and affect.  Nursing note and vitals reviewed.   ED Course  Procedures (including critical care time) Labs Review Labs Reviewed  CBC - Abnormal; Notable for the following:    RBC 4.18 (*)    Hemoglobin 12.0 (*)    HCT 34.5 (*)    Platelets 78 (*)    All other components within normal limits  COMPREHENSIVE METABOLIC PANEL - Abnormal; Notable for the following:    Sodium 131 (*)    Chloride 95 (*)    Glucose, Bld 129 (*)    Calcium 8.8 (*)    Albumin 3.2 (*)    ALT 9 (*)    All other components within normal limits  TROPONIN I - Abnormal; Notable for the following:    Troponin I 0.65 (*)    All other components within normal limits  MAGNESIUM  PROTIME-INR  TROPONIN I  TROPONIN I  TROPONIN I    I-STAT CG4 LACTIC ACID, ED    Imaging Review Dg Chest Port 1 View  03/26/2015   CLINICAL DATA:  49 year old male with left side chest and upper quadrant pain with shortness of breath for 2 days. Initial encounter.  EXAM: PORTABLE CHEST - 1 VIEW  COMPARISON:  [facility] chest radiograph 04/24/2008.  FINDINGS: Portable AP upright view at 1217 hrs. Right side PICC line in place, catheter tip projects at the level of the carina. Partially visible cervical ACDF hardware. Lung volumes remain normal. Normal cardiac size and mediastinal contours. Visualized tracheal air column is within normal limits. No pneumothorax, pulmonary edema, pleural effusion or confluent pulmonary opacity.  IMPRESSION: No acute cardiopulmonary abnormality. Right PICC line catheter in place.   Electronically Signed  By: Odessa Fleming M.D.   On: 03/26/2015 12:46     EKG Interpretation   Date/Time:  Tuesday March 26 2015 09:47:29 EDT Ventricular Rate:  109 PR Interval:  126 QRS Duration: 122 QT Interval:  410 QTC Calculation: 552 R Axis:   85 Text Interpretation:  Sinus tachycardia Right bundle branch block Abnormal  ECG Sinus tachycardia Artifact ST-t wave abnormality Abnormal ekg  Confirmed by Gerhard Munch  MD [phone]) on 03/26/2015 9:57:37 AM       Chart review demonstrates multiple directions with neurosurgery, infectious disease to address his MSSA culture positive lumbar wound  MRI February 20, 2015 IMPRESSION: 1. No visible epidural or soft tissue abscess. However, much detail is obscured at the operative the level of the metallic hardware. 2. Small fluid collections at the site of the laminectomies at L4-5 consistent with postoperative seromas. Given the adjacent soft tissue wound, tonight extruded infection in these fluid collections but there is no appreciable abnormal enhancement.     Electronically Signed   By: Francene Boyers M.D.   On: 02/20/2015 08:43  12:34 PM Pain only intermittently  controled Patient has + troponin.  I have discussed the patient w Cardiology  Though the patient has minimal risk profile for PE, other than indwelling line, elevated troponin, with nonischemic EKG, is concerning for thromboembolic processes, CT scan ordered.  I reviewed the images myself - agree with the interpretation.   I discussed patient's case with our infectious disease colleagues, as well as with the hospitalist team for admission.   MDM  Patient with chronic IV antibiotics used now presents with ongoing chest pain, dyspnea. Here the patient is tachypneic, tachycardic, with mild hypoxia. Patient has CT scan with some suggestion for atypical pneumonia, but is afebrile, no lactic acidosis, leukocytosis. Patient's pain was intermittently controlled with analgesia. Patient nonischemic ECG, but elevated troponin. Cardiology has been following the patient's case as well. Given the comminution of the troponin, concern for atypical pneumonia, hypoxia, need for ongoing IV therapeutics, the patient was admitted for further evaluation and management.  Gerhard Munch, MD 03/27/15 [phone]

## 2015-03-26 NOTE — H&P (Signed)
[facility] History and Physical                                                                                    Corey Gilbert, is a 49 y.o. male  MRN: [id number]   DOB - March 02, 1966  Admit Date - 03/26/2015  Outpatient Primary MD for the patient is Pcp Not In System  Referring MD: Jeraldine Loots / ER  Consulting M.D: Elease Hashimoto / Cardiology; ID has been called by EDP/[place]  With History of -  Past Medical History  Diagnosis Date  . Mental disorder     Bi- Polar  . Complication of anesthesia   . PONV (postoperative nausea and vomiting)   . Near syncope     due to opain, last time  early Sept 2013  . Depression   . Hepatitis     Hepatitis C  . Concussion   . Kidney stones   . Arthritis   . Family history of anesthesia complication     pon& v  . Bipolar disorder       Past Surgical History  Procedure Laterality Date  . Wrist surgery  2013    left at [facility]  . Wrist surgery  2000    right  . Lumbar laminectomy/decompression microdiscectomy  06/14/2012    Procedure: LUMBAR LAMINECTOMY/DECOMPRESSION MICRODISCECTOMY 1 LEVEL;  Surgeon: Karn Cassis, MD;  Location: [facility];  Service: Neurosurgery;  Laterality: Left;  Left Lumbar Removal of Fragment/Laminectomy Lumbar Three-Four  . Back surgery  2013    at [facility]  . Sp arthro thumb*l*  2013  left  2000 right    bilateral tendon  release.   Marland Kitchen Anterior cervical decomp/discectomy fusion  09/27/2012    Procedure: ANTERIOR CERVICAL DECOMPRESSION/DISCECTOMY FUSION 2 LEVELS;  Surgeon: Karn Cassis, MD;  Location: [facility];  Service: Neurosurgery;  Laterality: N/A;  Cervical four-five, Cervical five-six,  Anterior cervical decompression/diskectomy/fusion/plate  . Lumbar wound debridement N/A 02/16/2015    Procedure: LUMBAR WOUND DEBRIDEMENT;  Surgeon: Coletta Memos, MD;  Location: [facility];  Service: Neurosurgery;  Laterality: N/A;    in for   Chief Complaint  Patient presents with  . Shortness of Breath      HPI This is a 49 yo male patient with bipolar disorder, known hep C who has recently completed Harvoni, chronic low back pain status post lumbar discectomy at L3-4 level. For several years patient had been complaining of worsening of the back pain with radiation to both extremities with walking - x-rays revealed spondylolisthesis. He underwent subsequent bilateral L4 and L5 laminectomy with facetectomy and discectomy with pedicle screws and autograft in May 2016. He later developed postoperative wound infection in late May and was hospitalized and subsequently discharged on 6/2 after I/D of lumbar wound with placement of wound VAC, PICC line and initiation of outpatient IV antibiotics. Since that discharge patient has had significant issues with intolerance to IV antibiotics ranging from episodic diaphoresis, nausea/ vomiting, confusion and generalized malaise. The antibiotics have been changed at least twice without any improvement in these side effects. Note the patient has also been on Harvoni to treat his underlying hep C  and has recently completed this medication 2 weeks ago. For the past 4 days patient now has been experiencing progressive dyspnea on exertion associated with diaphoresis and intermittent nocturnal confusion. He has had a non-productive cough and has coughed excessively to the point of emesis. He has also had continued issues of post antibiotic infusion-related nausea and vomiting (he is currently on IV Ancef). He reports low-grade fevers and a "chemical smell" to his urine. He has had issues with malfunctioning PICC line since discharge and on one occasion this has required alteplase infusion. He is also having chest pain that appears to be more pleuritic in nature noticing this occurs primarily with taking a deep breath.  On presentation to the ER he was afebrile, hypertensive with BP in the 150s over 110s, he was mildly tachycardic with heart rates between 101 08. His room air  saturations were 92%. EKG revealed sinus tachycardia without any definitive ischemic changes and a right bundle branch block which when reviewed with records on care everywhere is chronic. Chest x-ray was unremarkable and because of patient's chest discomfort and tachycardia a CT angiogram of the chest was also ordered but did not reveal pulmonary emboli. There was mild diffuse interstitial prominence in the lungs and multiple small pulmonary nodules which may be chronic versus a pattern consistent with atypical pneumonia in the proper clinical setting. No lung consolidation to suggest lobar pneumonia was seen by the radiologist and no pulmonary edema was seen. Radiologist did recommend repeating CT at 3-6 months if patient is at high risk for bronchogenic carcinoma. Laboratory data was unremarkable except for mild hyponatremia with a sodium 131 and a glucose of 129 with albumen 3.2, troponin was elevated at 0.65 with a repeat reading of 0.49. Lactic acid was normal at 1.84. Patient has slightly progressive thrombocytopenia with platelet count 78,000 with previous readings in 138,000 and 116,000 respectively. Hemoglobin stable at 12. PT and INR were normal. Cardiology has evaluated the patient. EDP has consulted infectious disease who will be seeing the patient later.   Review of Systems   In addition to the HPI above,  No chills, myalgias or other constitutional symptoms No Headache, changes with Vision or hearing, new weakness, tingling, numbness in any extremity, No problems swallowing food or Liquids, indigestion/reflux No palpitations No Abdominal pain, N/V; no melena or hematochezia, no dark tarry stools, Bowel movements are regular, No dysuria, hematuria or flank pain No new skin rashes, lesions, masses or bruises, No new joints pains-aches No recent weight gain or loss No polyuria, polydypsia or polyphagia,  *A full 10 point Review of Systems was done, except as stated above, all other Review  of Systems were negative.  Social History History  Substance Use Topics  . Smoking status: Never Smoker   . Smokeless tobacco: Not on file  . Alcohol Use: No     Comment: rarely    Resides at: Private residence  Lives with: Wife and son  Ambulatory status: Without assistive devices  Employment status: Works as a Comptroller   Family History Family History  Problem Relation Age of Onset  . Arrhythmia Mother      Prior to Admission medications   Medication Sig Start Date End Date Taking? Authorizing Provider  aspirin-acetaminophen-caffeine (EXCEDRIN MIGRAINE) [phone] MG per tablet Take 1 tablet by mouth every 6 (six) hours as needed for migraine.    Yes Historical Provider, MD  cyclobenzaprine (FLEXERIL) 10 MG tablet Take 10 mg by mouth every 8 (eight) hours as  needed. pain 01/28/15  Yes Historical Provider, MD  oxyCODONE (ROXICODONE) 15 MG immediate release tablet Take 15 mg by mouth every 4 (four) hours as needed. pain 02/01/15  Yes Historical Provider, MD  ondansetron (ZOFRAN) 4 MG tablet Take 1 tablet (4 mg total) by mouth 3 (three) times daily as needed for nausea or vomiting. 03/12/15   Gardiner Barefootobert W Comer, MD    Allergies  Allergen Reactions  . Toradol [Ketorolac Tromethamine] Other (See Comments)    Reacts with other medications; also, fever and mania  . Other Other (See Comments)    Antidepressants   . Acetaminophen Other (See Comments)    Avoids due to history of Hepatitis C  . Baclofen Other (See Comments)    Goes manic  . Gabapentin Other (See Comments)    Goes manic  . Ibuprofen Other (See Comments)    Goes manic  . Lyrica [Pregabalin] Other (See Comments)    Mania    Physical Exam  Vitals  Blood pressure 144/97, pulse 91, temperature 97.9 F (36.6 C), temperature source Oral, resp. rate 18, SpO2 97 %.   General:  In no acute distress, appears healthy and well nourished  Psych:  Normal affect, Denies Suicidal or Homicidal ideations,  Awake Alert, Oriented X 3. Speech and thought patterns are clear and appropriate, no apparent short term memory deficits  Neuro:   No focal neurological deficits, CN II through XII intact, Strength 5/5 all 4 extremities, Sensation intact all 4 extremities.  ENT:  Ears and Eyes appear Normal, Conjunctivae clear, PER. Moist oral mucosa without erythema or exudates.  Neck:  Supple, No lymphadenopathy appreciated  Respiratory:  Symmetrical chest wall movement, Good air movement bilaterally, CTAB. Room Air  Cardiac:  RRR, No Murmurs, no LE edema noted, no JVD, No carotid bruits, peripheral pulses palpable at 2+  Abdomen:  Positive bowel sounds, Soft, Non tender, Non distended,  No masses appreciated, no obvious hepatosplenomegaly  Skin:  No Cyanosis, Normal Skin Turgor, No Skin Rash or Bruise. Surgical site on back examined with intact wound VAC and no evidence of peri wound redness or purulent drainage exuding around wound VAC dressing  Extremities: Symmetrical without obvious trauma or injury,  no effusions.  Data Review  CBC  Recent Labs Lab 03/26/15 1050  WBC 9.1  HGB 12.0*  HCT 34.5*  PLT 78*  MCV 82.5  MCH 28.7  MCHC 34.8  RDW 13.6    Chemistries   Recent Labs Lab 03/26/15 1050  NA 131*  K 3.5  CL 95*  CO2 27  GLUCOSE 129*  BUN 14  CREATININE 0.78  CALCIUM 8.8*  MG 2.0  AST 33  ALT 9*  ALKPHOS 66  BILITOT 0.3    CrCl cannot be calculated (Unknown ideal weight.).  No results for input(s): TSH, T4TOTAL, T3FREE, THYROIDAB in the last 72 hours.  Invalid input(s): FREET3  Coagulation profile  Recent Labs Lab 03/26/15 1050  INR 1.17    No results for input(s): DDIMER in the last 72 hours.  Cardiac Enzymes  Recent Labs Lab 03/26/15 1050 03/26/15 1536  TROPONINI 0.65* 0.49*    Invalid input(s): POCBNP  Urinalysis No results found for: COLORURINE, APPEARANCEUR, LABSPEC, PHURINE, GLUCOSEU, HGBUR, BILIRUBINUR, KETONESUR, PROTEINUR,  UROBILINOGEN, NITRITE, LEUKOCYTESUR  Imaging results:   Ct Angio Chest Pe W/cm &/or Wo Cm  03/26/2015   CLINICAL DATA:  Pt reports SOB that is worse with exertion. Pt reports tightness and sharp pain in sternum that started 2 days ago. Pt  states that this happens when he is on the antibiotic therapy for a staph infection  EXAM: CT ANGIOGRAPHY CHEST WITH CONTRAST  TECHNIQUE: Multidetector CT imaging of the chest was performed using the standard protocol during bolus administration of intravenous contrast. Multiplanar CT image reconstructions and MIPs were obtained to evaluate the vascular anatomy.  CONTRAST:  OMNIPAQUE IOHEXOL 350 MG/ML SOLN  COMPARISON:  Current chest radiograph  FINDINGS: Angiographic study: No evidence of a pulmonary embolus. Thoracic aorta is normal in caliber. No evidence of dissection.  Thoracic inlet: Prominent shotty lymph nodes at the left neck base and in the left axilla. Largest neck base node measures 1 cm short axis. Largest axillary node measures 1 cm short axis. No neck base masses.  Mediastinum and hila: Several prominent to mildly enlarged lymph nodes. There is a left para carinal node measuring 12 mm in short axis and a right peritracheal, azygos level node measuring 12 mm short axis. There is also increased soft tissue along the hila consistent with prominent ill-defined lymph nodes. Heart is normal in size minor coronary artery calcifications are noted. Trace pericardial.  Lungs and pleural: Diffuse, irregular thickening of the interstitial markings appear there are several small nodules. There is a 4.5 mm nodule in the right upper lobe, image 21, series 6, 3-4 mm nodule in the right upper lobe anteriorly and medially adjacent to the minor fissure, image 53, similar slice pleural-based nodule in the anteromedial right middle lobe, image 62, 2 mm nodule in the left upper lobe, image 25, ill-defined 5 mm nodule in left upper lobe, image 40, 3 mm nodule in the left upper  lobe lingula, image 58, 3-4 mm nodule in the left lower lobe, image 58, as well as multiple other tiny nodules. There is focal opacity adjacent to the right oblique fissure, image 50, measuring 8 mm in longest dimension, with a smaller similar area of opacity adjacent to this along an accessory fissure. There are no areas of lung consolidation to suggest pneumonia. No pulmonary edema. There is mild basilar subsegmental atelectasis. No pleural effusion or pneumothorax.  Limited upper abdomen: Low-density right upper pole renal lesion consistent with a cyst. Small stone in the upper pole of the left kidney.  Musculoskeletal: Minor degenerative spurring along the thoracic spine. No osteoblastic or osteolytic lesions.  Review of the MIP images confirms the above findings.  IMPRESSION: 1. No evidence of a pulmonary embolus. 2. Mild diffuse interstitial prominence in the lungs in multiple small pulmonary nodules. Findings may be chronic. Consider atypical pneumonia in the proper clinical setting. 3. No lung consolidation to suggest lobar pneumonia. No evidence of pulmonary edema. 4. With regard to the pulmonary nodules, the following is recommended: If the patient is at high risk for bronchogenic carcinoma, follow-up chest CT at 3-44months is recommended. If the patient is at low risk for bronchogenic carcinoma, follow-up chest CT at 6-12 months is recommended. This recommendation follows the consensus statement: Guidelines for Management of Small Pulmonary Nodules Detected on CT Scans: A Statement from the Fleischner Society as published in Radiology 2005; 237:395-400.   Electronically Signed   By: Amie Portland M.D.   On: 03/26/2015 15:59   Dg Chest Port 1 View  03/26/2015   CLINICAL DATA:  49 year old male with left side chest and upper quadrant pain with shortness of breath for 2 days. Initial encounter.  EXAM: PORTABLE CHEST - 1 VIEW  COMPARISON:  [facility] chest radiograph 04/24/2008.  FINDINGS: Portable AP  upright view  at 1217 hrs. Right side PICC line in place, catheter tip projects at the level of the carina. Partially visible cervical ACDF hardware. Lung volumes remain normal. Normal cardiac size and mediastinal contours. Visualized tracheal air column is within normal limits. No pneumothorax, pulmonary edema, pleural effusion or confluent pulmonary opacity.  IMPRESSION: No acute cardiopulmonary abnormality. Right PICC line catheter in place.   Electronically Signed   By: Odessa Fleming M.D.   On: 03/26/2015 12:46     EKG: (Independently reviewed) sinus tachycardia with right bundle branch block and no ischemic changes   Assessment & Plan  Principal Problem:   Shortness of breath at rest, after antibiotic therapy -Admit to telemetry -Patient not hypoxic at rest; check ambulatory saturations -May have atypical pneumonia based on CT scan-consider treating as HCAP versus viral pneumonitis; currently patient afebrile and without leukocytosis although this is in setting of IV Ancef at home -No evidence of pulmonary embolism on CT angiogram -ID consulted to assist with antibiotic management and adjustment or change in medication  Active Problems:   Chest pain with moderate risk for cardiac etiology/elevated troponin -Heart score =4 -Troponin elevated and trending down -Had chest pain which was more pleuritic in nature -Agree with echocardiogram and cycling troponin; reviewed information in care everywhere and patient last had echocardiogram at [facility] in 2013 that showed preserved LVEF 55% and normal LV wall and no valvular disease -Cardiology has been consulted and recommended if CT angiogram was negative for PE patient would likely need nuclear medicine stress test    Chronic hepatitis C -Patient and wife report that 2 weeks ago patient completed Harvoni therapy and is now "cured" -In review of the literature Harvoni can cause nausea vomiting abdominal pain and diarrhea    Thrombocytopenia -Patient  has had a degree of thrombocytopenia dating that 2013 but current platelets are lower than previous readings -No pharmacological DVT prophylaxis given platelets less than 100,000 -Unclear if post Harvoni if or when platelet count should recover     Hyponatremia -Unclear if related to volume depletion, side effects of medications or from 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 -Follow trend    Affective bipolar disorder -Does not take medications at home -Apparently has a history of self-medicating with polypharmacy and use of THC in the past -Check urine drug screen    Bundle branch block, 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 aiding back to 2013    Wound infection after surgery -ID evaluation pending -Continue wound VAC as prior to admission -Unclear at this juncture if continued antibiotics are indicated noted patient had apparent MSSA positive wound cultures last month; no blood cultures obtained so obviously no bacteremia -Continue IV Ancef or antibiotics as recommended by ID    DVT Prophylaxis: SCDs  Family Communication:   Wife and son at bedside  Code Status:  Full code  Condition:  Stable  Discharge disposition: Anticipate discharge back to home once nausea vomiting, dyspnea and chest pain evaluation completed  Time spent in minutes : 60      Treonna Klee L. ANP on 03/26/2015 at 4:55 PM  Between 7am to 7pm - Pager - [phone]  After 7pm go to [email or web address] - password [email or web address]  And look for the night coverage person covering me after hours  [facility]

## 2015-03-26 NOTE — ED Notes (Signed)
Pt called about monitor ringing off and sats were 86-88%/RA. Changed probe. Sats increased to 90-92.  Pt denies sob and refuses to wear oxygen. Informed primary RN, Nehemiah SettleBrooke T. About notifying MD.

## 2015-03-26 NOTE — Progress Notes (Signed)
Patient instructed to remain in bed x 30 minutes post PICC removal, call staff if starts bleeding.  Instructed to leave dressing on for 24-48 hours, do not get wet.  Patient verbalized understanding of instructions.  Corey Gilbert, [name], RN VAST

## 2015-03-26 NOTE — Consult Note (Signed)
CARDIOLOGY CONSULT NOTE   Patient ID: Randon Goldsmithlan Spikes MRN: [id number] DOB/AGE: April 07, 1966 49 y.o.  Admit date: 03/26/2015  Primary Physician   Pcp Not In System Primary Cardiologist   New Reason for Consultation   Elevated troponin  WUJ:WJXBHPI:Renold Sharol HarnessSimmons is a 49 y.o. year old male with a history of bipolar disorder, not currently on medications, lumbar fusion in early May with admission for a wound infection in early June. Discharge 02/21/2015.   Since then, he has been on IV antibiotics. He has had multiple side effects because of the antibiotics including episodic diaphoresis, nausea and vomiting, confusion and general malaise. The antibiotics have been changed a couple of times, but he is still experiencing significant side effects. His wife notes that he has wakened at least once with vomit in his mouth  Starting at least 5 days ago, he began waking with shortness of breath. He was getting these episodes every day, but cannot say for sure how long they would last. They are prolonged. He began noticing increased dyspnea on exertion with minor things such as walking across the room.  3 days ago, he developed chest pain. It is in the mid epigastric region. It hurts worse with deep inspiration. When he takes a deep breath, he coughs. It is not made worse by exertion. It has been constant since then. At its worst, and 8 or 9/10. He has gotten minor relief with pain control medications and also a little relief with a GI cocktail in the emergency room. However, the pain is still significant.  Today, the shortness of breath became worse and he felt like the episodes of shortness of breath were merging. He was concerned and came to the emergency room. In the emergency room, he has had alteplase for his PICC line, a GI cocktail, and Dilaudid totaling 2 mg. He is still having pain, but is a little more comfortable.   Past Medical History  Diagnosis Date  . Mental disorder     Bi- Polar  .  Complication of anesthesia   . PONV (postoperative nausea and vomiting)   . Near syncope     due to opain, last time  early Sept 2013  . Depression   . Hepatitis     Hepatitis C  . Concussion   . Kidney stones   . Arthritis   . Family history of anesthesia complication     pon& v  . Bipolar disorder      Past Surgical History  Procedure Laterality Date  . W[facility]  [facility]    [facility]  . Wrist surgery  2000    right  . Lumbar laminectomy/decompression microdiscectomy  06/14/2012    Procedure: LUMBAR LAMINECTOMY/DECOMPRESSION MICRODISCECTOMY 1 LEVEL;  Surgeon: Karn CassisErnesto M Botero, MD;  Location: [facility];  Service: Neurosurgery;  Laterality: Left;  Left Lumbar Removal of Fragment/Laminectomy Lumbar Three-Four  . [facility]  . Sp arthro thumb*l*  2013  left  2000 right    bilateral tendon  release.   Marland Kitchen. Anterior cervical decomp/discectomy fusion  09/27/2012    Procedure: ANTERIOR CERVICAL DECOMPRESSION/DISCECTOMY FUSION 2 LEVELS;  Surgeon: Karn CassisErnesto M Botero, MD;  Location: [facility];  Service: Neurosurgery;  Laterality: N/A;  Cervical four-five, Cervical five-six,  Anterior cervical decompression/diskectomy/fusion/plate  . Lumbar wound debridement N/A 02/16/2015    Procedure: LUMBAR WOUND DEBRIDEMENT;  Surgeon: Coletta MemosKyle Cabbell, MD;  Location: [facility];  Service: Neurosurgery;  Laterality: N/A;    Allergies  Allergen Reactions  . Toradol [Ketorolac Tromethamine] Other (See Comments)    Reacts with other medications; also, fever and mania  . Other Other (See Comments)    Antidepressants   . Acetaminophen Other (See Comments)    Avoids due to history of Hepatitis C  . Baclofen Other (See Comments)    Goes manic  . Gabapentin Other (See Comments)    Goes manic  . Ibuprofen Other (See Comments)    Goes manic  . Lyrica [Pregabalin] Other (See Comments)    Mania    I have reviewed the patient's current medications Prior to Admission medications     Medication Sig Start Date End Date Taking? Authorizing Provider  aspirin-acetaminophen-caffeine (EXCEDRIN MIGRAINE) [phone] MG per tablet Take 1 tablet by mouth every 6 (six) hours as needed for migraine.    Yes Historical Provider, MD  cyclobenzaprine (FLEXERIL) 10 MG tablet Take 10 mg by mouth every 8 (eight) hours as needed. pain 01/28/15  Yes Historical Provider, MD  ondansetron (ZOFRAN) 4 MG tablet Take 1 tablet (4 mg total) by mouth 3 (three) times daily as needed for nausea or vomiting. 03/12/15  Yes Gardiner Barefoot, MD  oxyCODONE (ROXICODONE) 15 MG immediate release tablet Take 15 mg by mouth every 4 (four) hours as needed. pain 02/01/15  Yes Historical Provider, MD     History   Social History  . Marital Status: Married    Spouse Name: N/A  . Number of Children: N/A  . Years of Education: N/A   Occupational History  . Unemployed [place]    Social History Main Topics  . Smoking status: Never Smoker   . Smokeless tobacco: Not on file  . Alcohol Use: No     Comment: rarely  . Drug Use: No  . Sexual Activity: Yes   Other Topics Concern  . Not on file   Social History Narrative   Lives in wife and son in [place].     Family Status  Relation Status Death Age  . Mother Alive   . Father Alive    Family History  Problem Relation Age of Onset  . Arrhythmia Mother      ROS: No BM in 2-3 days, unusual for him. Full 14 point review of systems complete and found to be negative unless listed above.  Physical Exam: Blood pressure 143/114, pulse 100, temperature 97.9 F (36.6 C), temperature source Oral, resp. rate 14, SpO2 95 %.  General: Well developed, well nourished, male in no acute distress Head: Eyes PERRLA, No xanthomas.   Normocephalic and atraumatic, oropharynx without edema or exudate. Dentition: good Lungs: few rales R base, good air exchange Heart: HRRR S1 S2, no rub/gallop, 2/6 murmur. pulses are 2+ all 4 extrem.   Neck: No carotid bruits. No  lymphadenopathy.  JVD not elevated. Abdomen: Bowel sounds present, abdomen soft and non-tender without masses or hernias noted. Msk:  No spine or cva tenderness. No weakness, no joint deformities or effusions. Extremities: No clubbing or cyanosis. No edema.  Neuro: Alert and oriented X 3. No focal deficits noted. Psych:  Good affect, responds appropriately Skin: No rashes or lesions noted.  Labs:   Lab Results  Component Value Date   WBC 9.1 03/26/2015   HGB 12.0* 03/26/2015   HCT 34.5* 03/26/2015   MCV 82.5 03/26/2015   PLT 78* 03/26/2015    Recent Labs  03/26/15 1050  INR 1.17     Recent  Labs Lab 03/26/15 1050  NA 131*  K 3.5  CL 95*  CO2 27  BUN 14  CREATININE 0.78  CALCIUM 8.8*  PROT 7.4  BILITOT 0.3  ALKPHOS 66  ALT 9*  AST 33  GLUCOSE 129*  ALBUMIN 3.2*   MAGNESIUM  Date Value Ref Range Status  03/26/2015 2.0 1.7 - 2.4 mg/dL Final    Recent Labs  16/10/96 1050  TROPONINI 0.65*    ECG: SR, RBBB (no old)  Radiology:  Dg Chest Port 1 View 03/26/2015   CLINICAL DATA:  49 year old male with left side chest and upper quadrant pain with shortness of breath for 2 days. Initial encounter.  EXAM: PORTABLE CHEST - 1 VIEW  COMPARISON:  [facility] chest radiograph 04/24/2008.  FINDINGS: Portable AP upright view at 1217 hrs. Right side PICC line in place, catheter tip projects at the level of the carina. Partially visible cervical ACDF hardware. Lung volumes remain normal. Normal cardiac size and mediastinal contours. Visualized tracheal air column is within normal limits. No pneumothorax, pulmonary edema, pleural effusion or confluent pulmonary opacity.  IMPRESSION: No acute cardiopulmonary abnormality. Right PICC line catheter in place.   Electronically Signed   By: Odessa Fleming M.D.   On: 03/26/2015 12:46    ASSESSMENT AND PLAN:   The patient was seen today by Dr Elease Hashimoto, the patient evaluated and the data reviewed.  Principal Problem:   Shortness of breath at  rest, after antibiotic therapy - concern for PE due to PICC line and decreased activity after surgery  Active Problems:   Wound infection after surgery - Antibiotics per IM, consider ID consult since he has had problems tolerating the antibiotics    Chest pain with moderate risk for cardiac etiology/Elevated troponin - cycle enzymes, check an echo. - if enzymes trend down or stay flat, no PE, and EF normal, consider inpatient MV - If EF is abnormal, may need cath.    Thrombocytopenia - had plt count 99 in 05/2014 - range since then 113-138 - lowest reading is today, at 78 - No current bleeding issues    Bipolar d/o - he is not currently on any medications at home - Admits that he feels anxious and is agitated at times here in the ER - We'll give him a one-time dose of Ativan 0.5 mg, otherwise per IM  SignedTheodore Demark, PA-C 03/26/2015 2:22 PM Beeper [phone]  Co-Sign MD   Attending Note:   The patient was seen and examined.  Agree with assessment and plan as noted above.  Changes made to the above note as needed.  Very interesting case.  His primary issue seems to be intolerance of his antibiotic. He now presents with progressive symptoms of diaphoresis, tachycardia, nausea with the antibiotic infusion. Now has a mild Troponin without any ECG changes or symptoms to suggrest ACS.  ? Pulmonary embolus   Agree with CT angio for further evaluaton .  I would suggest that he stay overnight and get serial enzymes.  Would consider a stress myoview if the CT angio is negative for PE.     Vesta Mixer, Montez Hageman., MD, [facility] 03/26/2015, 3:26 PM [address],  [address] Office [phone] Pager [phone]

## 2015-03-27 ENCOUNTER — Telehealth: Payer: Self-pay | Admitting: *Deleted

## 2015-03-27 NOTE — Telephone Encounter (Signed)
[facility], IV ABX and PICC d/ced by Dr. Orvan Falconerampbell.  Wife asking whether there were any labs that that were not done due to the pt leaving the ED due to his bipolar symptoms.  RN  Reviewed that labs that were obtained and all were able to be processed.  The pt does have a follow-up appt for next week with Dr. Ninetta LightsHatcher. Per Dr. Blair Dolphinampbell's EPIC note, called [facility] to D/C IV antibiotics.

## 2015-03-31 LAB — AFB CULTURE WITH SMEAR (NOT AT ARMC): ACID FAST SMEAR: NONE SEEN

## 2015-03-31 LAB — CULTURE, BLOOD (ROUTINE X 2)
Culture: NO GROWTH
Culture: NO GROWTH

## 2015-04-03 ENCOUNTER — Ambulatory Visit (INDEPENDENT_AMBULATORY_CARE_PROVIDER_SITE_OTHER): Payer: Medicaid Other | Admitting: Infectious Diseases

## 2015-04-03 ENCOUNTER — Encounter: Payer: Self-pay | Admitting: Infectious Diseases

## 2015-04-03 VITALS — BP 148/108 | HR 71 | Temp 98.2°F | Wt 175.0 lb

## 2015-04-03 DIAGNOSIS — IMO0001 Reserved for inherently not codable concepts without codable children: Secondary | ICD-10-CM

## 2015-04-03 DIAGNOSIS — T814XXD Infection following a procedure, subsequent encounter: Secondary | ICD-10-CM | POA: Diagnosis not present

## 2015-04-03 NOTE — Assessment & Plan Note (Signed)
He is doing well. Will repeat his ESR although not sure of significance as this increased while he was on therapy and even then was in a minimal range.  Will keep him off anbx.  rtc prn Defer to Dr Joya Salm regarding repeat imaging.

## 2015-04-03 NOTE — Progress Notes (Signed)
   Subjective:    Patient ID: Corey Gilbert, male    DOB: 1966/03/17, 49 y.o.   MRN: [id number]  HPI 49 yo M with hx of bipolar and Hep C, he underwent L4/5 and L5/S1 lumbar fusion on 01-23-2015. He was wearing a back brace which he felt like was rubbing his wound. He returned to the hospital on 5-28 after he noticed wound d/c, fever, tenderness and erythema. He was afebrile with WBC 12.3 on adm.  He was taken to OR same day and his Cx have since grown MSSA. He was d/c on ancef, however he developed n/v, chills, dizziness with anbx infusion. He was changed to ceftriaxone however these issues persisted.  He was adm to hospital 7-5 and was taken off his anbx.   Review of Systems     Objective:   Physical Exam  Constitutional: He appears well-developed and well-nourished.  Musculoskeletal:       Arms:         Assessment & Plan:

## 2015-04-11 NOTE — Discharge Summary (Signed)
Physician Discharge Summary  Corey Gilbert AVW:[id number] DOB: Aug 15, 1966 DOA: 03/26/2015  PCP: Pcp Not In System  Admit date: 03/26/2015 Discharge date: 03/27/2015  Recommendations for Outpatient Follow-up:  1. Pt left AMA after my shift ended  Discharge Diagnoses:  Principal Problem:   Shortness of breath at rest, after antibiotic therapy Active Problems:   Affective bipolar disorder   Discharge Condition: Unknown as I was not on the shift   Diet recommendation: non discussed   History of present illness:   In brief: 49 yo male patient with bipolar disorder, known hep C who has recently completed Harvoni 2 weeks prior to this admission, s/p recent L4/L5 laminectomy in May 2016 and post operative wound infection in the lumbar area, requiring I&D and wound vac placement, was discharged home with IV ABX and PICC line, presented to [facility] ED with main concern of anterior chest discomfort. Blood work notable for troponin elevation up to 0.65. Cardiology team consulted and pt to be admitted to telemetry bed. While in ED pt also noted to become hypoxic with oxygen saturations in 80's on RA but has refused to be placed on oxygen as recommended. ED doctor also consulted ID team for assistance.   Assessment and plan: Active Problems:  Chest pain with moderate risk for cardiac etiology/elevated troponin - cardiology consulted - pt apparently left at night time AMA   Acute hypoxic resp failure - with oxygen saturations in 80's on RA and respiratory rate up to 26 at rest - possibly related to demand ischemia in the setting of elevated troponins  - pt refusing oxygen via Bent   Thrombocytopenia - Patient has had chronic thrombocytopenia dating that 2013 but current platelets are lower than previous readings - unclear etiology of this drop, Harvoni not reported to cause significant thrombocytopenia - ? 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 Pt reports tightness and sharp pain in sternum that started 2 days ago. Pt states that this happens when he is on the antibiotic therapy for a staph infection  EXAM: CT ANGIOGRAPHY CHEST WITH CONTRAST  TECHNIQUE: Multidetector CT imaging of the chest was performed using the standard protocol during bolus administration of intravenous contrast. Multiplanar CT image reconstructions and MIPs were obtained to evaluate the vascular anatomy.  CONTRAST:  OMNIPAQUE IOHEXOL 350 MG/ML SOLN  COMPARISON:  Current chest radiograph  FINDINGS: Angiographic study: No evidence of a pulmonary embolus. Thoracic aorta is normal in caliber. No evidence of dissection.  Thoracic inlet: Prominent shotty lymph nodes at the left neck base and in the left axilla. Largest neck base node measures 1 cm short axis. Largest axillary node measures 1 cm short axis. No neck base masses.  Mediastinum and hila: Several prominent to mildly enlarged lymph nodes. There is a left para carinal node measuring 12 mm in short axis and a right peritracheal, azygos level node measuring 12 mm short axis. There  is also increased soft tissue along the hila consistent with prominent ill-defined lymph nodes. Heart is normal in size minor coronary artery calcifications are noted. Trace pericardial.  Lungs and pleural: Diffuse, irregular thickening of the interstitial markings appear there are several small nodules. There is a 4.5 mm nodule in the right upper lobe, image 21, series 6, 3-4 mm  nodule in the right upper lobe anteriorly and medially adjacent to the minor fissure, image 53, similar slice pleural-based nodule in the anteromedial right middle lobe, image 62, 2 mm nodule in the left upper lobe, image 25, ill-defined 5 mm nodule in left upper lobe, image 40, 3 mm nodule in the left upper lobe lingula, image 58, 3-4 mm nodule in the left lower lobe, image 58, as well as multiple other tiny nodules. There is focal opacity adjacent to the right oblique fissure, image 50, measuring 8 mm in longest dimension, with a smaller similar area of opacity adjacent to this along an accessory fissure. There are no areas of lung consolidation to suggest pneumonia. No pulmonary edema. There is mild basilar subsegmental atelectasis. No pleural effusion or pneumothorax.  Limited upper abdomen: Low-density right upper pole renal lesion consistent with a cyst. Small stone in the upper pole of the left kidney.  Musculoskeletal: Minor degenerative spurring along the thoracic spine. No osteoblastic or osteolytic lesions.  Review of the MIP images confirms the above findings.  IMPRESSION: 1. No evidence of a pulmonary embolus. 2. Mild diffuse interstitial prominence in the lungs in multiple small pulmonary nodules. Findings may be chronic. Consider atypical pneumonia in the proper clinical setting. 3. No lung consolidation to suggest lobar pneumonia. No evidence of pulmonary edema. 4. With regard to the pulmonary nodules, the following is recommended: If the patient is at high risk for bronchogenic carcinoma, follow-up chest CT at 3-64months is recommended. If the patient is at low risk for bronchogenic carcinoma, follow-up chest CT at 6-12 months is recommended. This recommendation follows the consensus statement: Guidelines for Management of Small Pulmonary Nodules Detected on CT Scans: A Statement from the Fleischner Society as published in Radiology 2005; 237:395-400.   Electronically Signed   By: Amie Portland M.D.    On: 03/26/2015 15:59   Dg Chest Port 1 View  03/26/2015   CLINICAL DATA:  49 year old male with left side chest and upper quadrant pain with shortness of breath for 2 days. Initial encounter.  EXAM: PORTABLE CHEST - 1 VIEW  COMPARISON:  [facility] chest radiograph 04/24/2008.  FINDINGS: Portable AP upright view at 1217 hrs. Right side PICC line in place, catheter tip projects at the level of the carina. Partially visible cervical ACDF hardware. Lung volumes remain normal. Normal cardiac size and mediastinal contours. Visualized tracheal air column is within normal limits. No pneumothorax, pulmonary edema, pleural effusion or confluent pulmonary opacity.  IMPRESSION: No acute cardiopulmonary abnormality. Right PICC line catheter in place.   Electronically Signed   By: Odessa Fleming M.D.   On: 03/26/2015 12:46   Discharge Exam: Filed Vitals:   03/26/15 1901  BP: 161/108  Pulse: 101  Temp: 98.7 F (37.1 C)  Resp: 23   Filed Vitals:   03/26/15 1745 03/26/15 1800 03/26/15 1901 03/26/15 2016  BP: 150/107 158/102 161/108   Pulse: 106 102 101   Temp:   98.7 F (37.1 C)   TempSrc:   Oral   Resp: Height:     (1.778 m)  Weight:  81.149 kg (178 lb 14.4 oz)  SpO2: 92% 88% 88%     Physical exam not done as pt left AMA  Discharge Instructions     Medication List    ASK your doctor about these medications        aspirin-acetaminophen-caffeine 250-250-65 MG per tablet  Commonly known as:  EXCEDRIN MIGRAINE  Take 1 tablet by mouth every 6 (six) hours as needed for migraine.     cyclobenzaprine 10 MG tablet  Commonly known as:  FLEXERIL  Take 10 mg by mouth every 8 (eight) hours as needed. pain     ondansetron 4 MG tablet  Commonly known as:  ZOFRAN  Take 1 tablet (4 mg total) by mouth 3 (three) times daily as needed for nausea or vomiting.     oxyCODONE 15 MG immediate release tablet  Commonly known as:  ROXICODONE  Take 15 mg by mouth every 4 (four) hours as  needed. pain          The results of significant diagnostics from this hospitalization (including imaging, microbiology, ancillary and laboratory) are listed below for reference.     Microbiology: No results found for this or any previous visit (from the past 240 hour(s)).   Labs: Basic Metabolic Panel: No results for input(s): NA, K, CL, CO2, GLUCOSE, BUN, CREATININE, CALCIUM, MG, PHOS in the last 168 hours. Liver Function Tests: No results for input(s): AST, ALT, ALKPHOS, BILITOT, PROT, ALBUMIN in the last 168 hours. No results for input(s): LIPASE, AMYLASE in the last 168 hours. No results for input(s): AMMONIA in the last 168 hours. CBC: No results for input(s): WBC, NEUTROABS, HGB, HCT, MCV, PLT in the last 168 hours. Cardiac Enzymes: No results for input(s): CKTOTAL, CKMB, CKMBINDEX, TROPONINI in the last 168 hours. BNP: BNP (last 3 results) No results for input(s): BNP in the last 8760 hours.  ProBNP (last 3 results) No results for input(s): PROBNP in the last 8760 hours.  CBG: No results for input(s): GLUCAP in the last 168 hours.   SIGNED: Time coordinating discharge: Over 30 minutes  Debbora Presto, MD  [facility] 04/11/2015, 9:35 PM Pager [phone]  If 7PM-7AM, please contact night-coverage [email or web address] Password [email or web address]

## 2015-05-07 ENCOUNTER — Encounter: Payer: Self-pay | Admitting: Infectious Diseases

## 2015-10-14 ENCOUNTER — Other Ambulatory Visit: Payer: Self-pay | Admitting: Neurosurgery

## 2015-10-30 ENCOUNTER — Encounter (HOSPITAL_COMMUNITY): Payer: Self-pay | Admitting: *Deleted

## 2015-10-30 ENCOUNTER — Encounter (HOSPITAL_COMMUNITY): Payer: Self-pay | Admitting: Vascular Surgery

## 2015-10-30 NOTE — Progress Notes (Signed)
Spoke with pt for pre-op call. Pt is requesting medication the morning of surgery for anxiety. He states he is feeling anxious about the surgery and has a history of anxiety. I informed him the anesthesiologist would be talking with him the morning of surgery and would be the one to decide if that was appropriate. Pt voiced understanding.  Pt denies any chest pain or sob. He did see a cardiologist in July, 2016 when he was admitted for c/o chest pain, sob and tachycardia. Pt left AMA before getting an Echo done.

## 2015-10-31 ENCOUNTER — Telehealth: Payer: Self-pay | Admitting: Cardiovascular Disease

## 2015-10-31 MED ORDER — MUPIROCIN 2 % EX OINT: 1.0000 "application " | TOPICAL_OINTMENT | Freq: Once | CUTANEOUS | Status: DC

## 2015-10-31 MED ORDER — CEFAZOLIN SODIUM-DEXTROSE 2-3 GM-% IV SOLR: 2.0000 g | INTRAVENOUS | Status: AC

## 2015-10-31 NOTE — Telephone Encounter (Signed)
New Message  Pt requested to speak w/ RN as soon as possible concerning his upcoming surgery. Please call back and discuss.

## 2015-10-31 NOTE — Telephone Encounter (Signed)
Spoke with patient who is upset about cancellation of cervical spine surgery.  Patient is talking very quickly and asking questions but not waiting for an answer; he states he was told by Dr. Cassandria Santee office that his surgery is cancelled because Dr. Elease Hashimoto will not sign cardiac clearance and his reason for being in the hospital in July was a medication reaction.  I advised him that it is the decision of the anesthesiologist and the surgeon as to whether they will perform surgery with or without cardiac clearance.  I advised him that he needs to discuss with them and that Dr. Elease Hashimoto is happy to see him in the office if needed.  He thanked me for my help.

## 2015-10-31 NOTE — Progress Notes (Signed)
Anesthesia Brief Note: Patient is a 50 year old male scheduled for C6-7 ACDF on 11/01/15 by Dr. Jeral Fruit. He was scheduled as a SAME DAY WORK-UP.  He was hospitalized in 03/2015 with chest pain and elevated troponin (0.65), acute hypoxic respiratory failure in the setting of lumbar wound following L4-5 laminectomy 01/2015. He was on IV antibiotics and it was felt that he was having an adverse reaction; however echo was recommended to evaluate his EF. Also recommendation to rule out PE. If CTA was negative for PE then would also consider a stress Myoview. CTA did rule out PE, but patient then left AMA. He has not seen cardiology since. I have anesthesiologist Dr. Noreene Larsson read cardiologist Dr. Harvie Bridge hospital consult note and his discharge summary from 03/2015. Recommend that patient be re-evaluated by cardiology to determine what testing may be needed prior to proceeding with surgery. I notified Jessica at Dr. Cassandria Santee office of this yesterday. Darl Pikes followed up with Dr. Elease Hashimoto this afternoon. As expected, Dr. Elease Hashimoto would not clear patient for surgery without re-evaluation. Darl Pikes notified patient. She reports that he is very upset, and insisting that he is still planning to show up for surgery at "5AM" regardless of what the anesthesiologist and cardiologist have said. She told him that if he did show up, he would not have have his surgery. Based on his demeanor on the telephone, she advised notifying staff that if he arrives they may want to go ahead and alert Security (ext. [phone]). I have notified staff in Admitting and the OR director. I will also leave my note for our charge nurse to review when she arrives at [facility] tomorrow.  Velna Ochs [facility] Short Stay [facility]/Anesthesiology Phone [phone] 10/31/2015 5:31 PM

## 2015-11-01 ENCOUNTER — Ambulatory Visit (HOSPITAL_COMMUNITY): Admission: RE | Admit: 2015-11-01 | Payer: Medicaid Other | Source: Ambulatory Visit | Admitting: Neurosurgery

## 2015-11-01 ENCOUNTER — Ambulatory Visit (INDEPENDENT_AMBULATORY_CARE_PROVIDER_SITE_OTHER): Payer: Medicaid Other | Admitting: Cardiology

## 2015-11-01 ENCOUNTER — Telehealth: Payer: Self-pay | Admitting: Cardiovascular Disease

## 2015-11-01 ENCOUNTER — Encounter: Payer: Self-pay | Admitting: Cardiology

## 2015-11-01 VITALS — BP 150/104 | HR 79 | Ht 70.0 in | Wt 186.4 lb

## 2015-11-01 DIAGNOSIS — R778 Other specified abnormalities of plasma proteins: Secondary | ICD-10-CM

## 2015-11-01 DIAGNOSIS — I4510 Unspecified right bundle-branch block: Secondary | ICD-10-CM | POA: Diagnosis not present

## 2015-11-01 DIAGNOSIS — Z01818 Encounter for other preprocedural examination: Secondary | ICD-10-CM | POA: Diagnosis not present

## 2015-11-01 DIAGNOSIS — M47812 Spondylosis without myelopathy or radiculopathy, cervical region: Secondary | ICD-10-CM | POA: Insufficient documentation

## 2015-11-01 DIAGNOSIS — M5030 Other cervical disc degeneration, unspecified cervical region: Secondary | ICD-10-CM

## 2015-11-01 DIAGNOSIS — R0902 Hypoxemia: Secondary | ICD-10-CM | POA: Diagnosis not present

## 2015-11-01 DIAGNOSIS — R7989 Other specified abnormal findings of blood chemistry: Secondary | ICD-10-CM

## 2015-11-01 DIAGNOSIS — Z01810 Encounter for preprocedural cardiovascular examination: Secondary | ICD-10-CM | POA: Diagnosis not present

## 2015-11-01 DIAGNOSIS — R079 Chest pain, unspecified: Secondary | ICD-10-CM

## 2015-11-01 DIAGNOSIS — B182 Chronic viral hepatitis C: Secondary | ICD-10-CM

## 2015-11-01 HISTORY — DX: Anxiety disorder, unspecified: F41.9

## 2015-11-01 HISTORY — DX: Headache, unspecified: R51.9

## 2015-11-01 HISTORY — DX: Headache: R51

## 2015-11-01 SURGERY — ANTERIOR CERVICAL DECOMPRESSION/DISCECTOMY FUSION 1 LEVEL
Anesthesia: General

## 2015-11-01 NOTE — Assessment & Plan Note (Signed)
Pt seen today for pre clearance prior to proposed surgery by Dr Jeral Fruit.

## 2015-11-01 NOTE — Telephone Encounter (Signed)
Follow Up  Pt called request a call back to discuss how they can expedite the surgery. Pt states that he was unable to have the surgery because his cardiologist hadnt signed of on it. Pt states that he doesn't want anything concerning his cardiac issues on his chart. Please call back to discuss.

## 2015-11-01 NOTE — Assessment & Plan Note (Signed)
0.65pk during July 2016 admission

## 2015-11-01 NOTE — Progress Notes (Signed)
11/01/2015 Corey Gilbert   Feb 21, 1966  [id number]  Primary Physician Pcp Not In System Primary Cardiologist: Dr Elease Hashimoto  HPI:  50 y/o male with a history of prior back surgery, seen by Dr Elease Hashimoto in July 2016 as a consult in the hospital. The pt had had back surgery in May 2016 complicated by wound infection. He was on home IV antibiotics and came to the ED with progressive weakness July 2016. In the ED he was noted to be hypoxic. There is mention of chest pain but the pt denies this. A Troponin was obtained and an EKG. His EKG showed RBBB and his Troponin was elevated at 0. 65. Initially Dr Elease Hashimoto was concerned the pt had a PE and CTA was performed. This was remarkable for pulmonary nodules but not PE. Before further work up could be done the pt left AMA.           He is seen today as an add on in [facility]. He apparently now need C-spine surgery and was seen by anesthesiology who recommended cardiac clearance first. The pt denies any chest pain He is a Counselling psychologist and very active. He does not smoke, no FMHx of CAD, no history of HTN, DM, or HLD.    Current Outpatient Prescriptions  Medication Sig Dispense Refill  . aspirin-acetaminophen-caffeine (EXCEDRIN MIGRAINE) 250-250-65 MG per tablet Take 1 tablet by mouth every 6 (six) hours as needed for migraine.     . cyclobenzaprine (FLEXERIL) 10 MG tablet Take 10 mg by mouth every 8 (eight) hours as needed. pain  0  . Oxycodone HCl 10 MG TABS Take 10 mg by mouth 4 (four) times daily as needed (pain).     No current facility-administered medications for this visit.   Facility-Administered Medications Ordered in Other Visits  Medication Dose Route Frequency Provider Last Rate Last Dose  . ceFAZolin (ANCEF) IVPB 2 g/50 mL premix  2 g Intravenous To SS-Surg Hilda Lias, MD      . mupirocin ointment (BACTROBAN) 2 % 1 application  1 application Topical Once Hilda Lias, MD        Allergies  Allergen Reactions  . Toradol  [Ketorolac Tromethamine] Other (See Comments)    Reacts with other medications; also, fever and mania  . Other Other (See Comments)    Antidepressants   . Acetaminophen Other (See Comments)    Avoids due to history of Hepatitis C  . Baclofen Other (See Comments)    Goes manic  . Gabapentin Other (See Comments)    Goes manic  . Ibuprofen Other (See Comments)    Goes manic  . Lyrica [Pregabalin] Other (See Comments)    Mania    Social History   Social History  . Marital Status: Married    Spouse Name: N/A  . Number of Children: N/A  . Years of Education: N/A   Occupational History  . Unemployed [place]    Social History Main Topics  . Smoking status: Never Smoker   . Smokeless tobacco: Never Used  . Alcohol Use: No     Comment: rarely  . Drug Use: Yes    Special: Marijuana, Oxycodone     Comment: uses marijuana to help bipolar  . Sexual Activity: Yes   Other Topics Concern  . Not on file   Social History Narrative   Lives in wife and son in [place].      Review of Systems: General: negative for chills, fever, night sweats  or weight changes.  Cardiovascular: negative for chest pain, dyspnea on exertion, edema, orthopnea, palpitations, paroxysmal nocturnal dyspnea or shortness of breath Dermatological: negative for rash Respiratory: negative for cough or wheezing Urologic: negative for hematuria Abdominal: negative for nausea, vomiting, diarrhea, bright red blood per rectum, melena, or hematemesis Neurologic: negative for visual changes, syncope, or dizziness All other systems reviewed and are otherwise negative except as noted above.    Blood pressure 150/104, pulse 79, height  (1.778 m), weight 186 lb 6.4 oz (84.55 kg).  General appearance: alert, cooperative and no distress Neck: no carotid bruit and no JVD Lungs: clear to auscultation bilaterally Heart: regular rate and rhythm Abdomen: soft, non-tender; bowel sounds normal; no masses,   no organomegaly Extremities: extremities normal, atraumatic, no cyanosis or edema Pulses: 2+ and symmetric Skin: Skin color, texture, turgor normal. No rashes or lesions or multiple tattoos Neurologic: Grossly normal  EKG NSR RBBB  ASSESSMENT AND PLAN:   Pre-operative cardiovascular examination Pt here for pre op clearance prior to C-spine surgery next week with Dr Jeral Fruit  Elevated troponin 0.65pk during July 2016 admission  Hypoxia Transient hypoxia when hospitalized in July, CTA negative for PE  Bundle branch block, right Chronic RBBB  Chronic hepatitis C (HCC) Treated with Harvoni  DJD (degenerative joint disease), cervical Pt seen today for pre clearance prior to proposed surgery by Dr Jeral Fruit.    PLAN  Pt seen by Dr Elease Hashimoto and myself today. We will arrange an exercise Myoview for Monday, if no significant abnormality he is cleared for surgery without further work up.  Cardiology F/U pending Myoview result.   Corine Shelter K PA-C 11/01/2015 11:41 AM

## 2015-11-01 NOTE — Telephone Encounter (Signed)
New Message  Pt calling to speak w/ RN- to hopefully sched an appt for today 2/10 after appt w/ [facility]. Pt requested to speak w/ RN-  Please call back and discuss.

## 2015-11-01 NOTE — Assessment & Plan Note (Signed)
Pt here for pre op clearance prior to C-spine surgery next week with Dr Jeral Fruit

## 2015-11-01 NOTE — Telephone Encounter (Signed)
Spoke with patient who states he can be rescheduled for cervical spine surgery next week if he can be seen by cardiology today or Monday.  He continues to argue about need for cardiology visit and I advised that surgeon and anesthesiologist require clearance due to hospitalization for elevated troponin.  I advised that Dr. Harvie Bridge schedule is full.  Dr. Elease Hashimoto talked with [facility] Corine Shelter, PA who is able to see the patient today.  Dr. Elease Hashimoto advised that a nuclear exercise stress will need to be done prior to surgery.  I spoke with Doyne Keel in nuclear medicine and she advised patient can have stress test Monday 2/13 at 8:00.  I scheduled patient for 11:00 today with  Franky Macho and advised him of need for stress test on Monday.  He verbalized understanding and agreement.

## 2015-11-01 NOTE — Patient Instructions (Signed)
Medication Instructions:  Your physician recommends that you continue on your current medications as directed. Please refer to the Current Medication list given to you today.   Labwork: NONE ORDERED  Testing/Procedures: Your physician has requested that you have en exercise stress myoview. For further information please visit [email or web address] Please follow instruction sheet, as given.   Follow-Up: .WILL BE BASED UPON THE STRESS TEST  Any Other Special Instructions Will Be Listed Below (If Applicable).     If you need a refill on your cardiac medications before your next appointment, please call your pharmacy.

## 2015-11-01 NOTE — Assessment & Plan Note (Signed)
Treated with Harvoni.

## 2015-11-01 NOTE — Assessment & Plan Note (Signed)
Chronic RBBB

## 2015-11-01 NOTE — Assessment & Plan Note (Signed)
Transient hypoxia when hospitalized in July, CTA negative for PE

## 2015-11-04 ENCOUNTER — Ambulatory Visit (HOSPITAL_COMMUNITY): Payer: Medicaid Other | Attending: Internal Medicine

## 2015-11-04 ENCOUNTER — Other Ambulatory Visit: Payer: Self-pay | Admitting: Neurosurgery

## 2015-11-04 DIAGNOSIS — R9439 Abnormal result of other cardiovascular function study: Secondary | ICD-10-CM | POA: Insufficient documentation

## 2015-11-04 DIAGNOSIS — I4510 Unspecified right bundle-branch block: Secondary | ICD-10-CM | POA: Diagnosis not present

## 2015-11-04 DIAGNOSIS — R079 Chest pain, unspecified: Secondary | ICD-10-CM | POA: Insufficient documentation

## 2015-11-04 LAB — MYOCARDIAL PERFUSION IMAGING
CHL CUP NUCLEAR SDS: 0
CHL CUP NUCLEAR SRS: 0
CHL CUP RESTING HR STRESS: 68 {beats}/min
LV sys vol: 59 mL
LVDIAVOL: 130 mL
Peak HR: 99 {beats}/min
RATE: 0.26
SSS: 0
TID: 1.06

## 2015-11-04 MED ORDER — TECHNETIUM TC 99M SESTAMIBI GENERIC - CARDIOLITE
32.2000 | Freq: Once | INTRAVENOUS | Status: AC | PRN
  Administered 2015-11-04: 32.2 via INTRAVENOUS

## 2015-11-04 MED ORDER — REGADENOSON 0.4 MG/5ML IV SOLN
0.4000 mg | Freq: Once | INTRAVENOUS | Status: AC
  Administered 2015-11-04: 0.4 mg via INTRAVENOUS

## 2015-11-04 MED ORDER — TECHNETIUM TC 99M SESTAMIBI GENERIC - CARDIOLITE
10.7000 | Freq: Once | INTRAVENOUS | Status: AC | PRN
  Administered 2015-11-04: 11 via INTRAVENOUS

## 2015-11-07 NOTE — Progress Notes (Addendum)
Anesthesia Chart Review: Patient is a 50 year old male scheduled for C6-7 ACDF on 11/13/15 by Dr. Jeral Fruit. Procedure was initially scheduled for 11/01/15, but was canceled due to need for cardiology pre-operative evaluation. (He was hospitalized in 03/2015 with chest pain and elevated troponin (0.65), acute hypoxic respiratory failure in the setting of lumbar wound following L4-5 laminectomy 01/2015. He was on IV antibiotics and it was felt that he was having an adverse reaction; however echo was recommended to evaluate his EF. Also recommendation to rule out PE. If CTA was negative for PE then would also consider a stress Myoview. CTA did rule out PE, but patient then left AMA and did not follow-up with cardiology.) Since then, patient was seen by Corine Shelter, PA-C with Dr. Elease Hashimoto and ultimately cleared following a non-ischemic stress test (see below).  Other history includes non-smoker, post-operative N/V, concussion, near syncope '13, Bipolar disorder, depression, anxiety, nephrolithiasis, hepatitis C s/p treatment with Harvoni, ACDF '14, lumbar laminectomy '13. No PCP is listed.  11/01/15 EKG: NSR, right BBB, septal infarct (age undetermined).  11/04/15 Nuclear stress test:  Nuclear stress EF: 54%.  There was no ST segment deviation noted during stress.  Small in size, mild in intensity, fixed defect involving the mid and basal inferior wall. In setting of normal LVF, this is most consistent with diaphragmatic attenuation. No ischemia is noted  This is a low risk study.  The left ventricular ejection fraction is mildly decreased (45-54%).  03/26/15 1V CXR: IMPRESSION: No acute cardiopulmonary abnormality. Right PICC line catheter in place.  03/26/15 Chest CTA: IMPRESSION: 1. No evidence of a pulmonary embolus. 2. Mild diffuse interstitial prominence in the lungs in multiple small pulmonary nodules. Findings may be chronic. Consider atypical pneumonia in the proper clinical setting. 3. No lung  consolidation to suggest lobar pneumonia. No evidence of pulmonary edema. 4. With regard to the pulmonary nodules, the following is recommended: If the patient is at high risk for bronchogenic carcinoma, follow-up chest CT at 3-54months is recommended. If the patient is at low risk for bronchogenic carcinoma, follow-up chest CT at 6-12 months is recommended. This recommendation follows the consensus statement: Guidelines for Management of Small Pulmonary Nodules Detected on CT Scans: A Statement from the Fleischner Society as published in Radiology 2005; 237:395-400.  PAT visit has not been scheduled yet. (Update: Per our scheduler, patient contacted 11/07/15 to schedule a PAT visit, but patient would not schedule as he said the office told him that he could be a same day work-up.)  Velna Ochs [facility]/Anesthesiology Phone [phone] 11/07/2015 3:30 PM

## 2015-11-11 NOTE — Progress Notes (Signed)
Called pt and gave him pre-op instructions for his surgery on Wednesday. Had spoken with pt about a week and half ago for pre-op, surgery was rescheduled until he could get cardiac clearance. He has seen Corine Shelter, [place] and has had a stress test and cardiac clearance is noted in EPIC.  Stress - 11/04/15 - in EPIC EKG - 11/01/15 - in EPIC.

## 2015-11-12 MED ORDER — CEFAZOLIN SODIUM-DEXTROSE 2-3 GM-% IV SOLR
2.0000 g | INTRAVENOUS | Status: AC
  Administered 2015-11-13: 2 g via INTRAVENOUS
  Filled 2015-11-12: qty 50

## 2015-11-12 NOTE — H&P (Addendum)
Corey Gilbert is an 50 y.o. male.   Chief Complaint: neck pain HPI: patient who had ACDF from c4 to c6, did well but lately he has been complaining of neck pain with radiation to both shlulders getting relief only when he keeps his neck in a collar  Past Medical History  Diagnosis Date  . Mental disorder     Bi- Polar  . Near syncope     due to opain, last time  early Sept 2013  . Depression   . Concussion   . Kidney stones   . Arthritis   . Family history of anesthesia complication     pon& v  . Bipolar disorder (HCC)   . Anxiety   . Headache   . Hepatitis     Hepatitis C - has been treated with Harvoni  . Complication of anesthesia   . PONV (postoperative nausea and vomiting)     not on most recent surgery    Past Surgical History  Procedure Laterality Date  . Wrist surgery  2013    [facility] at [facility]  . Wrist surgery  2000    right  . Lumbar laminectomy/decompression microdiscectomy  06/14/2012    Procedure: LUMBAR LAMINECTOMY/DECOMPRESSION MICRODISCECTOMY 1 LEVEL;  Surgeon: Karn Cassis, MD;  Location: [facility];  Service: Neurosurgery;  Laterality: Left;  Left Lumbar Removal of Fragment/Laminectomy Lumbar Three-Four  . Back surgery  2013    at [facility]  . Sp arthro thumb*l*  2013  left  2000 right    bilateral tendon  release.   Marland Kitchen Anterior cervical decomp/discectomy fusion  09/27/2012    Procedure: ANTERIOR CERVICAL DECOMPRESSION/DISCECTOMY FUSION 2 LEVELS;  Surgeon: Karn Cassis, MD;  Location: [facility];  Service: Neurosurgery;  Laterality: N/A;  Cervical four-five, Cervical five-six,  Anterior cervical decompression/diskectomy/fusion/plate  . Lumbar wound debridement N/A 02/16/2015    Procedure: LUMBAR WOUND DEBRIDEMENT;  Surgeon: Coletta Memos, MD;  Location: [facility];  Service: Neurosurgery;  Laterality: N/A;  . Lithotripsy      Family History  Problem Relation Age of Onset  . Arrhythmia Mother    Social History:  reports that he has never smoked. He has  never used smokeless tobacco. He reports that he uses illicit drugs (Marijuana and Oxycodone). He reports that he does not drink alcohol.  Allergies:  Allergies  Allergen Reactions  . Acetaminophen Other (See Comments)    Avoids due to history of Hepatitis C  . Baclofen Other (See Comments)    Goes manic  . Gabapentin Other (See Comments)    Goes manic  . Ibuprofen Other (See Comments)    Goes manic  . Lyrica [Pregabalin] Other (See Comments)    Mania  . Toradol [Ketorolac Tromethamine] Other (See Comments)    Reacts with other medications; also, fever and mania  . Other Other (See Comments)    Antidepressants     No prescriptions prior to admission    No results found for this or any previous visit (from the past 48 hour(s)). No results found.  Review of Systems  Constitutional: Negative.   Eyes: Negative.   Respiratory: Negative.   Cardiovascular: Negative.   Gastrointestinal: Negative.   Musculoskeletal: Positive for back pain and neck pain.  Skin: Negative.   Neurological: Positive for focal weakness.  Endo/Heme/Allergies: Negative.   Psychiatric/Behavioral: Negative.     There were no vitals taken for this visit. Physical Exam hent, nl. Neck, pain with mobility. Anterior scar predent. Cv, nl. Lungs,  clear , abdomen, nl. Extremities, nl. NEURO no evidence of weakness. Pain with mobility. Myelo shows spondylolisthesis at c6-7with foramial narrowing Assessment/Plan decopression and fusion at c6-7 He and his wife are aware of risks and benefits  Karn Cassis, MD 11/12/2015, 5:23 PM

## 2015-11-13 ENCOUNTER — Ambulatory Visit (HOSPITAL_COMMUNITY): Payer: Medicaid Other

## 2015-11-13 ENCOUNTER — Ambulatory Visit (HOSPITAL_COMMUNITY): Payer: Medicaid Other | Admitting: Vascular Surgery

## 2015-11-13 ENCOUNTER — Observation Stay (HOSPITAL_COMMUNITY)
Admission: RE | Admit: 2015-11-13 | Discharge: 2015-11-14 | Disposition: A | Discharge status: Home / Self Care | Payer: Medicaid Other | Source: Ambulatory Visit | Attending: Neurosurgery | Admitting: Neurosurgery

## 2015-11-13 ENCOUNTER — Encounter (HOSPITAL_COMMUNITY): Payer: Self-pay | Admitting: Surgery

## 2015-11-13 ENCOUNTER — Encounter (HOSPITAL_COMMUNITY)
Admission: RE | Disposition: A | Discharge status: Home / Self Care | Payer: Self-pay | Source: Ambulatory Visit | Attending: Neurosurgery

## 2015-11-13 DIAGNOSIS — M542 Cervicalgia: Secondary | ICD-10-CM

## 2015-11-13 DIAGNOSIS — I10 Essential (primary) hypertension: Secondary | ICD-10-CM | POA: Diagnosis not present

## 2015-11-13 DIAGNOSIS — F1210 Cannabis abuse, uncomplicated: Secondary | ICD-10-CM | POA: Diagnosis present

## 2015-11-13 DIAGNOSIS — Z981 Arthrodesis status: Secondary | ICD-10-CM | POA: Insufficient documentation

## 2015-11-13 DIAGNOSIS — I973 Postprocedural hypertension: Secondary | ICD-10-CM | POA: Diagnosis not present

## 2015-11-13 DIAGNOSIS — D696 Thrombocytopenia, unspecified: Secondary | ICD-10-CM | POA: Insufficient documentation

## 2015-11-13 DIAGNOSIS — M4802 Spinal stenosis, cervical region: Secondary | ICD-10-CM | POA: Diagnosis not present

## 2015-11-13 DIAGNOSIS — M50123 Cervical disc disorder at C6-C7 level with radiculopathy: Principal | ICD-10-CM | POA: Insufficient documentation

## 2015-11-13 DIAGNOSIS — R079 Chest pain, unspecified: Secondary | ICD-10-CM | POA: Insufficient documentation

## 2015-11-13 DIAGNOSIS — F319 Bipolar disorder, unspecified: Secondary | ICD-10-CM | POA: Diagnosis not present

## 2015-11-13 DIAGNOSIS — B182 Chronic viral hepatitis C: Secondary | ICD-10-CM | POA: Insufficient documentation

## 2015-11-13 HISTORY — PX: ANTERIOR CERVICAL DECOMP/DISCECTOMY FUSION: SHX1161

## 2015-11-13 LAB — SURGICAL PCR SCREEN
MRSA, PCR: NEGATIVE
STAPHYLOCOCCUS AUREUS: NEGATIVE

## 2015-11-13 LAB — COMPREHENSIVE METABOLIC PANEL
ALBUMIN: 4.2 g/dL (ref 3.5–5.0)
ALK PHOS: 64 U/L (ref 38–126)
ALT: 17 U/L (ref 17–63)
ANION GAP: 13 (ref 5–15)
AST: 24 U/L (ref 15–41)
BUN: 9 mg/dL (ref 6–20)
CHLORIDE: 101 mmol/L (ref 101–111)
CO2: 27 mmol/L (ref 22–32)
Calcium: 10 mg/dL (ref 8.9–10.3)
Creatinine, Ser: 1.05 mg/dL (ref 0.61–1.24)
GFR calc Af Amer: >60 mL/min (ref >60)
GFR calc non Af Amer: >60 mL/min (ref >60)
GLUCOSE: 105 mg/dL — AB (ref 65–99)
POTASSIUM: 4.3 mmol/L (ref 3.5–5.1)
SODIUM: 141 mmol/L (ref 135–145)
Total Bilirubin: 0.4 mg/dL (ref 0.3–1.2)
Total Protein: 7.3 g/dL (ref 6.5–8.1)

## 2015-11-13 LAB — CBC
HCT: 45.7 % (ref 39.0–52.0)
HEMOGLOBIN: 16.2 g/dL (ref 13.0–17.0)
MCH: 30.5 pg (ref 26.0–34.0)
MCHC: 35.4 g/dL (ref 30.0–36.0)
MCV: 86.1 fL (ref 78.0–100.0)
Platelets: 134 10*3/uL — ABNORMAL LOW (ref 150–400)
RBC: 5.31 MIL/uL (ref 4.22–5.81)
RDW: 12.8 % (ref 11.5–15.5)
WBC: 8.5 10*3/uL (ref 4.0–10.5)

## 2015-11-13 SURGERY — ANTERIOR CERVICAL DECOMPRESSION/DISCECTOMY FUSION 1 LEVEL
Anesthesia: General | Site: Neck

## 2015-11-13 MED ORDER — MORPHINE SULFATE 2 MG/ML IV SOLN
INTRAVENOUS | Status: DC
  Filled 2015-11-13 (×2): qty 25

## 2015-11-13 MED ORDER — ONDANSETRON HCL 4 MG/2ML IJ SOLN
INTRAMUSCULAR | Status: DC | PRN
Start: 2015-11-13 — End: 2015-11-13
  Administered 2015-11-13: 4 mg via INTRAVENOUS

## 2015-11-13 MED ORDER — SODIUM CHLORIDE 0.9% FLUSH: 3.0000 mL | INTRAVENOUS | Status: DC | PRN

## 2015-11-13 MED ORDER — MIDAZOLAM HCL 2 MG/2ML IJ SOLN
INTRAMUSCULAR | Status: AC
  Filled 2015-11-13: qty 2

## 2015-11-13 MED ORDER — THROMBIN 5000 UNITS EX SOLR
CUTANEOUS | Status: DC | PRN
  Administered 2015-11-13 (×2): 5000 [IU] via TOPICAL

## 2015-11-13 MED ORDER — THROMBIN 5000 UNITS EX SOLR
OROMUCOSAL | Status: DC | PRN
  Administered 2015-11-13: 10 mL via TOPICAL

## 2015-11-13 MED ORDER — DEXAMETHASONE SODIUM PHOSPHATE 10 MG/ML IJ SOLN
INTRAMUSCULAR | Status: AC
  Filled 2015-11-13: qty 2

## 2015-11-13 MED ORDER — MORPHINE SULFATE (PF) 2 MG/ML IV SOLN
1.0000 mg | INTRAVENOUS | Status: DC | PRN
  Administered 2015-11-13: 2 mg via INTRAVENOUS
  Administered 2015-11-13: 4 mg via INTRAVENOUS
  Administered 2015-11-14 (×2): 2 mg via INTRAVENOUS
  Filled 2015-11-13 (×2): qty 2
  Filled 2015-11-13 (×3): qty 1

## 2015-11-13 MED ORDER — NALOXONE HCL 0.4 MG/ML IJ SOLN: 0.4000 mg | INTRAMUSCULAR | Status: DC | PRN

## 2015-11-13 MED ORDER — HYDROMORPHONE HCL 1 MG/ML IJ SOLN
INTRAMUSCULAR | Status: AC
  Administered 2015-11-13: 1 mg via INTRAVENOUS
  Filled 2015-11-13: qty 1

## 2015-11-13 MED ORDER — DIPHENHYDRAMINE HCL 50 MG/ML IJ SOLN: 12.5000 mg | Freq: Four times a day (QID) | INTRAMUSCULAR | Status: DC | PRN

## 2015-11-13 MED ORDER — FENTANYL CITRATE (PF) 100 MCG/2ML IJ SOLN
INTRAMUSCULAR | Status: DC | PRN
  Administered 2015-11-13 (×2): 100 ug via INTRAVENOUS
  Administered 2015-11-13: 50 ug via INTRAVENOUS
  Administered 2015-11-13 (×2): 100 ug via INTRAVENOUS
  Administered 2015-11-13: 50 ug via INTRAVENOUS
  Administered 2015-11-13 (×2): 100 ug via INTRAVENOUS
  Administered 2015-11-13: 50 ug via INTRAVENOUS

## 2015-11-13 MED ORDER — FENTANYL CITRATE (PF) 250 MCG/5ML IJ SOLN
INTRAMUSCULAR | Status: AC
  Filled 2015-11-13: qty 5

## 2015-11-13 MED ORDER — HYDRALAZINE HCL 20 MG/ML IJ SOLN
10.0000 mg | Freq: Four times a day (QID) | INTRAMUSCULAR | Status: DC | PRN
  Administered 2015-11-14: 10 mg via INTRAVENOUS
  Filled 2015-11-13: qty 1

## 2015-11-13 MED ORDER — 0.9 % SODIUM CHLORIDE (POUR BTL) OPTIME
TOPICAL | Status: DC | PRN
  Administered 2015-11-13: 1000 mL

## 2015-11-13 MED ORDER — PROMETHAZINE HCL 25 MG/ML IJ SOLN: 6.2500 mg | INTRAMUSCULAR | Status: DC | PRN

## 2015-11-13 MED ORDER — LIDOCAINE HCL (CARDIAC) 20 MG/ML IV SOLN
INTRAVENOUS | Status: AC
  Filled 2015-11-13: qty 5

## 2015-11-13 MED ORDER — DEXMEDETOMIDINE HCL 200 MCG/2ML IV SOLN
INTRAVENOUS | Status: DC | PRN
  Administered 2015-11-13: 8 ug via INTRAVENOUS

## 2015-11-13 MED ORDER — SODIUM CHLORIDE 0.9% FLUSH: 3.0000 mL | Freq: Two times a day (BID) | INTRAVENOUS | Status: DC

## 2015-11-13 MED ORDER — LABETALOL HCL 5 MG/ML IV SOLN
5.0000 mg | Freq: Once | INTRAVENOUS | Status: AC
  Administered 2015-11-13: 5 mg via INTRAVENOUS

## 2015-11-13 MED ORDER — PROPOFOL 10 MG/ML IV BOLUS
INTRAVENOUS | Status: DC | PRN
  Administered 2015-11-13: 200 mg via INTRAVENOUS

## 2015-11-13 MED ORDER — ACETAMINOPHEN 325 MG PO TABS: 650.0000 mg | ORAL_TABLET | ORAL | Status: DC | PRN

## 2015-11-13 MED ORDER — HYDROMORPHONE HCL 1 MG/ML IJ SOLN
1.0000 mg | Freq: Once | INTRAMUSCULAR | Status: AC
  Administered 2015-11-13: 1 mg via INTRAVENOUS

## 2015-11-13 MED ORDER — ACETAMINOPHEN 650 MG RE SUPP
650.0000 mg | RECTAL | Status: DC | PRN
Start: 2015-11-13 — End: 2015-11-14

## 2015-11-13 MED ORDER — PROPOFOL 10 MG/ML IV BOLUS
INTRAVENOUS | Status: AC
  Filled 2015-11-13: qty 20

## 2015-11-13 MED ORDER — LORAZEPAM 2 MG/ML IJ SOLN
2.0000 mg | Freq: Once | INTRAMUSCULAR | Status: AC
Start: 2015-11-13 — End: 2015-11-13
  Administered 2015-11-13: 2 mg via INTRAVENOUS

## 2015-11-13 MED ORDER — LORAZEPAM 2 MG/ML IJ SOLN: 2.0000 mg | Freq: Once | INTRAMUSCULAR | Status: DC

## 2015-11-13 MED ORDER — KETAMINE HCL 100 MG/ML IJ SOLN
INTRAMUSCULAR | Status: AC
  Filled 2015-11-13: qty 1

## 2015-11-13 MED ORDER — MENTHOL 3 MG MT LOZG: 1.0000 | LOZENGE | OROMUCOSAL | Status: DC | PRN

## 2015-11-13 MED ORDER — LIDOCAINE HCL (CARDIAC) 20 MG/ML IV SOLN
INTRAVENOUS | Status: DC | PRN
  Administered 2015-11-13: 70 mg via INTRAVENOUS

## 2015-11-13 MED ORDER — HYDROMORPHONE HCL 1 MG/ML IJ SOLN
0.2500 mg | INTRAMUSCULAR | Status: DC | PRN
  Administered 2015-11-13 (×4): 0.5 mg via INTRAVENOUS

## 2015-11-13 MED ORDER — DEXMEDETOMIDINE HCL IN NACL 200 MCG/50ML IV SOLN
INTRAVENOUS | Status: AC
  Filled 2015-11-13: qty 100

## 2015-11-13 MED ORDER — HYDRALAZINE HCL 20 MG/ML IJ SOLN
INTRAMUSCULAR | Status: DC | PRN
  Administered 2015-11-13: 10 mg via INTRAVENOUS
  Administered 2015-11-13: 2 mg via INTRAVENOUS
  Administered 2015-11-13: 4 mg via INTRAVENOUS

## 2015-11-13 MED ORDER — ZOLPIDEM TARTRATE 5 MG PO TABS
5.0000 mg | ORAL_TABLET | Freq: Every evening | ORAL | Status: DC | PRN
  Administered 2015-11-13: 5 mg via ORAL
  Filled 2015-11-13 (×2): qty 1

## 2015-11-13 MED ORDER — HYDRALAZINE HCL 20 MG/ML IJ SOLN
INTRAMUSCULAR | Status: AC
  Filled 2015-11-13: qty 1

## 2015-11-13 MED ORDER — OXYCODONE HCL 5 MG PO TABS
ORAL_TABLET | ORAL | Status: AC
  Filled 2015-11-13: qty 1

## 2015-11-13 MED ORDER — HYDROMORPHONE HCL 1 MG/ML IJ SOLN
INTRAMUSCULAR | Status: AC
  Filled 2015-11-13: qty 1

## 2015-11-13 MED ORDER — SODIUM CHLORIDE 0.9 % IV SOLN
250.0000 mL | INTRAVENOUS | Status: DC
Start: 2015-11-13 — End: 2015-11-14

## 2015-11-13 MED ORDER — DIAZEPAM 5 MG PO TABS
5.0000 mg | ORAL_TABLET | Freq: Four times a day (QID) | ORAL | Status: DC | PRN
Start: 2015-11-13 — End: 2015-11-14
  Administered 2015-11-13 (×2): 5 mg via ORAL
  Filled 2015-11-13 (×2): qty 1

## 2015-11-13 MED ORDER — ONDANSETRON HCL 4 MG/2ML IJ SOLN: 4.0000 mg | Freq: Four times a day (QID) | INTRAMUSCULAR | Status: DC | PRN

## 2015-11-13 MED ORDER — DEXAMETHASONE SODIUM PHOSPHATE 10 MG/ML IJ SOLN
INTRAMUSCULAR | Status: DC | PRN
  Administered 2015-11-13: 10 mg via INTRAVENOUS

## 2015-11-13 MED ORDER — ROCURONIUM BROMIDE 50 MG/5ML IV SOLN
INTRAVENOUS | Status: AC
  Filled 2015-11-13: qty 1

## 2015-11-13 MED ORDER — EPHEDRINE SULFATE 50 MG/ML IJ SOLN
INTRAMUSCULAR | Status: AC
  Filled 2015-11-13: qty 1

## 2015-11-13 MED ORDER — SODIUM CHLORIDE 0.9 % IV SOLN: INTRAVENOUS | Status: DC

## 2015-11-13 MED ORDER — SODIUM CHLORIDE 0.9% FLUSH: 9.0000 mL | INTRAVENOUS | Status: DC | PRN

## 2015-11-13 MED ORDER — MUPIROCIN 2 % EX OINT
1.0000 "application " | TOPICAL_OINTMENT | Freq: Once | CUTANEOUS | Status: AC
  Administered 2015-11-13: 1 via TOPICAL

## 2015-11-13 MED ORDER — PHENOL 1.4 % MT LIQD: 1.0000 | OROMUCOSAL | Status: DC | PRN

## 2015-11-13 MED ORDER — HYDROMORPHONE HCL 1 MG/ML IJ SOLN
1.0000 mg | Freq: Once | INTRAMUSCULAR | Status: AC
Start: 2015-11-13 — End: 2015-11-13
  Administered 2015-11-13: 1 mg via INTRAVENOUS

## 2015-11-13 MED ORDER — DIAZEPAM 5 MG PO TABS: 10.0000 mg | ORAL_TABLET | Freq: Four times a day (QID) | ORAL | Status: DC | PRN

## 2015-11-13 MED ORDER — LORAZEPAM 2 MG/ML IJ SOLN
INTRAMUSCULAR | Status: AC
  Filled 2015-11-13: qty 1

## 2015-11-13 MED ORDER — ONDANSETRON HCL 4 MG/2ML IJ SOLN
4.0000 mg | INTRAMUSCULAR | Status: DC | PRN
  Filled 2015-11-13: qty 2

## 2015-11-13 MED ORDER — DIAZEPAM 5 MG PO TABS
ORAL_TABLET | ORAL | Status: AC
  Filled 2015-11-13: qty 1

## 2015-11-13 MED ORDER — OXYCODONE HCL 5 MG PO TABS
10.0000 mg | ORAL_TABLET | ORAL | Status: DC | PRN
  Administered 2015-11-13 – 2015-11-14 (×5): 10 mg via ORAL
  Filled 2015-11-13 (×4): qty 2

## 2015-11-13 MED ORDER — DOCUSATE SODIUM 100 MG PO CAPS
100.0000 mg | ORAL_CAPSULE | Freq: Two times a day (BID) | ORAL | Status: DC
  Administered 2015-11-13 – 2015-11-14 (×2): 100 mg via ORAL
  Filled 2015-11-13 (×2): qty 1

## 2015-11-13 MED ORDER — MUPIROCIN 2 % EX OINT
TOPICAL_OINTMENT | CUTANEOUS | Status: AC
  Administered 2015-11-13: 1 via TOPICAL
  Filled 2015-11-13: qty 22

## 2015-11-13 MED ORDER — SUGAMMADEX SODIUM 200 MG/2ML IV SOLN
INTRAVENOUS | Status: DC | PRN
  Administered 2015-11-13: 170 mg via INTRAVENOUS

## 2015-11-13 MED ORDER — DIPHENHYDRAMINE HCL 12.5 MG/5ML PO ELIX: 12.5000 mg | ORAL_SOLUTION | Freq: Four times a day (QID) | ORAL | Status: DC | PRN

## 2015-11-13 MED ORDER — LABETALOL HCL 5 MG/ML IV SOLN
INTRAVENOUS | Status: AC
  Administered 2015-11-13: 5 mg via INTRAVENOUS
  Filled 2015-11-13: qty 4

## 2015-11-13 MED ORDER — LABETALOL HCL 5 MG/ML IV SOLN
INTRAVENOUS | Status: DC | PRN
  Administered 2015-11-13 (×4): 10 mg via INTRAVENOUS

## 2015-11-13 MED ORDER — LACTATED RINGERS IV SOLN
INTRAVENOUS | Status: DC
  Administered 2015-11-13 (×3): via INTRAVENOUS

## 2015-11-13 MED ORDER — HEMOSTATIC AGENTS (NO CHARGE) OPTIME
TOPICAL | Status: DC | PRN
  Administered 2015-11-13: 1 via TOPICAL

## 2015-11-13 MED ORDER — OXYCODONE HCL 5 MG PO TABS
ORAL_TABLET | ORAL | Status: AC
  Filled 2015-11-13: qty 2

## 2015-11-13 MED ORDER — CEFAZOLIN SODIUM 1-5 GM-% IV SOLN
1.0000 g | Freq: Three times a day (TID) | INTRAVENOUS | Status: AC
  Administered 2015-11-14: 1 g via INTRAVENOUS
  Filled 2015-11-13 (×3): qty 50

## 2015-11-13 MED ORDER — ROCURONIUM BROMIDE 100 MG/10ML IV SOLN
INTRAVENOUS | Status: DC | PRN
  Administered 2015-11-13: 40 mg via INTRAVENOUS
  Administered 2015-11-13 (×3): 10 mg via INTRAVENOUS
  Administered 2015-11-13: 20 mg via INTRAVENOUS

## 2015-11-13 MED ORDER — MEPERIDINE HCL 25 MG/ML IJ SOLN: 6.2500 mg | INTRAMUSCULAR | Status: DC | PRN

## 2015-11-13 MED ORDER — ONDANSETRON HCL 4 MG/2ML IJ SOLN
INTRAMUSCULAR | Status: AC
  Filled 2015-11-13: qty 2

## 2015-11-13 MED ORDER — ARTIFICIAL TEARS OP OINT
TOPICAL_OINTMENT | OPHTHALMIC | Status: AC
  Filled 2015-11-13: qty 3.5

## 2015-11-13 MED ORDER — ONDANSETRON HCL 4 MG/2ML IJ SOLN
4.0000 mg | Freq: Four times a day (QID) | INTRAMUSCULAR | Status: DC | PRN
  Administered 2015-11-13 – 2015-11-14 (×2): 4 mg via INTRAVENOUS
  Filled 2015-11-13: qty 2

## 2015-11-13 MED ORDER — SODIUM CHLORIDE 0.9 % IJ SOLN
INTRAMUSCULAR | Status: AC
  Filled 2015-11-13: qty 10

## 2015-11-13 MED ORDER — DEXMEDETOMIDINE HCL IN NACL 200 MCG/50ML IV SOLN
INTRAVENOUS | Status: DC | PRN
  Administered 2015-11-13: .3 ug/kg/h via INTRAVENOUS

## 2015-11-13 MED ORDER — SUCCINYLCHOLINE CHLORIDE 20 MG/ML IJ SOLN
INTRAMUSCULAR | Status: AC
  Filled 2015-11-13: qty 1

## 2015-11-13 SURGICAL SUPPLY — 49 items
BENZOIN TINCTURE PRP APPL 2/3 (GAUZE/BANDAGES/DRESSINGS) ×3 IMPLANT
BIT DRILL SPINE QC 14 (BIT) ×3 IMPLANT
BLADE ULTRA TIP 2M (BLADE) ×3 IMPLANT
BNDG GAUZE ELAST 4 BULKY (GAUZE/BANDAGES/DRESSINGS) ×6 IMPLANT
BUR BARREL STRAIGHT FLUTE 4.0 (BURR) IMPLANT
BUR MATCHSTICK NEURO 3.0 LAGG (BURR) ×3 IMPLANT
CANISTER SUCT 3000ML PPV (MISCELLANEOUS) ×3 IMPLANT
CLOSURE WOUND 1/2 X4 (GAUZE/BANDAGES/DRESSINGS) ×1
CORDS BIPOLAR (ELECTRODE) ×3 IMPLANT
COVER MAYO STAND STRL (DRAPES) ×3 IMPLANT
DRAPE C-ARM 42X72 X-RAY (DRAPES) ×6 IMPLANT
DRAPE LAPAROTOMY 100X72 PEDS (DRAPES) ×3 IMPLANT
DRAPE MICROSCOPE LEICA (MISCELLANEOUS) ×3 IMPLANT
DRAPE POUCH INSTRU U-SHP 10X18 (DRAPES) ×3 IMPLANT
DRSG OPSITE POSTOP 4X6 (GAUZE/BANDAGES/DRESSINGS) ×3 IMPLANT
DURAPREP 6ML APPLICATOR 50/CS (WOUND CARE) ×3 IMPLANT
ELECT REM PT RETURN 9FT ADLT (ELECTROSURGICAL) ×3
ELECTRODE REM PT RTRN 9FT ADLT (ELECTROSURGICAL) ×1 IMPLANT
GAUZE SPONGE 4X4 12PLY STRL (GAUZE/BANDAGES/DRESSINGS) ×3 IMPLANT
GAUZE SPONGE 4X4 16PLY XRAY LF (GAUZE/BANDAGES/DRESSINGS) IMPLANT
GLOVE BIOGEL M 8.0 STRL (GLOVE) ×3 IMPLANT
GLOVE EXAM NITRILE LRG STRL (GLOVE) IMPLANT
GLOVE EXAM NITRILE MD LF STRL (GLOVE) IMPLANT
GLOVE EXAM NITRILE XL STR (GLOVE) IMPLANT
GLOVE EXAM NITRILE XS STR PU (GLOVE) IMPLANT
GOWN STRL REUS W/ TWL LRG LVL3 (GOWN DISPOSABLE) ×1 IMPLANT
GOWN STRL REUS W/ TWL XL LVL3 (GOWN DISPOSABLE) IMPLANT
GOWN STRL REUS W/TWL 2XL LVL3 (GOWN DISPOSABLE) IMPLANT
GOWN STRL REUS W/TWL LRG LVL3 (GOWN DISPOSABLE) ×2
GOWN STRL REUS W/TWL XL LVL3 (GOWN DISPOSABLE)
HEMOSTAT POWDER KIT SURGIFOAM (HEMOSTASIS) ×3 IMPLANT
KIT BASIN OR (CUSTOM PROCEDURE TRAY) ×3 IMPLANT
KIT ROOM TURNOVER OR (KITS) ×3 IMPLANT
NEEDLE SPNL 22GX3.5 QUINCKE BK (NEEDLE) ×3 IMPLANT
NS IRRIG 1000ML POUR BTL (IV SOLUTION) ×3 IMPLANT
PACK LAMINECTOMY NEURO (CUSTOM PROCEDURE TRAY) ×3 IMPLANT
PATTIES SURGICAL .5 X1 (DISPOSABLE) ×3 IMPLANT
PENCIL BUTTON BLDE SNGL 10FT (ELECTRODE) ×3 IMPLANT
PLATE RIGHT MEDIAL LATERAL (Plate) ×3 IMPLANT
RUBBERBAND STERILE (MISCELLANEOUS) ×6 IMPLANT
SCREW 4.2X14 (Screw) ×12 IMPLANT
SPACER CERV FRGE 12X14X10-7 (Spacer) ×3 IMPLANT
SPONGE INTESTINAL PEANUT (DISPOSABLE) ×6 IMPLANT
SPONGE SURGIFOAM ABS GEL SZ50 (HEMOSTASIS) ×3 IMPLANT
STRIP CLOSURE SKIN 1/2X4 (GAUZE/BANDAGES/DRESSINGS) ×2 IMPLANT
SUT VIC AB 3-0 SH 8-18 (SUTURE) ×3 IMPLANT
TOWEL OR 17X24 6PK STRL BLUE (TOWEL DISPOSABLE) ×3 IMPLANT
TOWEL OR 17X26 10 PK STRL BLUE (TOWEL DISPOSABLE) ×3 IMPLANT
WATER STERILE IRR 1000ML POUR (IV SOLUTION) ×3 IMPLANT

## 2015-11-13 NOTE — Anesthesia Postprocedure Evaluation (Signed)
Anesthesia Post Note  Patient: Corey Gilbert  Procedure(s) Performed: Procedure(s) (LRB): C6-7 Anterior cervical decompression/diskectomy/fusion (N/A)  Patient location during evaluation: PACU Anesthesia Type: General Level of consciousness: awake and alert and oriented Pain management: pain level controlled Vital Signs Assessment: post-procedure vital signs reviewed and stable Respiratory status: spontaneous breathing, nonlabored ventilation, respiratory function stable and patient connected to nasal cannula oxygen Cardiovascular status: blood pressure returned to baseline and stable Postop Assessment: no signs of nausea or vomiting Anesthetic complications: no    Last Vitals:  Filed Vitals:   11/13/15 1537 11/13/15 1551  BP:  141/91  Pulse:  76  Temp: 36.3 C 36.6 C  Resp:  15    Last Pain:  Filed Vitals:   11/13/15 1552  PainSc: Asleep                 Hatice Bubel A.

## 2015-11-13 NOTE — Progress Notes (Signed)
Orthopedic Tech Progress Note Patient Details:  Corey Gilbert [birth year] [id number]  Ortho Devices Type of Ortho Device: Soft collar Ortho Device/Splint Location: neck Ortho Device/Splint Interventions: Application   Lilit Cinelli 11/13/2015, 2:30 PM

## 2015-11-13 NOTE — Progress Notes (Signed)
Dr. Malen Gauze arrived at bedside, and ordered for Nurse to give patient another milligram of Dilaudid IV, and to also give five milligrams of Labetalol IV. Will enter orders and administer.

## 2015-11-13 NOTE — Progress Notes (Signed)
Nurse called and informed Dr. Malen Gauze of patients elevated blood pressure (160/125 right arm, 185/119 left arm). Dr. Malen Gauze ordered for patient to have 1 mg of Dilaudid IV. Will enter orders and administer.

## 2015-11-13 NOTE — Anesthesia Preprocedure Evaluation (Addendum)
Anesthesia Evaluation  Patient identified by MRN, date of birth, ID band Patient awake    Reviewed: Allergy & Precautions, NPO status , Patient's Chart, lab work & pertinent test results  History of Anesthesia Complications (+) PONV and history of anesthetic complications  Airway Mallampati: I  TM Distance: >3 FB Neck ROM: Full    Dental no notable dental hx. (+) Teeth Intact   Pulmonary shortness of breath and with exertion,    Pulmonary exam normal breath sounds clear to auscultation       Cardiovascular hypertension, + dysrhythmias  Rhythm:Regular Rate:Normal  RBBB Hypertensive this am. Rates his pain 11/10. Will treat pain and recheck BP. Patient has been told his BP was elevated in the past, however has never been Rx'd Nuclear stress test 11/04/2015 LVEF 54%, no evidence of ischemia   Neuro/Psych  Headaches, PSYCHIATRIC DISORDERS Anxiety Depression Bipolar Disorder    GI/Hepatic negative GI ROS, (+)     substance abuse  marijuana use, Hepatitis -, CHx/o Opioid abuse   Endo/Other    Renal/GU Renal disease  negative genitourinary   Musculoskeletal  (+) Arthritis , Cervical radiculopathy Failed back syndrome   Abdominal   Peds  Hematology Thrombocytopenia-mild   Anesthesia Other Findings   Reproductive/Obstetrics                          Anesthesia Physical Anesthesia Plan  ASA: II  Anesthesia Plan: General   Post-op Pain Management:    Induction: Intravenous  Airway Management Planned: Oral ETT  Additional Equipment:   Intra-op Plan:   Post-operative Plan: Extubation in OR  Informed Consent: I have reviewed the patients History and Physical, chart, labs and discussed the procedure including the risks, benefits and alternatives for the proposed anesthesia with the patient or authorized representative who has indicated his/her understanding and acceptance.   Dental advisory  given  Plan Discussed with: CRNA, Anesthesiologist and Surgeon  Anesthesia Plan Comments: (A-line for hemodynamic monitoring during procedure given difficulty controlling BP preop.)      Anesthesia Quick Evaluation

## 2015-11-13 NOTE — Consult Note (Signed)
Reason for Consult:   post op accelerated hypertension  Requesting Physician: Anesthesia Primary Cardiologist Dr Elease Hashimoto  HPI:   50 y/o male with a history of prior back surgery, seen by Dr Elease Hashimoto in July 2016 as a consult in the hospital. The pt had had back surgery in May 2016 complicated by wound infection. He was on home IV antibiotics and came to the ED with progressive weakness July 2016. In the ED he was noted to be hypoxic. There is mention of chest pain but the pt denies this. A Troponin was obtained and an EKG. His EKG showed RBBB and his Troponin was elevated at 0. 65. Initially Dr Elease Hashimoto was concerned the pt had a PE and CTA was performed. This was remarkable for pulmonary nodules but not PE. Before further work up could be done the pt left AMA.   He was seen in [facility] 11/01/15 for pre op C-spine clearance. The pt denies any chest pain He is a Counselling psychologist and very active. He does not smoke, no FMHx of CAD, no history of HTN, DM, or HLD.  A Myoview was done 11/04/15 and was low risk. Surgery today went well but post op he has had lots of pain, requiring high dose of fentanyl and when this wears off his B/P goes up. He is currently stable after IV Hydralazine. He had previously received Normodyne IV twice.   PMHx:  Past Medical History  Diagnosis Date  . Near syncope     due to opain, last time  early Sept 2013  . Depression   . Concussion   . Kidney stones   . Arthritis   . Family history of anesthesia complication     pon& v  . Bipolar disorder (HCC)   . Anxiety   . Headache   . Hepatitis     Hepatitis C - has been treated with Harvoni  . Complication of anesthesia   . PONV (postoperative nausea and vomiting)     not on most recent surgery    Past Surgical History  Procedure Laterality Date  . Wrist surgery  2013    left at [facility]  . Wrist surgery  2000    right  . Lumbar laminectomy/decompression microdiscectomy  06/14/2012   Procedure: LUMBAR LAMINECTOMY/DECOMPRESSION MICRODISCECTOMY 1 LEVEL;  Surgeon: Karn Cassis, MD;  Location: [facility];  Service: Neurosurgery;  Laterality: Left;  Left Lumbar Removal of Fragment/Laminectomy Lumbar Three-Four  . Back surgery  2013    at [facility]  . Sp arthro thumb*l*  2013  left  2000 right    bilateral tendon  release.   Marland Kitchen Anterior cervical decomp/discectomy fusion  09/27/2012    Procedure: ANTERIOR CERVICAL DECOMPRESSION/DISCECTOMY FUSION 2 LEVELS;  Surgeon: Karn Cassis, MD;  Location: [facility];  Service: Neurosurgery;  Laterality: N/A;  Cervical four-five, Cervical five-six,  Anterior cervical decompression/diskectomy/fusion/plate  . Lumbar wound debridement N/A 02/16/2015    Procedure: LUMBAR WOUND DEBRIDEMENT;  Surgeon: Coletta Memos, MD;  Location: [facility];  Service: Neurosurgery;  Laterality: N/A;  . Lithotripsy      SOCHx:  reports that he has never smoked. He has never used smokeless tobacco. He reports that he uses illicit drugs (Marijuana and Oxycodone). He reports that he does not drink alcohol.  FAMHx: Family History  Problem Relation Age of Onset  . Arrhythmia Mother     ALLERGIES: Allergies  Allergen Reactions  . Acetaminophen Other (  See Comments)    Avoids due to history of Hepatitis C  . Baclofen Other (See Comments)    Goes manic  . Gabapentin Other (See Comments)    Goes manic  . Ibuprofen Other (See Comments)    Goes manic  . Lyrica [Pregabalin] Other (See Comments)    Mania  . Toradol [Ketorolac Tromethamine] Other (See Comments)    Reacts with other medications; also, fever and mania  . Other Other (See Comments)    Antidepressants     ROS: Review of Systems: General: negative for chills, fever, night sweats or weight changes.  Cardiovascular: negative for chest pain, dyspnea on exertion, edema, orthopnea, palpitations, paroxysmal nocturnal dyspnea or shortness of breath HEENT: negative for any visual disturbances,  blindness, glaucoma Dermatological: negative for rash Respiratory: negative for cough, hemoptysis, or wheezing Urologic: negative for hematuria or dysuria Abdominal: negative for nausea, vomiting, diarrhea, bright red blood per rectum, melena, or hematemesis Neurologic: negative for visual changes, syncope, or dizziness Musculoskeletal: negative for back pain, joint pain, or swelling Psych: cooperative and appropriate All other systems reviewed and are otherwise negative except as noted above.   HOME MEDICATIONS: Prior to Admission medications   Medication Sig Start Date End Date Taking? Authorizing Provider  aspirin-acetaminophen-caffeine (EXCEDRIN MIGRAINE) [phone] MG per tablet Take 1 tablet by mouth every 6 (six) hours as needed for migraine.    Yes Historical Provider, MD  cyclobenzaprine (FLEXERIL) 10 MG tablet Take 10 mg by mouth every 8 (eight) hours as needed (pain).  01/28/15  Yes Historical Provider, MD  Oxycodone HCl 10 MG TABS Take 10 mg by mouth 4 (four) times daily as needed (pain).   Yes Historical Provider, MD    HOSPITAL MEDICATIONS: I have reviewed the patient's current medications.  VITALS: Blood pressure 139/97, pulse 72, temperature 97 F (36.1 C), temperature source Oral, resp. rate 16, SpO2 96 %.  PHYSICAL EXAM: General appearance: cooperative, mild distress and complaining of pain Neck: surgical dressing dry Lungs: clear to auscultation bilaterally Heart: regular rate and rhythm Abdomen: soft, non-tender; bowel sounds normal; no masses,  no organomegaly Extremities: extremities normal, atraumatic, no cyanosis or edema Skin: Skin color, texture, turgor normal. No rashes or lesions or multiple tattoos Neurologic: Grossly normal  LABS: Results for orders placed or performed during the hospital encounter of 11/13/15 (from the past 24 hour(s))  CBC     Status: Abnormal   Collection Time: 11/13/15  8:51 AM  Result Value Ref Range   WBC 8.5 4.0 - 10.5 K/uL     RBC 5.31 4.22 - 5.81 MIL/uL   Hemoglobin 16.2 13.0 - 17.0 g/dL   HCT 54.0 98.1 - 19.1 %   MCV 86.1 78.0 - 100.0 fL   MCH 30.5 26.0 - 34.0 pg   MCHC 35.4 30.0 - 36.0 g/dL   RDW 47.8 29.5 - 62.1 %   Platelets 134 (L) 150 - 400 K/uL  Comprehensive metabolic panel     Status: Abnormal   Collection Time: 11/13/15  8:51 AM  Result Value Ref Range   Sodium 141 135 - 145 mmol/L   Potassium 4.3 3.5 - 5.1 mmol/L   Chloride 101 101 - 111 mmol/L   CO2 27 22 - 32 mmol/L   Glucose, Bld 105 (H) 65 - 99 mg/dL   BUN 9 6 - 20 mg/dL   Creatinine, Ser 3.08 0.61 - 1.24 mg/dL   Calcium 65.7 8.9 - 84.6 mg/dL   Total Protein 7.3 6.5 - 8.1 g/dL  Albumin 4.2 3.5 - 5.0 g/dL   AST 24 15 - 41 U/L   ALT 17 17 - 63 U/L   Alkaline Phosphatase 64 38 - 126 U/L   Total Bilirubin 0.4 0.3 - 1.2 mg/dL   GFR calc non Af Amer >60 >60 mL/min   GFR calc Af Amer >60 >60 mL/min   Anion gap 13 5 - 15  Surgical pcr screen     Status: None   Collection Time: 11/13/15  8:59 AM  Result Value Ref Range   MRSA, PCR NEGATIVE NEGATIVE   Staphylococcus aureus NEGATIVE NEGATIVE    EKG: On 11/01/15- NSR RBBB  IMAGING: No results found.  IMPRESSION: Principal Problem:   Accelerated hypertension postop C-spine surgery Active Problems:   Cervical stenosis of spinal canal   Chest pain with moderate risk for cardiac etiology-low risk pre op Myoview   Affective bipolar disorder (HCC)   Chronic hepatitis C (HCC)   Cannabis abuse, continuous   RECOMMENDATION: Hydralazine prn. We will follow post op. Hopefully his B/P will normalize with pain control. MD to see.   Time Spent Directly with Patient: 40 minutes  Corine Shelter, [place]  [phone] beeper 11/13/2015, 2:27 PM    Attending Note:   The patient was seen and examined.  Agree with assessment and plan as noted above.  Changes made to the above note as needed.  Pt 's BP was a bit elevated this am but is almost normal at this point.  I would wait and not start  anything now but we should continue to monitor his BP while he is here.  No cardiac complications with surgery .   Vesta Mixer, Montez Hageman., MD, [facility] 11/13/2015, 2:43 PM [address],  [address] Office [phone] Pager [phone]

## 2015-11-13 NOTE — Addendum Note (Signed)
Addendum  created 11/13/15 1653 by Jodell Cipro, CRNA   Modules edited: Anesthesia Events, Narrator   Narrator:  Narrator: Event Log Edited

## 2015-11-13 NOTE — Progress Notes (Signed)
Spoke with on-call MD. Ativan /IV one time order entered per [facility]

## 2015-11-13 NOTE — Transfer of Care (Signed)
Immediate Anesthesia Transfer of Care Note  Patient: Corey Gilbert  Procedure(s) Performed: Procedure(s) with comments: C6-7 Anterior cervical decompression/diskectomy/fusion (N/A) - C6-7 Anterior cervical decompression/diskectomy/fusion  Patient Location: PACU  Anesthesia Type:General  Level of Consciousness: awake, alert  and oriented  Airway & Oxygen Therapy: Patient Spontanous Breathing and Patient connected to nasal cannula oxygen  Post-op Assessment: Report given to RN, Post -op Vital signs reviewed and stable and Patient moving all extremities  Post vital signs: Reviewed and stable  Last Vitals:  Filed Vitals:   11/13/15 1420 11/13/15 1425  BP:    Pulse: 69 72  Temp:    Resp: 21 16    Complications: No apparent anesthesia complications

## 2015-11-13 NOTE — Progress Notes (Signed)
Pt received into pacu with precedex drip at 0.43mcg/kg

## 2015-11-13 NOTE — Anesthesia Procedure Notes (Signed)
Procedure Name: Intubation Date/Time: 11/13/2015 11:39 AM Performed by: Orvilla Fus A Pre-anesthesia Checklist: Patient identified, Timeout performed, Emergency Drugs available, Suction available and Patient being monitored Patient Re-evaluated:Patient Re-evaluated prior to inductionOxygen Delivery Method: Circle system utilized Preoxygenation: Pre-oxygenation with 100% oxygen Intubation Type: IV induction Ventilation: Mask ventilation without difficulty and Oral airway inserted - appropriate to patient size Laryngoscope Size: Glidescope and 4 Grade View: Grade I Tube type: Oral Tube size: 7.5 mm Number of attempts: 1 Airway Equipment and Method: Rigid stylet and Video-laryngoscopy Placement Confirmation: ETT inserted through vocal cords under direct vision,  breath sounds checked- equal and bilateral and positive ETCO2 Secured at: 23 cm Tube secured with: Tape Dental Injury: Teeth and Oropharynx as per pre-operative assessment

## 2015-11-13 NOTE — Progress Notes (Signed)
Patient arrived to 5C10 at 1544 from PACU. Patient alert and oriented X4 with c/o excruciating pain 10/10. Patient restless and agitated regarding pain control. Requested PCA pump to manage his pain. Dr. Jeral Fruit notified and PCA pump ordered. Advised patient that we would get pump connected as soon as possible. Patient continues to yell out about his pain not being managed. Notified MD on call and 2 mg ativan ordered and given. Wife at bedside.

## 2015-11-14 ENCOUNTER — Encounter (HOSPITAL_COMMUNITY): Payer: Self-pay | Admitting: Neurosurgery

## 2015-11-14 DIAGNOSIS — M50123 Cervical disc disorder at C6-C7 level with radiculopathy: Secondary | ICD-10-CM | POA: Diagnosis not present

## 2015-11-14 MED ORDER — LORAZEPAM 2 MG/ML IJ SOLN: 2.0000 mg | Freq: Once | INTRAMUSCULAR | Status: DC | PRN

## 2015-11-14 NOTE — Op Note (Signed)
NAME:  Corey Gilbert, Corey Gilbert NO.:  [id number]  MEDICAL RECORD NO.:  [id number]  LOCATION:  [facility]                         FACILITY:  [facility]  PHYSICIAN:  Hilda Lias, M.D.   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  Status post C4-C6 fusion.  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  Status post C4-C6 fusion.  PROCEDURE:  anterior__________ C6-7 diskectomy.  Decompression of the spinal cord.  Foraminotomy. Lysis of adhesions.  Interbody fusion with allograft followed by a plate.  Microscope.  SURGEON:  Hilda Lias, M.D.  ASSISTANT:  _cabbell_________  CLINICAL HISTORY:  Corey Gilbert is a gentleman who in the past underwent fusion between C4-C6.  He did well but lately he is complaining of neck pain, radiating to both upper extremities.  We had our myelogram which showed that he has degenerative disk disease with stenosis at the level of C6-7.  Surgery was advised.  He and his wife knew the risk with the surgery.  DESCRIPTION OF PROCEDURE:  The patient was taken to the OR, and after intubation, the left side of the neck was cleaned with DuraPrep. Transverse incision was made through the skin and subcutaneous tissue. Immediately, we found quite a bit of scar tissue from previous surgery. Dissection was carried all the way down to the __c67________ area.  We found the lower part of the plate and it was difficult to see the disk itself.  We had to do an x-ray just to be sure that we were right at the level C6-7.  Then, we brought the microscope and with a drill, we drilled our way all the way down to the posterior ligament where he had a calcified ligament with adhesion.  Lysis was accomplished, foraminotomy was done.  At the end, we introduced our  allograft, 10 mm height, lordotic, followed by 4 screws.  We were able to lift the previous plate in place.  Lateral cervical spine showed good position of the upper part of the plate, the lower we were unable to see it because of the size of the shoulder.  The area was irrigated, a drain was left, and the wound was closed with Vicryl and Steri-Strips.          ______________________________ Hilda Lias, M.D.    EB/MEDQ  D:  11/13/2015  T:  11/13/2015  Job:  [id number]

## 2015-11-14 NOTE — Progress Notes (Signed)
     Pt feels well. He refuses to take all BP meds. We discussed the risk of CVA , CHF, and kidney disease related to chronic HTN He says that he understands but still refuses to take any meds.  Will sign off    Nahser, Deloris Ping, MD  11/14/2015 12:42 PM    [facility] [address] [place],  [address] [place], [place]  [address] Pager [phone] Phone: [phone]; Fax: [phone]

## 2015-11-14 NOTE — Evaluation (Signed)
Physical Therapy Evaluation Patient Details Name: Corey Gilbert MRN: [id number] DOB: 19-Jun-1966 Today's Date: 11/14/2015   History of Present Illness  Pt is a 50 y.o. male s/p C6-7 ACDF. PMHx: Bipolar disorder, Depression, Concussion, Anxiety, HepatitisC  Clinical Impression  Pt doing well with mobility and no further PT needed.  Ready for dc from PT standpoint.      Follow Up Recommendations No PT follow up    Equipment Recommendations  None recommended by PT    Recommendations for Other Services       Precautions / Restrictions Precautions Precautions: Cervical Precaution Comments: Educated pt and wife on cervical precautions and provided handout. Required Braces or Orthoses: Cervical Brace Cervical Brace: Soft collar;At all times Restrictions Weight Bearing Restrictions: No      Mobility  Bed Mobility Overal bed mobility: Modified Independent                Transfers Overall transfer level: Independent Equipment used: None                Ambulation/Gait Ambulation/Gait assistance: Independent Ambulation Distance (Feet): 1000 Feet Assistive device: None Gait Pattern/deviations: WFL(Within Functional Limits)   Gait velocity interpretation: at or above normal speed for age/gender General Gait Details: Steady gait  Stairs Stairs: Yes Stairs assistance: Modified independent (Device/Increase time) Stair Management: One rail Right;Alternating pattern;Forwards Number of Stairs: 5    Wheelchair Mobility    Modified Rankin (Stroke Patients Only)       Balance Overall balance assessment: No apparent balance deficits (not formally assessed)                                           Pertinent Vitals/Pain Pain Assessment: 0-10 Pain Score: 7  Pain Location: neck, incision Pain Descriptors / Indicators: Aching Pain Intervention(s): Limited activity within patient's tolerance;Monitored during session    Home Living  Family/patient expects to be discharged to:: Private residence Living Arrangements: Spouse/significant other;Parent Available Help at Discharge: Family;Available 24 hours/day Type of Home: House Home Access: Level entry     Home Layout: Two level;Bed/bath upstairs Home Equipment: Cane - single point;Grab bars - tub/shower      Prior Function Level of Independence: Independent               Hand Dominance        Extremity/Trunk Assessment   Upper Extremity Assessment: Defer to OT evaluation           Lower Extremity Assessment: Overall WFL for tasks assessed      Cervical / Trunk Assessment: Other exceptions  Communication   Communication: No difficulties  Cognition Arousal/Alertness: Awake/alert Behavior During Therapy: Impulsive Overall Cognitive Status: Within Functional Limits for tasks assessed                      General Comments      Exercises        Assessment/Plan    PT Assessment Patent does not need any further PT services  PT Diagnosis Difficulty walking   PT Problem List    PT Treatment Interventions     PT Goals (Current goals can be found in the Care Plan section) Acute Rehab PT Goals Patient Stated Goal: return home PT Goal Formulation: All assessment and education complete, DC therapy    Frequency     Barriers to discharge  Co-evaluation               End of Session Equipment Utilized During Treatment: Cervical collar Activity Tolerance: Patient tolerated treatment well Patient left: in bed;with call bell/phone within reach;with family/visitor present Nurse Communication: Mobility status         Time: 0950-1010 PT Time Calculation (min) (ACUTE ONLY): 20 min   Charges:   PT Evaluation $PT Eval Low Complexity: 1 Procedure     PT G Codes:        Grantham Hippert Nov 30, 2015, 10:20 AM Kasia Trego PT [phone]

## 2015-11-14 NOTE — Progress Notes (Signed)
Patient accidentally pulled the J-P drain out of the cervical incision. Honeycomb dressing loosely reinforced. Neurosurgery notified. No new order received. Will monitor closely overnight.

## 2015-11-14 NOTE — Care Management Note (Signed)
Case Management Note  Patient Details  Name: Corey Gilbert MRN: [id number] Date of Birth: [birth year]  Subjective/Objective:     Patient admitted and underwent cervical surgery. Patient is from home.                Action/Plan: Awaiting PT/OT recommendations. CM following for discharge disposition.   Expected Discharge Date:                  Expected Discharge Plan:     In-House Referral:     Discharge planning Services     Post Acute Care Choice:    Choice offered to:     DME Arranged:    DME Agency:     HH Arranged:    HH Agency:     Status of Service:  In process, will continue to follow  Medicare Important Message Given:    Date Medicare IM Given:    Medicare IM give by:    Date Additional Medicare IM Given:    Additional Medicare Important Message give by:     If discussed at Long Length of Stay Meetings, dates discussed:    Additional Comments:  Kermit Balo, RN 11/14/2015, 11:59 AM

## 2015-11-14 NOTE — Care Management Note (Signed)
Case Management Note  Patient Details  Name: Aravind Chrismer MRN: [id number] Date of Birth: 1966-07-26  Subjective/Objective:                    Action/Plan: Pt discharging home with self care. No f/u recommendations per PT/OT. No further needs per CM.   Expected Discharge Date:                  Expected Discharge Plan:     In-House Referral:     Discharge planning Services     Post Acute Care Choice:    Choice offered to:     DME Arranged:    DME Agency:     HH Arranged:    HH Agency:     Status of Service:  In process, will continue to follow  Medicare Important Message Given:    Date Medicare IM Given:    Medicare IM give by:    Date Additional Medicare IM Given:    Additional Medicare Important Message give by:     If discussed at Long Length of Stay Meetings, dates discussed:    Additional Comments:  Kermit Balo, RN 11/14/2015, 3:15 PM

## 2015-11-14 NOTE — Discharge Summary (Signed)
Physician Discharge Summary  Patient ID: Corey Gilbert MRN: [id number] DOB/AGE: 04/15/1966 50 y.o.  Admit date: 11/13/2015 Discharge date: 11/14/2015  Admission Diagnoses:c6-7   Arterial hypertension  Discharge Diagnoses:  Principal Problem:   Accelerated hypertension postop C-spine surgery Active Problems:   Affective bipolar disorder (HCC)   Chronic hepatitis C (HCC)   Cannabis abuse, continuous   Chest pain with moderate risk for cardiac etiology-low risk pre op Myoview   Cervical stenosis of spinal canal   Discharged Condition: stable  Hospital Course: surgery  Consults  cardiology  Significant Diagnostic Studies:=myelo  Treatments: c67 fusion  Discharge Exam: Blood pressure 194/117, pulse 84, temperature 98.2 F (36.8 C), temperature source Oral, resp. rate 15, SpO2 94 %. No pain or weaknss  Disposition: wants to go home. The drain came out this am  Staples were used. He declined to take medications for arterial hypertension. See cardio notes    Medication List    ASK your doctor about these medications        aspirin-acetaminophen-caffeine 250-250-65 MG tablet  Commonly known as:  EXCEDRIN MIGRAINE  Take 1 tablet by mouth every 6 (six) hours as needed for migraine.     cyclobenzaprine 10 MG tablet  Commonly known as:  FLEXERIL  Take 10 mg by mouth every 8 (eight) hours as needed (pain).     Oxycodone HCl 10 MG Tabs  Take 10 mg by mouth 4 (four) times daily as needed (pain).         Signed: Karn Cassis 11/14/2015, 2:01 PM

## 2015-11-14 NOTE — Evaluation (Signed)
Occupational Therapy Evaluation Patient Details Name: Corey Gilbert MRN: [id number] DOB: [birth year] Today's Date: 11/14/2015    History of Present Illness Pt is a 50 y.o. male s/p C6-7 ACDF. PMHx: Bipolar disorder, Depression, Concussion, Anxiety, HepatitisC   Clinical Impression   Pt reports he was independent with ADLs PTA. Currently pt is overall supervision for safety with ADLs and functional mobility secondary to impulsivity and decreased safety awareness with cervical precautions; otherwise pt would be at a mod I level. All safety, ADL, and cervical education complete; pt and wife with no further questions or concerns. Pt planning to d/c home with 24/7 supervision from family. No acute OT needs identified; signing off at this time. Thank you for this referral.     Follow Up Recommendations  No OT follow up;Supervision - Intermittent    Equipment Recommendations  None recommended by OT    Recommendations for Other Services       Precautions / Restrictions Precautions Precautions: Cervical Precaution Comments: Educated pt and wife on cervical precautions and provided handout. Required Braces or Orthoses: Cervical Brace Cervical Brace: Soft collar;At all times Restrictions Weight Bearing Restrictions: No      Mobility Bed Mobility Overal bed mobility: Modified Independent                Transfers Overall transfer level: Independent Equipment used: None                  Balance Overall balance assessment: No apparent balance deficits (not formally assessed)                                          ADL Overall ADL's : Needs assistance/impaired                                       General ADL Comments: Pt currently overall supervision for safety with ADLs and functional mobility secondary to impulsivity. Pts wife present for OT eval. Educated on cervical precautions with functional activies, use of 2 cups for oral  care, placing frequently used items at counter top height.     Vision     Perception     Praxis      Pertinent Vitals/Pain Pain Assessment: 0-10 Pain Score: 7  Pain Location: neck, incision Pain Descriptors / Indicators: Aching Pain Intervention(s): Limited activity within patient's tolerance;Monitored during session     Hand Dominance     Extremity/Trunk Assessment Upper Extremity Assessment Upper Extremity Assessment: Overall WFL for tasks assessed   Lower Extremity Assessment Lower Extremity Assessment: Overall WFL for tasks assessed   Cervical / Trunk Assessment Cervical / Trunk Assessment: Other exceptions Cervical / Trunk Exceptions: hx of lumbar sx, s/p cervical sx   Communication Communication Communication: No difficulties   Cognition Arousal/Alertness: Awake/alert Behavior During Therapy: Impulsive Overall Cognitive Status: Within Functional Limits for tasks assessed                     General Comments       Exercises       Shoulder Instructions      Home Living Family/patient expects to be discharged to:: Private residence Living Arrangements: Spouse/significant other;Parent Available Help at Discharge: Family;Available 24 hours/day Type of Home: House Home Access: Level entry     Home Layout: Two  level;Bed/bath upstairs Alternate Level Stairs-Number of Steps: flight Alternate Level Stairs-Rails: Left Bathroom Shower/Tub: Tub/shower unit Shower/tub characteristics: Engineer, building services: Standard     Home Equipment: Cane - single point;Grab bars - tub/shower          Prior Functioning/Environment Level of Independence: Independent             OT Diagnosis: Acute pain   OT Problem List:     OT Treatment/Interventions:      OT Goals(Current goals can be found in the care plan section) Acute Rehab OT Goals Patient Stated Goal: return home OT Goal Formulation: All assessment and education complete, DC therapy  OT  Frequency:     Barriers to D/C:            Co-evaluation              End of Session Equipment Utilized During Treatment: Cervical collar Nurse Communication: Other (comment) (MD notified pts incision bleeding)  Activity Tolerance: Patient tolerated treatment well Patient left: in bed;with call bell/phone within reach;with family/visitor present   Time: 4782-9562 OT Time Calculation (min): 14 min Charges:  OT General Charges $OT Visit: 1 Procedure OT Evaluation $OT Eval Low Complexity: 1 Procedure G-Codes:     Gaye Alken M.S., OTR/L Pager: [phone]  11/14/2015, 9:33 AM

## 2015-11-14 NOTE — Progress Notes (Signed)
Pt for discharge home today. Discharge orders received. IV and dcd. Discharge instructions and and handouts given. Family at bedside to assist with discharge. Staff brought patient to lobby via wheelchair at this time.Transported to home by spouse.

## 2015-11-15 NOTE — Progress Notes (Signed)
Late entry for missed g-code    2015/12/01 1700  OT G-codes **NOT FOR INPATIENT CLASS**  Functional Assessment Tool Used Clinical judgement  Functional Limitation Self care  Self Care Current Status [phone]) [facility]  Self Care Goal Status (X9147) [facility]  Self Care Discharge Status [phone]) [facility]    Hurshel Party, M.S., OTR/L Pager: [phone]  11/14/2015

## 2015-11-18 NOTE — Progress Notes (Signed)
PT Note - Late G Code Entry    12/01/15 1021  PT G-Codes **NOT FOR INPATIENT CLASS**  Functional Assessment Tool Used clinical judgement  Functional Limitation Mobility: Walking and moving around  Mobility: Walking and Moving Around Current Status (Z6109) [facility]  Mobility: Walking and Moving Around Goal Status [phone]) [facility]  Mobility: Walking and Moving Around Discharge Status [phone]) [facility] Corey Gilbert PT [phone]

## 2015-11-20 ENCOUNTER — Encounter (HOSPITAL_COMMUNITY): Payer: Self-pay | Admitting: Neurosurgery

## 2015-11-26 ENCOUNTER — Encounter (HOSPITAL_COMMUNITY): Payer: Self-pay | Admitting: Neurosurgery

## 2015-11-28 ENCOUNTER — Other Ambulatory Visit: Payer: Self-pay | Admitting: Neurosurgery

## 2015-11-28 DIAGNOSIS — M5412 Radiculopathy, cervical region: Secondary | ICD-10-CM

## 2015-11-29 ENCOUNTER — Ambulatory Visit
Admission: RE | Admit: 2015-11-29 | Discharge: 2015-11-29 | Disposition: A | Discharge status: Home / Self Care | Payer: Medicaid Other | Source: Ambulatory Visit | Attending: Neurosurgery | Admitting: Neurosurgery

## 2015-11-29 ENCOUNTER — Encounter (HOSPITAL_COMMUNITY): Payer: Self-pay | Admitting: Anesthesiology

## 2015-11-29 ENCOUNTER — Other Ambulatory Visit: Payer: Self-pay | Admitting: Neurosurgery

## 2015-11-29 ENCOUNTER — Ambulatory Visit (HOSPITAL_COMMUNITY): Payer: Medicaid Other

## 2015-11-29 ENCOUNTER — Ambulatory Visit (HOSPITAL_COMMUNITY): Payer: Medicaid Other | Admitting: Anesthesiology

## 2015-11-29 ENCOUNTER — Encounter (HOSPITAL_COMMUNITY)
Admission: AD | Disposition: A | Discharge status: Home / Self Care | Payer: Self-pay | Source: Ambulatory Visit | Attending: Neurosurgery

## 2015-11-29 ENCOUNTER — Inpatient Hospital Stay (HOSPITAL_COMMUNITY)
Admission: AD | Admit: 2015-11-29 | Discharge: 2015-11-30 | DRG: 497 | Disposition: A | Discharge status: Home / Self Care | Payer: Medicaid Other | Source: Ambulatory Visit | Attending: Neurosurgery | Admitting: Neurosurgery

## 2015-11-29 DIAGNOSIS — Z888 Allergy status to other drugs, medicaments and biological substances status: Secondary | ICD-10-CM | POA: Diagnosis not present

## 2015-11-29 DIAGNOSIS — I10 Essential (primary) hypertension: Secondary | ICD-10-CM | POA: Diagnosis present

## 2015-11-29 DIAGNOSIS — Y752 Prosthetic and other implants, materials and neurological devices associated with adverse incidents: Secondary | ICD-10-CM | POA: Diagnosis present

## 2015-11-29 DIAGNOSIS — M5412 Radiculopathy, cervical region: Secondary | ICD-10-CM

## 2015-11-29 DIAGNOSIS — Z886 Allergy status to analgesic agent status: Secondary | ICD-10-CM | POA: Diagnosis not present

## 2015-11-29 DIAGNOSIS — Z79899 Other long term (current) drug therapy: Secondary | ICD-10-CM | POA: Diagnosis not present

## 2015-11-29 DIAGNOSIS — Z87891 Personal history of nicotine dependence: Secondary | ICD-10-CM

## 2015-11-29 DIAGNOSIS — T84226A Displacement of internal fixation device of vertebrae, initial encounter: Principal | ICD-10-CM | POA: Diagnosis present

## 2015-11-29 DIAGNOSIS — Z981 Arthrodesis status: Secondary | ICD-10-CM | POA: Diagnosis not present

## 2015-11-29 DIAGNOSIS — B182 Chronic viral hepatitis C: Secondary | ICD-10-CM | POA: Diagnosis present

## 2015-11-29 DIAGNOSIS — M542 Cervicalgia: Secondary | ICD-10-CM | POA: Diagnosis present

## 2015-11-29 DIAGNOSIS — R030 Elevated blood-pressure reading, without diagnosis of hypertension: Secondary | ICD-10-CM

## 2015-11-29 DIAGNOSIS — IMO0001 Reserved for inherently not codable concepts without codable children: Secondary | ICD-10-CM | POA: Diagnosis present

## 2015-11-29 DIAGNOSIS — M47812 Spondylosis without myelopathy or radiculopathy, cervical region: Secondary | ICD-10-CM | POA: Diagnosis present

## 2015-11-29 DIAGNOSIS — M4802 Spinal stenosis, cervical region: Secondary | ICD-10-CM | POA: Diagnosis present

## 2015-11-29 DIAGNOSIS — F319 Bipolar disorder, unspecified: Secondary | ICD-10-CM | POA: Diagnosis present

## 2015-11-29 DIAGNOSIS — M1990 Unspecified osteoarthritis, unspecified site: Secondary | ICD-10-CM | POA: Diagnosis present

## 2015-11-29 DIAGNOSIS — F419 Anxiety disorder, unspecified: Secondary | ICD-10-CM | POA: Diagnosis present

## 2015-11-29 DIAGNOSIS — Z419 Encounter for procedure for purposes other than remedying health state, unspecified: Secondary | ICD-10-CM

## 2015-11-29 HISTORY — PX: ANTERIOR CERVICAL DECOMP/DISCECTOMY FUSION: SHX1161

## 2015-11-29 SURGERY — ANTERIOR CERVICAL DECOMPRESSION/DISCECTOMY FUSION 1 LEVEL
Anesthesia: General

## 2015-11-29 MED ORDER — THROMBIN 5000 UNITS EX SOLR
CUTANEOUS | Status: DC | PRN
  Administered 2015-11-29 (×2): 5000 [IU] via TOPICAL

## 2015-11-29 MED ORDER — 0.9 % SODIUM CHLORIDE (POUR BTL) OPTIME
TOPICAL | Status: DC | PRN
  Administered 2015-11-29: 1000 mL

## 2015-11-29 MED ORDER — LABETALOL HCL 5 MG/ML IV SOLN
10.0000 mg | INTRAVENOUS | Status: DC | PRN
  Administered 2015-11-29: 10 mg via INTRAVENOUS

## 2015-11-29 MED ORDER — CEFAZOLIN SODIUM-DEXTROSE 2-3 GM-% IV SOLR
INTRAVENOUS | Status: DC | PRN
Start: 2015-11-29 — End: 2015-11-29
  Administered 2015-11-29: 2 g via INTRAVENOUS

## 2015-11-29 MED ORDER — DIPHENHYDRAMINE HCL 12.5 MG/5ML PO ELIX: 12.5000 mg | ORAL_SOLUTION | Freq: Four times a day (QID) | ORAL | Status: DC | PRN

## 2015-11-29 MED ORDER — GELATIN ABSORBABLE MT POWD
OROMUCOSAL | Status: DC | PRN
  Administered 2015-11-29 (×2): via TOPICAL

## 2015-11-29 MED ORDER — LABETALOL HCL 5 MG/ML IV SOLN
INTRAVENOUS | Status: DC | PRN
  Administered 2015-11-29 (×2): 5 mg via INTRAVENOUS
  Administered 2015-11-29: 10 mg via INTRAVENOUS

## 2015-11-29 MED ORDER — SODIUM CHLORIDE 0.9% FLUSH: 9.0000 mL | INTRAVENOUS | Status: DC | PRN

## 2015-11-29 MED ORDER — SODIUM CHLORIDE 0.9 % IV SOLN
INTRAVENOUS | Status: DC
  Administered 2015-11-29: 75 mL/h via INTRAVENOUS

## 2015-11-29 MED ORDER — HYDROMORPHONE HCL 1 MG/ML IJ SOLN
INTRAMUSCULAR | Status: AC
  Administered 2015-11-29: 23:00:00
  Filled 2015-11-29: qty 2

## 2015-11-29 MED ORDER — TRIAMCINOLONE ACETONIDE 40 MG/ML IJ SUSP (RADIOLOGY)
60.0000 mg | Freq: Once | INTRAMUSCULAR | Status: AC
  Administered 2015-11-29: 60 mg via EPIDURAL

## 2015-11-29 MED ORDER — ONDANSETRON HCL 4 MG/2ML IJ SOLN: 4.0000 mg | Freq: Four times a day (QID) | INTRAMUSCULAR | Status: DC | PRN

## 2015-11-29 MED ORDER — MORPHINE SULFATE 2 MG/ML IV SOLN
INTRAVENOUS | Status: AC
  Filled 2015-11-29: qty 25

## 2015-11-29 MED ORDER — FENTANYL CITRATE (PF) 250 MCG/5ML IJ SOLN
INTRAMUSCULAR | Status: AC
  Filled 2015-11-29: qty 5

## 2015-11-29 MED ORDER — FENTANYL CITRATE (PF) 100 MCG/2ML IJ SOLN
INTRAMUSCULAR | Status: DC | PRN
  Administered 2015-11-29 (×3): 50 ug via INTRAVENOUS
  Administered 2015-11-29: 150 ug via INTRAVENOUS
  Administered 2015-11-29 (×2): 50 ug via INTRAVENOUS
  Administered 2015-11-29: 100 ug via INTRAVENOUS

## 2015-11-29 MED ORDER — OXYCODONE HCL 5 MG PO TABS
10.0000 mg | ORAL_TABLET | Freq: Four times a day (QID) | ORAL | Status: DC | PRN
  Administered 2015-11-30 (×2): 10 mg via ORAL
  Filled 2015-11-29 (×2): qty 2

## 2015-11-29 MED ORDER — PHENOL 1.4 % MT LIQD: 1.0000 | OROMUCOSAL | Status: DC | PRN

## 2015-11-29 MED ORDER — HYDROMORPHONE HCL 1 MG/ML IJ SOLN
INTRAMUSCULAR | Status: AC
  Filled 2015-11-29: qty 2

## 2015-11-29 MED ORDER — OXYCODONE HCL 5 MG/5ML PO SOLN: 5.0000 mg | Freq: Once | ORAL | Status: AC | PRN

## 2015-11-29 MED ORDER — CYCLOBENZAPRINE HCL 10 MG PO TABS: 10.0000 mg | ORAL_TABLET | Freq: Three times a day (TID) | ORAL | Status: DC | PRN

## 2015-11-29 MED ORDER — MORPHINE SULFATE 2 MG/ML IV SOLN
INTRAVENOUS | Status: DC
  Administered 2015-11-29: 23:00:00 via INTRAVENOUS
  Administered 2015-11-30: 20.98 mg via INTRAVENOUS
  Administered 2015-11-30: 23.47 mg via INTRAVENOUS
  Filled 2015-11-29 (×2): qty 25

## 2015-11-29 MED ORDER — ONDANSETRON HCL 4 MG/2ML IJ SOLN
INTRAMUSCULAR | Status: DC | PRN
  Administered 2015-11-29: 4 mg via INTRAVENOUS

## 2015-11-29 MED ORDER — DIAZEPAM 5 MG PO TABS
5.0000 mg | ORAL_TABLET | Freq: Four times a day (QID) | ORAL | Status: DC | PRN
  Administered 2015-11-29 – 2015-11-30 (×3): 5 mg via ORAL
  Filled 2015-11-29 (×2): qty 1

## 2015-11-29 MED ORDER — DIPHENHYDRAMINE HCL 50 MG/ML IJ SOLN: 12.5000 mg | Freq: Four times a day (QID) | INTRAMUSCULAR | Status: DC | PRN

## 2015-11-29 MED ORDER — MIDAZOLAM HCL 5 MG/5ML IJ SOLN
INTRAMUSCULAR | Status: DC | PRN
  Administered 2015-11-29: 2 mg via INTRAVENOUS

## 2015-11-29 MED ORDER — MIDAZOLAM HCL 2 MG/2ML IJ SOLN
INTRAMUSCULAR | Status: AC
  Filled 2015-11-29: qty 2

## 2015-11-29 MED ORDER — ROCURONIUM BROMIDE 100 MG/10ML IV SOLN
INTRAVENOUS | Status: DC | PRN
  Administered 2015-11-29: 10 mg via INTRAVENOUS
  Administered 2015-11-29: 30 mg via INTRAVENOUS
  Administered 2015-11-29: 10 mg via INTRAVENOUS
  Administered 2015-11-29: 20 mg via INTRAVENOUS
  Administered 2015-11-29: 10 mg via INTRAVENOUS

## 2015-11-29 MED ORDER — SUGAMMADEX SODIUM 200 MG/2ML IV SOLN
INTRAVENOUS | Status: AC
  Filled 2015-11-29: qty 2

## 2015-11-29 MED ORDER — CEFAZOLIN SODIUM 1-5 GM-% IV SOLN
1.0000 g | Freq: Three times a day (TID) | INTRAVENOUS | Status: AC
  Administered 2015-11-30 (×2): 1 g via INTRAVENOUS
  Filled 2015-11-29 (×2): qty 50

## 2015-11-29 MED ORDER — IOHEXOL 300 MG/ML  SOLN
1.0000 mL | Freq: Once | INTRAMUSCULAR | Status: AC | PRN
  Administered 2015-11-29: 1 mL via EPIDURAL

## 2015-11-29 MED ORDER — DIAZEPAM 5 MG PO TABS
ORAL_TABLET | ORAL | Status: AC
  Filled 2015-11-29: qty 1

## 2015-11-29 MED ORDER — MENTHOL 3 MG MT LOZG: 1.0000 | LOZENGE | OROMUCOSAL | Status: DC | PRN

## 2015-11-29 MED ORDER — OXYCODONE HCL 5 MG PO TABS
5.0000 mg | ORAL_TABLET | Freq: Once | ORAL | Status: AC | PRN
  Administered 2015-11-29: 5 mg via ORAL

## 2015-11-29 MED ORDER — LABETALOL HCL 5 MG/ML IV SOLN
INTRAVENOUS | Status: AC
Start: 2015-11-29 — End: 2015-11-29
  Administered 2015-11-29: 20 mg
  Filled 2015-11-29: qty 4

## 2015-11-29 MED ORDER — HYDROMORPHONE HCL 1 MG/ML IJ SOLN
0.5000 mg | INTRAMUSCULAR | Status: AC | PRN
  Administered 2015-11-29 (×4): 0.5 mg via INTRAVENOUS

## 2015-11-29 MED ORDER — ROCURONIUM BROMIDE 50 MG/5ML IV SOLN
INTRAVENOUS | Status: AC
  Filled 2015-11-29: qty 2

## 2015-11-29 MED ORDER — OXYCODONE HCL 5 MG PO TABS
ORAL_TABLET | ORAL | Status: AC
  Filled 2015-11-29: qty 1

## 2015-11-29 MED ORDER — PROPOFOL 10 MG/ML IV BOLUS
INTRAVENOUS | Status: DC | PRN
  Administered 2015-11-29: 200 mg via INTRAVENOUS

## 2015-11-29 MED ORDER — SUGAMMADEX SODIUM 200 MG/2ML IV SOLN
INTRAVENOUS | Status: DC | PRN
  Administered 2015-11-29: 200 mg via INTRAVENOUS

## 2015-11-29 MED ORDER — SUCCINYLCHOLINE CHLORIDE 20 MG/ML IJ SOLN
INTRAMUSCULAR | Status: DC | PRN
  Administered 2015-11-29: 100 mg via INTRAVENOUS

## 2015-11-29 MED ORDER — HEMOSTATIC AGENTS (NO CHARGE) OPTIME
TOPICAL | Status: DC | PRN
  Administered 2015-11-29: 1 via TOPICAL

## 2015-11-29 MED ORDER — HYDROMORPHONE HCL 1 MG/ML IJ SOLN
0.2500 mg | INTRAMUSCULAR | Status: DC | PRN
  Administered 2015-11-29 (×4): 0.5 mg via INTRAVENOUS

## 2015-11-29 MED ORDER — PROPOFOL 10 MG/ML IV BOLUS
INTRAVENOUS | Status: AC
  Filled 2015-11-29: qty 20

## 2015-11-29 MED ORDER — LACTATED RINGERS IV SOLN
INTRAVENOUS | Status: DC | PRN
  Administered 2015-11-29 (×2): via INTRAVENOUS

## 2015-11-29 MED ORDER — NALOXONE HCL 0.4 MG/ML IJ SOLN: 0.4000 mg | INTRAMUSCULAR | Status: DC | PRN

## 2015-11-29 MED ORDER — LIDOCAINE HCL (CARDIAC) 20 MG/ML IV SOLN
INTRAVENOUS | Status: DC | PRN
  Administered 2015-11-29: 50 mg via INTRAVENOUS

## 2015-11-29 SURGICAL SUPPLY — 64 items
BENZOIN TINCTURE PRP APPL 2/3 (GAUZE/BANDAGES/DRESSINGS) ×3 IMPLANT
BIT DRILL NEURO 2X3.1 SFT TUCH (MISCELLANEOUS) ×1 IMPLANT
BLADE ULTRA TIP 2M (BLADE) ×3 IMPLANT
BNDG GAUZE ELAST 4 BULKY (GAUZE/BANDAGES/DRESSINGS) ×6 IMPLANT
BUR BARREL STRAIGHT FLUTE 4.0 (BURR) IMPLANT
BUR MATCHSTICK NEURO 3.0 LAGG (BURR) ×3 IMPLANT
CANISTER SUCT 3000ML PPV (MISCELLANEOUS) ×3 IMPLANT
CAP LOCKING ALTA G2 TI (Cap) ×9 IMPLANT
CLOSURE WOUND 1/2 X4 (GAUZE/BANDAGES/DRESSINGS) ×1
COVER MAYO STAND STRL (DRAPES) ×3 IMPLANT
DRAPE C-ARM 42X72 X-RAY (DRAPES) ×6 IMPLANT
DRAPE LAPAROTOMY 100X72 PEDS (DRAPES) ×3 IMPLANT
DRAPE MICROSCOPE LEICA (MISCELLANEOUS) ×3 IMPLANT
DRAPE POUCH INSTRU U-SHP 10X18 (DRAPES) ×3 IMPLANT
DRILL NEURO 2X3.1 SOFT TOUCH (MISCELLANEOUS) ×3
DURAPREP 6ML APPLICATOR 50/CS (WOUND CARE) ×3 IMPLANT
ELECT BLADE 4.0 EZ CLEAN MEGAD (MISCELLANEOUS) ×3
ELECT REM PT RETURN 9FT ADLT (ELECTROSURGICAL) ×3
ELECTRODE BLDE 4.0 EZ CLN MEGD (MISCELLANEOUS) ×1 IMPLANT
ELECTRODE REM PT RTRN 9FT ADLT (ELECTROSURGICAL) ×1 IMPLANT
EVACUATOR 3/16  PVC DRAIN (DRAIN) ×2
EVACUATOR 3/16 PVC DRAIN (DRAIN) ×1 IMPLANT
GAUZE SPONGE 4X4 12PLY STRL (GAUZE/BANDAGES/DRESSINGS) ×3 IMPLANT
GAUZE SPONGE 4X4 16PLY XRAY LF (GAUZE/BANDAGES/DRESSINGS) IMPLANT
GLOVE BIO SURGEON STRL SZ 6.5 (GLOVE) ×6 IMPLANT
GLOVE BIO SURGEONS STRL SZ 6.5 (GLOVE) ×3
GLOVE BIOGEL M 8.0 STRL (GLOVE) ×3 IMPLANT
GLOVE BIOGEL PI IND STRL 7.0 (GLOVE) ×2 IMPLANT
GLOVE BIOGEL PI IND STRL 8.5 (GLOVE) ×1 IMPLANT
GLOVE BIOGEL PI INDICATOR 7.0 (GLOVE) ×4
GLOVE BIOGEL PI INDICATOR 8.5 (GLOVE) ×2
GLOVE ECLIPSE 8.5 STRL (GLOVE) ×3 IMPLANT
GLOVE EXAM NITRILE LRG STRL (GLOVE) IMPLANT
GLOVE EXAM NITRILE MD LF STRL (GLOVE) IMPLANT
GLOVE EXAM NITRILE XL STR (GLOVE) IMPLANT
GLOVE EXAM NITRILE XS STR PU (GLOVE) IMPLANT
GOWN STRL REUS W/ TWL LRG LVL3 (GOWN DISPOSABLE) ×3 IMPLANT
GOWN STRL REUS W/ TWL XL LVL3 (GOWN DISPOSABLE) IMPLANT
GOWN STRL REUS W/TWL 2XL LVL3 (GOWN DISPOSABLE) ×3 IMPLANT
GOWN STRL REUS W/TWL LRG LVL3 (GOWN DISPOSABLE) ×6
GOWN STRL REUS W/TWL XL LVL3 (GOWN DISPOSABLE)
HALF-PLATE CAUDAL TI 8MM (Plate) ×2 IMPLANT
HEMOSTAT POWDER KIT SURGIFOAM (HEMOSTASIS) ×6 IMPLANT
KIT BASIN OR (CUSTOM PROCEDURE TRAY) ×3 IMPLANT
KIT ROOM TURNOVER OR (KITS) ×3 IMPLANT
NEEDLE SPNL 22GX3.5 QUINCKE BK (NEEDLE) ×3 IMPLANT
NS IRRIG 1000ML POUR BTL (IV SOLUTION) ×3 IMPLANT
PACK LAMINECTOMY NEURO (CUSTOM PROCEDURE TRAY) ×3 IMPLANT
PATTIES SURGICAL .5 X1 (DISPOSABLE) ×3 IMPLANT
PEEK ACDF 5DEG 13X15X8MM (Peek) ×3 IMPLANT
PUTTY BONE GRAFT KIT 2.5ML (Bone Implant) ×3 IMPLANT
RUBBERBAND STERILE (MISCELLANEOUS) ×6 IMPLANT
SCREW S/D FIXED TI 3.5X14MM G2 (Screw) ×9 IMPLANT
SCREW S/T FIXED TI 3.5X14MM G2 (Screw) ×3 IMPLANT
SPONGE INTESTINAL PEANUT (DISPOSABLE) ×6 IMPLANT
SPONGE SURGIFOAM ABS GEL SZ50 (HEMOSTASIS) ×3 IMPLANT
STRIP CLOSURE SKIN 1/2X4 (GAUZE/BANDAGES/DRESSINGS) ×2 IMPLANT
SUT VIC AB 3-0 SH 8-18 (SUTURE) ×3 IMPLANT
SWAB COLLECTION DEVICE MRSA (MISCELLANEOUS) ×3 IMPLANT
SWAB CULTURE ESWAB REG 1ML (MISCELLANEOUS) ×3 IMPLANT
TAPE CLOTH SURG 4X10 WHT LF (GAUZE/BANDAGES/DRESSINGS) ×3 IMPLANT
TOWEL OR 17X24 6PK STRL BLUE (TOWEL DISPOSABLE) ×3 IMPLANT
TOWEL OR 17X26 10 PK STRL BLUE (TOWEL DISPOSABLE) ×3 IMPLANT
WATER STERILE IRR 1000ML POUR (IV SOLUTION) ×3 IMPLANT

## 2015-11-29 NOTE — H&P (Signed)
Corey Gilbert is an 50 y.o. male.   Chief Complaint: left arm pain HPI: patient who underwent an ACDF at c6-7 on 11/13/15. Did well but 10 days postop he developed left arm pain. He was seen in my office this week . He came with no brace Lateral c-spine showed midline plate. No routine ap was done. Sent today to have an epidural injection and it was found that the plate was off center to the left. Patient has a bipolar disorder and he is a Radio producerprofesional martial arts instructor. He denies any fall or accident. i did call him today at 530 pm and he is being taken to surgery right now .   Past Medical History  Diagnosis Date  . Near syncope     due to opain, last time  early Sept 2013  . Depression   . Concussion   . Kidney stones   . Arthritis   . Family history of anesthesia complication     pon& v  . Bipolar disorder (HCC)   . Anxiety   . Headache   . Hepatitis     Hepatitis C - has been treated with Harvoni  . Complication of anesthesia   . PONV (postoperative nausea and vomiting)     not on most recent surgery    Past Surgical History  Procedure Laterality Date  . [facility]  . Wrist surgery  2000    right  . Lumbar laminectomy/decompression microdiscectomy  06/14/2012    Procedure: LUMBAR LAMINECTOMY/DECOMPRESSION MICRODISCECTOMY 1 LEVEL;  Surgeon: Karn CassisErnesto M Kimyah Frein, MD;  Location: [facility];  Service: Neurosurgery;  Laterality: Left;  Left Lumbar Removal of Fragment/Laminectomy Lumbar Three-Four  . [facility]  . Sp arthro thumb*l*  2013  left  2000 right    bilateral tendon  release.   Marland Kitchen. Anterior cervical decomp/discectomy fusion  09/27/2012    Procedure: ANTERIOR CERVICAL DECOMPRESSION/DISCECTOMY FUSION 2 LEVELS;  Surgeon: Karn CassisErnesto M Kaarin Pardy, MD;  Location: [facility];  Service: Neurosurgery;  Laterality: N/A;  Cervical four-five, Cervical five-six,  Anterior cervical decompression/diskectomy/fusion/plate  . Lumbar wound debridement N/A  02/16/2015    Procedure: LUMBAR WOUND DEBRIDEMENT;  Surgeon: Coletta MemosKyle Cabbell, MD;  Location: [facility];  Service: Neurosurgery;  Laterality: N/A;  . Lithotripsy    . Anterior cervical decomp/discectomy fusion N/A 11/13/2015    Procedure: C6-7 Anterior cervical decompression/diskectomy/fusion;  Surgeon: Hilda LiasErnesto Falicity Sheets, MD;  Location: [facility];  Service: Neurosurgery;  Laterality: N/A;  C6-7 Anterior cervical decompression/diskectomy/fusion    Family History  Problem Relation Age of Onset  . Arrhythmia Mother    Social History:  reports that he has never smoked. He has never used smokeless tobacco. He reports that he uses illicit drugs (Marijuana and Oxycodone). He reports that he does not drink alcohol.  Allergies:  Allergies  Allergen Reactions  . Acetaminophen Other (See Comments)    Avoids due to history of Hepatitis C  . Baclofen Other (See Comments)    Goes manic  . Gabapentin Other (See Comments)    Goes manic  . Ibuprofen Other (See Comments)    Goes manic  . Lyrica [Pregabalin] Other (See Comments)    Mania  . Toradol [Ketorolac Tromethamine] Other (See Comments)    Reacts with other medications; also, fever and mania  . Other Other (See Comments)    Antidepressants     Medications Prior to Admission  Medication  Sig Dispense Refill  . aspirin-acetaminophen-caffeine (EXCEDRIN MIGRAINE) 250-250-65 MG per tablet Take 1 tablet by mouth every 6 (six) hours as needed for migraine.     . cyclobenzaprine (FLEXERIL) 10 MG tablet Take 10 mg by mouth every 8 (eight) hours as needed (pain).   0  . Oxycodone HCl 10 MG TABS Take 10 mg by mouth 4 (four) times daily as needed (pain).      No results found for this or any previous visit (from the past 48 hour(s)). Dg Epidurography  11/29/2015  CLINICAL DATA:  Cervical spondylosis without myelopathy. LEFT arm pain. FLUOROSCOPY TIME:  28 seconds corresponding to a Dose Area Product of 24.35 Gy*m2 PROCEDURE: Informed written consent  was obtained.  Time-out was performed. CERVICAL EPIDURAL INJECTION An interlaminar approach was performed on the LEFT at C7-T1 . A 20 gauge Crawford epidural needle was advanced using loss-of-resistance technique. DIAGNOSTIC EPIDURAL INJECTION Injection of Isovue-M 300 shows a good epidural pattern with spread above and below the level of needle placement, primarily on the LEFT. No vascular opacification is seen. THERAPEUTIC EPIDURAL INJECTION 1.5 ml of Kenalog 40 mixed with 1 ml of 1% Lidocaine and 2 ml of normal saline were then instilled. The procedure was well-tolerated, and the patient was discharged thirty minutes following the injection in good condition. IMPRESSION: Technically successful first epidural injection on the LEFT at C7-T1. In review of the most recent plain films from [facility], dated 11/27/2015, the recently placed C6-7 fusion construct screws appear to partially enter the C6-7 and C7-T1 disc spaces. Recommend noncontrast CT of the cervical spine for further evaluation, given the laterally positioned plate to the LEFT of midline on the AP spot film today. Electronically Signed   By: Elsie Stain M.D.   On: 11/29/2015 12:53    Review of Systems  Constitutional: Negative.   Respiratory: Negative.   Cardiovascular: Negative.   Genitourinary: Negative.   Musculoskeletal: Positive for neck pain.  Skin: Negative.   Neurological: Negative.   Endo/Heme/Allergies: Negative.   Psychiatric/Behavioral:       Bipolar disorder    There were no vitals taken for this visit. Physical Exam hent, no. Neck resolving soft tissue hematoma. Cv, nl. Lungs, clear. Abdomen, soft. Extremities, nl. NEURO normal  Assessment/Plan revision of cervical plte. He and his wife are aware of the procedure, risks and benefits  Karn Cassis, MD 11/29/2015, 7:02 PM

## 2015-11-29 NOTE — Discharge Instructions (Signed)

## 2015-11-29 NOTE — Op Note (Signed)
[name] Kitchen:  Corey Gilbert, Corey                ACCOUNT NO.:  [id number]  MEDICAL RECORD NO.:  [id number]  LOCATION:  [facility]                         FACILITY:  [facility]  PHYSICIAN:  Hilda LiasErnesto Euel Castile, M.D.   DATE OF BIRTH:  1966/06/22  DATE OF PROCEDURE:  11/29/2015 DATE OF DISCHARGE:                              OPERATIVE REPORT   PREOPERATIVE DIAGNOSES:  Left side cervical radiculopathy.  Status post fusion, C6-C7 about 3 weeks ago.  Previous fusion, C4-5 and C5-6.  POSTOPERATIVE DIAGNOSES:  Left side cervical radiculopathy.  Status post fusion, C6-C7 about 3 weeks ago.  Previous fusion, C4-5 and C5-6.  PROCEDURES:  Removal of C6-7 plate.  Foraminotomy.  Insertion of Zimmer 8-mm half plate with an EquivaBone.  Microscope.  SURGEON:  Hilda LiasErnesto Asuncion Shibata, M.D.  ASSISTANT:  Stefani DamaHenry J. Elsner, M.D.  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  About 3 weeks ago, he had a fusion at the level of C6-7.  The patient did well.  He developed superficial hematoma.  He was seen by me in my office.  He was doing really well.  He called complaining of worsening of the pain going to the left upper extremity about 10 days after surgery.  He denies practicing his martial arts or any trauma.  We did a cervical epidural today and we found that the plate was completely displaced into the left soft tissue.  Because of that, I called him and brought him to the hospital immediately for surgery.  He has a history of bipolar disorder.  He is not on any medication.  I talked to him and his wife.  We decided to take him to Surgery tonight and to minimize the pain that he is having.  He has no weakness whatsoever and no problem of swallowing.  DESCRIPTION OF PROCEDURE:  The patient was taken to the OR, and after intubation, the left side of the neck was cleaned with ChloraPrep. Drapes were applied.  Incision was made following  the previous one. What we found immediately that this patient has a quite a bit of fibrosis only 3 weeks after surgery.  There was some yellowish fluid and the specimen was sent immediately to the Laboratory.  The difficult part of the surgery was dissecting the soft tissue to prevent any damage to the carotid artery or to the esophagus.  To reach the area of the cervical spine where he had the surgery, it took us at least 40 minutes. Then, retractors were inserted.  Immediately, we saw that the plate was almost in the soft tissue.  Removal was made easy.  We brought the microscope into the area and we found that the bone graft had been broken in 6-7 pieces.  Removal was done.  The patient had quite a bit of fibrosis.  We decompressed the spinal cord in the midline again, removed the fibrosis and we went laterally into the foramen.  Decompression was achieved again.  Then, the endplates were drilled.  We decided to proceed with insertion of a half plate Zimmer plate using EquivaBone. The  plate was kept in place using one screw at the level of C6 and two screw at the level of C7.  Because of the size of the patient and because in the past, we were unable to see only the lower part of 4-5, we proceeded with irrigation of the area.  At this time, I left a large Hemovac into the operative site.  The area was irrigated.  Hemostasis was accomplished with bipolar and the wound was closed with Vicryl and Strips to the skin.  At the end of the procedure, we did a lateral cervical spine x-ray.  The bone laterally looks great. The plate was little bit off to the left, but nevertheless, the screws were incorporated into the bone itself.  The patient woke up, moving all the four extremities.  He is going to be kept for at least 2-3 days and he will be having intravenous morphine.          ______________________________ Hilda Lias, M.D.     EB/MEDQ  D:  11/29/2015  T:  11/29/2015  Job:   [id number]

## 2015-11-29 NOTE — Progress Notes (Signed)
D; dysurination, bladder scan done, residual>24700ml on the screen   f-4516fr inserted,  650cc urine obtained. Leave f- cath in.

## 2015-11-29 NOTE — Anesthesia Procedure Notes (Signed)
Procedure Name: Intubation Date/Time: 11/29/2015 7:20 PM Performed by: Eligha Bridegroom Pre-anesthesia Checklist: Patient identified, Emergency Drugs available, Suction available, Patient being monitored and Timeout performed Patient Re-evaluated:Patient Re-evaluated prior to inductionOxygen Delivery Method: Circle system utilized Preoxygenation: Pre-oxygenation with 100% oxygen Intubation Type: IV induction Laryngoscope Size: Mac and 4 Grade View: Grade I Tube type: Oral Tube size: 7.5 mm Number of attempts: 1 Airway Equipment and Method: LTA kit utilized Placement Confirmation: ETT inserted through vocal cords under direct vision,  breath sounds checked- equal and bilateral and positive ETCO2 Secured at: 22 cm Tube secured with: Tape Dental Injury: Teeth and Oropharynx as per pre-operative assessment

## 2015-11-29 NOTE — Anesthesia Preprocedure Evaluation (Signed)
Anesthesia Evaluation  Patient identified by MRN, date of birth, ID band Patient awake    Reviewed: Allergy & Precautions, NPO status , Patient's Chart, lab work & pertinent test results  History of Anesthesia Complications (+) PONV  Airway Mallampati: II   Neck ROM: full    Dental   Pulmonary shortness of breath,    breath sounds clear to auscultation       Cardiovascular hypertension,  Rhythm:regular Rate:Normal  Poorly controlled HTN   Neuro/Psych  Headaches, Anxiety Depression Bipolar Disorder    GI/Hepatic (+) Hepatitis -, C  Endo/Other    Renal/GU      Musculoskeletal  (+) Arthritis ,   Abdominal   Peds  Hematology   Anesthesia Other Findings   Reproductive/Obstetrics                             Anesthesia Physical Anesthesia Plan  ASA: II  Anesthesia Plan: General   Post-op Pain Management:    Induction: Intravenous  Airway Management Planned: Oral ETT  Additional Equipment:   Intra-op Plan:   Post-operative Plan: Extubation in OR  Informed Consent: I have reviewed the patients History and Physical, chart, labs and discussed the procedure including the risks, benefits and alternatives for the proposed anesthesia with the patient or authorized representative who has indicated his/her understanding and acceptance.     Plan Discussed with: CRNA, Anesthesiologist and Surgeon  Anesthesia Plan Comments:         Anesthesia Quick Evaluation

## 2015-11-29 NOTE — Transfer of Care (Signed)
Immediate Anesthesia Transfer of Care Note  Patient: Corey Gilbert  Procedure(s) Performed: Procedure(s) with comments: Removal of anterior cervical plate and Insertion of Half-Plate Caudal (N/A) - Removal of anterior cervical plate and Insertion of Half-Plate Caudal  Patient Location: PACU  Anesthesia Type:General  Level of Consciousness: awake, alert  and oriented  Airway & Oxygen Therapy: Patient connected to nasal cannula oxygen  Post-op Assessment: Report given to RN, Post -op Vital signs reviewed and stable and Patient moving all extremities X 4  Post vital signs: Reviewed and stable  Last Vitals: There were no vitals filed for this visit.  Complications: No apparent anesthesia complications

## 2015-11-30 DIAGNOSIS — IMO0001 Reserved for inherently not codable concepts without codable children: Secondary | ICD-10-CM | POA: Diagnosis present

## 2015-11-30 DIAGNOSIS — M4802 Spinal stenosis, cervical region: Secondary | ICD-10-CM

## 2015-11-30 DIAGNOSIS — F419 Anxiety disorder, unspecified: Secondary | ICD-10-CM

## 2015-11-30 DIAGNOSIS — R030 Elevated blood-pressure reading, without diagnosis of hypertension: Secondary | ICD-10-CM

## 2015-11-30 LAB — CBC WITH DIFFERENTIAL/PLATELET
BASOS PCT: 0 %
Basophils Absolute: 0 10*3/uL (ref 0.0–0.1)
Eosinophils Absolute: 0.1 10*3/uL (ref 0.0–0.7)
Eosinophils Relative: 1 %
HEMATOCRIT: 37 % — AB (ref 39.0–52.0)
HEMOGLOBIN: 12.7 g/dL — AB (ref 13.0–17.0)
LYMPHS ABS: 2 10*3/uL (ref 0.7–4.0)
Lymphocytes Relative: 17 %
MCH: 29.5 pg (ref 26.0–34.0)
MCHC: 34.3 g/dL (ref 30.0–36.0)
MCV: 86 fL (ref 78.0–100.0)
MONOS PCT: 6 %
Monocytes Absolute: 0.7 10*3/uL (ref 0.1–1.0)
NEUTROS ABS: 8.9 10*3/uL — AB (ref 1.7–7.7)
NEUTROS PCT: 76 %
Platelets: 165 10*3/uL (ref 150–400)
RBC: 4.3 MIL/uL (ref 4.22–5.81)
RDW: 12.8 % (ref 11.5–15.5)
WBC: 11.7 10*3/uL — ABNORMAL HIGH (ref 4.0–10.5)

## 2015-11-30 LAB — COMPREHENSIVE METABOLIC PANEL
ALBUMIN: 3.3 g/dL — AB (ref 3.5–5.0)
ALK PHOS: 56 U/L (ref 38–126)
ALT: 18 U/L (ref 17–63)
ANION GAP: 12 (ref 5–15)
AST: 26 U/L (ref 15–41)
BILIRUBIN TOTAL: 0.2 mg/dL — AB (ref 0.3–1.2)
BUN: 10 mg/dL (ref 6–20)
CALCIUM: 9.2 mg/dL (ref 8.9–10.3)
CO2: 27 mmol/L (ref 22–32)
CREATININE: 0.93 mg/dL (ref 0.61–1.24)
Chloride: 99 mmol/L — ABNORMAL LOW (ref 101–111)
GFR calc Af Amer: >60 mL/min (ref >60)
GFR calc non Af Amer: >60 mL/min (ref >60)
GLUCOSE: 143 mg/dL — AB (ref 65–99)
Potassium: 3.6 mmol/L (ref 3.5–5.1)
Sodium: 138 mmol/L (ref 135–145)
TOTAL PROTEIN: 6.6 g/dL (ref 6.5–8.1)

## 2015-11-30 MED ORDER — ZOLPIDEM TARTRATE 5 MG PO TABS: 10.0000 mg | ORAL_TABLET | Freq: Every evening | ORAL | Status: DC | PRN

## 2015-11-30 MED ORDER — AMLODIPINE BESYLATE 5 MG PO TABS
5.0000 mg | ORAL_TABLET | Freq: Every day | ORAL | Status: DC
  Administered 2015-11-30: 5 mg via ORAL
  Filled 2015-11-30 (×2): qty 1

## 2015-11-30 MED ORDER — SODIUM CHLORIDE 0.9% FLUSH: 3.0000 mL | INTRAVENOUS | Status: DC | PRN

## 2015-11-30 MED ORDER — ONDANSETRON HCL 4 MG/2ML IJ SOLN: 4.0000 mg | INTRAMUSCULAR | Status: DC | PRN

## 2015-11-30 MED ORDER — HYDRALAZINE HCL 20 MG/ML IJ SOLN
5.0000 mg | INTRAMUSCULAR | Status: DC | PRN
  Filled 2015-11-30: qty 0.25

## 2015-11-30 MED ORDER — SENNA 8.6 MG PO TABS
1.0000 | ORAL_TABLET | Freq: Two times a day (BID) | ORAL | Status: DC
  Administered 2015-11-30 (×2): 8.6 mg via ORAL
  Filled 2015-11-30 (×2): qty 1

## 2015-11-30 MED ORDER — HYDROMORPHONE HCL 1 MG/ML IJ SOLN
0.5000 mg | INTRAMUSCULAR | Status: DC | PRN
  Administered 2015-11-30: 0.5 mg via INTRAVENOUS
  Filled 2015-11-30: qty 1

## 2015-11-30 MED ORDER — CARVEDILOL 6.25 MG PO TABS
6.2500 mg | ORAL_TABLET | Freq: Two times a day (BID) | ORAL | Status: DC
  Administered 2015-11-30: 6.25 mg via ORAL
  Filled 2015-11-30: qty 1

## 2015-11-30 MED ORDER — OXYCODONE HCL 10 MG PO TABS: 10.0000 mg | ORAL_TABLET | ORAL | Status: DC | PRN

## 2015-11-30 MED ORDER — AMLODIPINE BESYLATE 10 MG PO TABS: 10.0000 mg | ORAL_TABLET | Freq: Every day | ORAL | Status: DC

## 2015-11-30 MED ORDER — HYDRALAZINE HCL 20 MG/ML IJ SOLN: 5.0000 mg | INTRAMUSCULAR | Status: DC | PRN

## 2015-11-30 MED ORDER — HYDRALAZINE HCL 20 MG/ML IJ SOLN: 10.0000 mg | INTRAMUSCULAR | Status: DC | PRN

## 2015-11-30 MED ORDER — SODIUM CHLORIDE 0.9% FLUSH
3.0000 mL | Freq: Two times a day (BID) | INTRAVENOUS | Status: DC
  Administered 2015-11-30: 3 mL via INTRAVENOUS

## 2015-11-30 MED ORDER — SODIUM CHLORIDE 0.9 % IV SOLN: 250.0000 mL | INTRAVENOUS | Status: DC

## 2015-11-30 NOTE — Progress Notes (Signed)
Discharge instructions given to patient and wife. Encouraged patient to wear aspen collar at all times. Patient stated he was to wear for 2 weeks. Instructed patient to schedule a follow-up appointment with Dr. Jeral FruitBotero. He stated the office would call him to set it up. Patient discharged home with wife. Corey CrankerAnita Priddy, RN

## 2015-11-30 NOTE — Evaluation (Signed)
Physical Therapy Evaluation Patient Details Name: Randon Goldsmithlan Rossini MRN: [id number] DOB: 10-31-1965 Today's Date: 11/30/2015   History of Present Illness  50 y.o. male with PMH of depression, anxiety, bipolar disorder, hepatitis, headache, syncope, who was admitted by neurosurgeon, Dr. Jeral FruitBotero. Patient had an ACDF at c6-7 on 11/13/15. He did well, but 10 days postop he developed left arm pain. He was found to have the cervical plate off center to the left. He underwent revision of cervical plate by Dr. Jeral FruitBotero on 11/29/2015.  Clinical Impression  Patient seen for mobility assessment and home education. Patient receptive to precautions, educated regarding activity and cervical restrictions. Patient mobilizing independently. No further acute PT needs, will sign off.    Follow Up Recommendations No PT follow up    Equipment Recommendations  None recommended by PT    Recommendations for Other Services       Precautions / Restrictions Precautions Precautions: Cervical Precaution Comments: Educated pt and wife on cervical precautions and provided handout. Required Braces or Orthoses: Cervical Brace Cervical Brace: Hard collar;At all times Restrictions Weight Bearing Restrictions: No      Mobility  Bed Mobility Overal bed mobility: Modified Independent                Transfers Overall transfer level: Independent Equipment used: None                Ambulation/Gait Ambulation/Gait assistance: Independent Ambulation Distance (Feet): 300 Feet Assistive device: None     Gait velocity interpretation: at or above normal speed for age/gender General Gait Details: Steady gait  Stairs Stairs: Yes Stairs assistance: Modified independent (Device/Increase time) Stair Management: No rails Number of Stairs: 3    Wheelchair Mobility    Modified Rankin (Stroke Patients Only)       Balance Overall balance assessment: Needs assistance                                            Pertinent Vitals/Pain Pain Assessment: 0-10 Pain Score: 10-Worst pain ever Pain Location: neck Pain Descriptors / Indicators: Aching Pain Intervention(s): Monitored during session    Home Living Family/patient expects to be discharged to:: Private residence Living Arrangements: Spouse/significant other;Parent Available Help at Discharge: Family;Available 24 hours/day Type of Home: House Home Access: Level entry     Home Layout: Two level;Bed/bath upstairs Home Equipment: Cane - single point;Grab bars - tub/shower      Prior Function Level of Independence: Independent               Hand Dominance   Dominant Hand: Right    Extremity/Trunk Assessment   Upper Extremity Assessment: Overall WFL for tasks assessed           Lower Extremity Assessment: Overall WFL for tasks assessed      Cervical / Trunk Assessment: Other exceptions  Communication   Communication: No difficulties  Cognition Arousal/Alertness: Awake/alert Behavior During Therapy: Impulsive Overall Cognitive Status: Within Functional Limits for tasks assessed                      General Comments      Exercises        Assessment/Plan    PT Assessment Patent does not need any further PT services  PT Diagnosis Difficulty walking;Acute pain   PT Problem List    PT Treatment Interventions  PT Goals (Current goals can be found in the Care Plan section) Acute Rehab PT Goals Patient Stated Goal: return home PT Goal Formulation: All assessment and education complete, DC therapy    Frequency     Barriers to discharge        Co-evaluation               End of Session Equipment Utilized During Treatment: Cervical collar Activity Tolerance: Patient tolerated treatment well Patient left: in bed;with call bell/phone within reach;with family/visitor present Nurse Communication: Mobility status         Time: 4098-1191 PT Time Calculation (min)  (ACUTE ONLY): 15 min   Charges:   PT Evaluation $PT Eval Low Complexity: 1 Procedure     PT G CodesFabio Asa 2015/12/22, 1:32 PM Charlotte Crumb, PT DPT  [phone]

## 2015-11-30 NOTE — Progress Notes (Signed)
D;, hospitalist MD called, Information given including MD's phone number

## 2015-11-30 NOTE — Evaluation (Signed)
Occupational Therapy Evaluation Patient Details Name: Corey Gilbert MRN: [id number] DOB: 08/16/1966 Today's Date: 11/30/2015    History of Present Illness 50 y.o. male with PMH of depression, anxiety, bipolar disorder, hepatitis, headache, syncope, who was admitted by neurosurgeon, Dr. Jeral Fruit. Patient had an ACDF at c6-7 on 11/13/15. He did well, but 10 days postop he developed left arm pain. He was found to have the cervical plate off center to the left. He underwent revision of cervical plate by Dr. Jeral Fruit on 11/29/2015.   Clinical Impression   Pt continues to be impulsive with functional mobility and ADL activities. Educated pt and wife on cervical precautions (provided handout) and importance of maintaining precautions during functional activities. Discussed importance of wearing hard cervical collar at all times. Pt currently supervision for safety with ADLs secondary to impulsivity and decreased ability to maintain precautions. Pt planning to d/c home with 24/7 supervision from family. No further acute OT needs identified; signing off at this time. Please re-consult if needs change. Thank you for this referral.     Follow Up Recommendations  No OT follow up;Supervision - Intermittent    Equipment Recommendations  None recommended by OT    Recommendations for Other Services       Precautions / Restrictions Precautions Precautions: Cervical Precaution Comments: Educated pt and wife on cervical precautions and provided handout. Required Braces or Orthoses: Cervical Brace Cervical Brace: Hard collar;At all times Restrictions Weight Bearing Restrictions: No      Mobility Bed Mobility Overal bed mobility: Modified Independent                Transfers Overall transfer level: Independent Equipment used: None                  Balance Overall balance assessment: No apparent balance deficits (not formally assessed)                                           ADL Overall ADL's : Needs assistance/impaired                                     Functional mobility during ADLs: Independent General ADL Comments: Pt continues to be impulsive with ADLs and functional mobility and requires supervision for safety and to maintain precautions during functional activities; would otherwise be mod I. Reviewed cervical precautions in relation to ADL/IADL activities and provided handout. Discussed importance of maintaining precautions and wearing brace at all times. Pt reports L UE pain/decreased sensation has resolved.      Vision     Perception     Praxis      Pertinent Vitals/Pain Pain Assessment: 0-10 Pain Score: 10-Worst pain ever Pain Location: neck Pain Descriptors / Indicators: Aching Pain Intervention(s): Monitored during session;PCA encouraged     Hand Dominance Right   Extremity/Trunk Assessment Upper Extremity Assessment Upper Extremity Assessment: Defer to OT evaluation   Lower Extremity Assessment Lower Extremity Assessment: Defer to PT evaluation   Cervical / Trunk Assessment Cervical / Trunk Assessment: Other exceptions Cervical / Trunk Exceptions: hx of lumbar sx, s/p cervical sx   Communication Communication Communication: No difficulties   Cognition Arousal/Alertness: Awake/alert Behavior During Therapy: Impulsive Overall Cognitive Status: Within Functional Limits for tasks assessed  General Comments       Exercises       Shoulder Instructions      Home Living Family/patient expects to be discharged to:: Private residence Living Arrangements: Spouse/significant other;Parent Available Help at Discharge: Family;Available 24 hours/day Type of Home: House Home Access: Level entry     Home Layout: Two level;Bed/bath upstairs Alternate Level Stairs-Number of Steps: flight Alternate Level Stairs-Rails: Left Bathroom Shower/Tub: Tub/shower unit Shower/tub  characteristics: Engineer, building servicesCurtain Bathroom Toilet: Standard     Home Equipment: Cane - single point;Grab bars - tub/shower          Prior Functioning/Environment Level of Independence: Independent             OT Diagnosis: Acute pain   OT Problem List:     OT Treatment/Interventions:      OT Goals(Current goals can be found in the care plan section) Acute Rehab OT Goals Patient Stated Goal: return home OT Goal Formulation: All assessment and education complete, DC therapy  OT Frequency:     Barriers to D/C:            Co-evaluation              End of Session Equipment Utilized During Treatment: Cervical collar  Activity Tolerance: Patient tolerated treatment well Patient left: in bed;with call bell/phone within reach;with family/visitor present   Time: [id number] OT Time Calculation (min): 19 min Charges:  OT General Charges $OT Visit: 1 Procedure OT Evaluation $OT Eval Low Complexity: 1 Procedure G-Codes: OT G-codes **NOT FOR INPATIENT CLASS** Functional Assessment Tool Used: Clinical judgement Functional Limitation: Self care Self Care Current Status (U7253(G8987): At least 1 percent but less than 20 percent impaired, limited or restricted Self Care Goal Status (G6440(G8988): At least 1 percent but less than 20 percent impaired, limited or restricted Self Care Discharge Status [phone](G8989): At least 1 percent but less than 20 percent impaired, limited or restricted   Gaye AlkenBailey A Rupinder Livingston M.S., OTR/L Pager: [phone]  11/30/2015, 8:35 AM

## 2015-11-30 NOTE — Progress Notes (Signed)
Patient ID: Corey Gilbert, male   DOB: 1966/07/21, 50 y.o.   MRN: [id number] Patient doing better arm pain gone soreness and neck stable  Strength 5 out of 5 wound clean dry and intact  DC drain DC home later this afternoon.

## 2015-11-30 NOTE — Consult Note (Addendum)
. [facility] History and Physical Consult note  Corey Gilbert UEA:[id number] DOB: October 19, 1965 DOA: 11/29/2015  Referring physician: ED physician PCP: Pcp Not In System  Specialists:   Brief description of the medical history:  Corey Gilbert is a 50 y.o. male with PMH of depression, anxiety, bipolar disorder, hepatitis, headache, syncope, who was admitted by neurosurgeon, Dr. Jeral Fruit. Patient had an ACDF at c6-7 on 11/13/15. He did well, but 10 days postop he developed left arm pain. He was found to have the cervical plate off center to the left. He underwent revision of cervical plte by dr. Jeral Fruit today. After surgery, he was found to have elevated blood pressure at 191/125. Patient has significant pain over surgical site. He does not have chest pain, shortness breath, unilateral weakness, abdominal pain, nausea, vomiting, diarrhea, cough, shortness of breath, symptoms of UTI. Patient reports that he does not have diagnosis of hypertension, but states that his blood pressure has been fluctuating in the past. We are asked to consult on his elevated blood pressure. Neurosurgeon is primary team.  Where does patient live?   At home    Can patient participate in ADLs?  Yes   Review of Systems:   General: no fevers, chills, no changes in body weight, has fatigue HEENT: no blurry vision, hearing changes or sore throat Pulm: no dyspnea, coughing, wheezing CV: no chest pain, no palpitations Abd: no nausea, vomiting, abdominal pain, diarrhea, constipation GU: no dysuria, burning on urination, increased urinary frequency, hematuria  Ext: no leg edema Neuro: has neck pain Skin: no rash MSK: No muscle spasm, no deformity, no limitation of range of movement in spin Heme: No easy bruising.  Travel history: No recent long distant travel.  Allergy:  Allergies  Allergen Reactions  . Acetaminophen Other (See Comments)    Avoids due to history of Hepatitis C  . Baclofen Other (See Comments)     Goes manic  . Gabapentin Other (See Comments)    Goes manic  . Ibuprofen Other (See Comments)    Goes manic  . Lyrica [Pregabalin] Other (See Comments)    Mania  . Toradol [Ketorolac Tromethamine] Other (See Comments)    Reacts with other medications; also, fever and mania  . Other Other (See Comments)    Antidepressants     Past Medical History  Diagnosis Date  . Near syncope     due to opain, last time  early Sept 2013  . Depression   . Concussion   . Kidney stones   . Arthritis   . Family history of anesthesia complication     pon& v  . Bipolar disorder (HCC)   . Anxiety   . Headache   . Hepatitis     Hepatitis C - has been treated with Harvoni  . Complication of anesthesia   . PONV (postoperative nausea and vomiting)     not on most recent surgery    Past Surgical History  Procedure Laterality Date  . Wrist surgery  2013    left at [facility]  . Wrist surgery  2000    right  . Lumbar laminectomy/decompression microdiscectomy  06/14/2012    Procedure: LUMBAR LAMINECTOMY/DECOMPRESSION MICRODISCECTOMY 1 LEVEL;  Surgeon: Karn Cassis, MD;  Location: [facility];  Service: Neurosurgery;  Laterality: Left;  Left Lumbar Removal of Fragment/Laminectomy Lumbar Three-Four  . Back surgery  2013    at [facility]  . Sp arthro thumb*l*  2013  left  2000 right    bilateral tendon  release.   Marland Kitchen. Anterior cervical decomp/discectomy fusion  09/27/2012    Procedure: ANTERIOR CERVICAL DECOMPRESSION/DISCECTOMY FUSION 2 LEVELS;  Surgeon: Karn CassisErnesto M Botero, MD;  Location: [facility];  Service: Neurosurgery;  Laterality: N/A;  Cervical four-five, Cervical five-six,  Anterior cervical decompression/diskectomy/fusion/plate  . Lumbar wound debridement N/A 02/16/2015    Procedure: LUMBAR WOUND DEBRIDEMENT;  Surgeon: Coletta MemosKyle Cabbell, MD;  Location: [facility];  Service: Neurosurgery;  Laterality: N/A;  . Lithotripsy    . Anterior cervical decomp/discectomy fusion N/A 11/13/2015    Procedure: C6-7  Anterior cervical decompression/diskectomy/fusion;  Surgeon: Hilda LiasErnesto Botero, MD;  Location: [facility];  Service: Neurosurgery;  Laterality: N/A;  C6-7 Anterior cervical decompression/diskectomy/fusion    Social History:  reports that he has never smoked. He has never used smokeless tobacco. He reports that he uses illicit drugs (Marijuana and Oxycodone). He reports that he does not drink alcohol.  Family History:  Family History  Problem Relation Age of Onset  . Arrhythmia Mother      Prior to Admission medications   Medication Sig Start Date End Date Taking? Authorizing Provider  aspirin-acetaminophen-caffeine (EXCEDRIN MIGRAINE) [phone] MG per tablet Take 1 tablet by mouth every 6 (six) hours as needed for migraine.     Historical Provider, MD  cyclobenzaprine (FLEXERIL) 10 MG tablet Take 10 mg by mouth every 8 (eight) hours as needed (pain).  01/28/15   Historical Provider, MD  Oxycodone HCl 10 MG TABS Take 10 mg by mouth 4 (four) times daily as needed (pain).    Historical Provider, MD    Physical Exam: Filed Vitals:   11/29/15 2345 11/30/15 0000 11/30/15 0040 11/30/15 0111  BP: 174/126 185/126 171/124 176/130  Pulse: 75 74 73   Temp:  98 F (36.7 C) 98.2 F (36.8 C)   Resp: 15 12 12    SpO2: 98% 98% 95%    General: Not in acute distress HEENT:       Eyes: PERRL, EOMI, no scleral icterus.       ENT: No discharge from the ears and nose, no pharynx injection, no tonsillar enlargement.        Neck: No JVD, no bruit, no mass felt. Heme: No neck lymph node enlargement. Cardiac: S1/S2, RRR, No murmurs, No gallops or rubs. Pulm: No rales, wheezing, rhonchi or rubs. Abd: Soft, nondistended, nontender, no rebound pain, no organomegaly, BS present. Ext: No pitting leg edema bilaterally. 2+DP/PT pulse bilaterally. Musculoskeletal: has c-collar in place. Has neck pain. Skin: No rashes.  Neuro: Alert, oriented X3, cranial nerves II-XII grossly intact, moves all extremities  normally.  Psych: Patient is not psychotic, no suicidal or hemocidal ideation.  Labs on Admission:  Basic Metabolic Panel: No results for input(s): NA, K, CL, CO2, GLUCOSE, BUN, CREATININE, CALCIUM, MG, PHOS in the last 168 hours. Liver Function Tests: No results for input(s): AST, ALT, ALKPHOS, BILITOT, PROT, ALBUMIN in the last 168 hours. No results for input(s): LIPASE, AMYLASE in the last 168 hours. No results for input(s): AMMONIA in the last 168 hours. CBC:  Recent Labs Lab 11/30/15 0148  WBC 11.7*  NEUTROABS 8.9*  HGB 12.7*  HCT 37.0*  MCV 86.0  PLT 165   Cardiac Enzymes: No results for input(s): CKTOTAL, CKMB, CKMBINDEX, TROPONINI in the last 168 hours.  BNP (last 3 results) No results for input(s): BNP in the last 8760 hours.  ProBNP (last 3 results) No results for input(s): PROBNP in the last 8760 hours.  CBG: No results for input(s): GLUCAP  in the last 168 hours.  Radiological Exams on Admission: Dg Cervical Spine 2-3 Views  11/29/2015  CLINICAL DATA:  ACDF hardware removal and implant of caudal half plate EXAM: DG C-ARM 61-120 MIN; CERVICAL SPINE - 2-3 VIEW COMPARISON:  Earlier the same day. FINDINGS: Two intraoperative spot fluoro films are submitted. Anterior plate again seen bridging from C4-C6. Although not well seen secondary to overlying shoulders, a second plate is seen caudal to C6. IMPRESSION: Intraoperative evaluation. Electronically Signed   By: Kennith Center M.D.   On: 11/29/2015 21:53   Dg Epidurography  11/29/2015  CLINICAL DATA:  Cervical spondylosis without myelopathy. LEFT arm pain. FLUOROSCOPY TIME:  28 seconds corresponding to a Dose Area Product of 24.35 Gy*m2 PROCEDURE: Informed written consent was obtained.  Time-out was performed. CERVICAL EPIDURAL INJECTION An interlaminar approach was performed on the LEFT at C7-T1 . A 20 gauge Crawford epidural needle was advanced using loss-of-resistance technique. DIAGNOSTIC EPIDURAL INJECTION Injection  of Isovue-M 300 shows a good epidural pattern with spread above and below the level of needle placement, primarily on the LEFT. No vascular opacification is seen. THERAPEUTIC EPIDURAL INJECTION 1.5 ml of Kenalog 40 mixed with 1 ml of 1% Lidocaine and 2 ml of normal saline were then instilled. The procedure was well-tolerated, and the patient was discharged thirty minutes following the injection in good condition. IMPRESSION: Technically successful first epidural injection on the LEFT at C7-T1. In review of the most recent plain films from [facility], dated 11/27/2015, the recently placed C6-7 fusion construct screws appear to partially enter the C6-7 and C7-T1 disc spaces. Recommend noncontrast CT of the cervical spine for further evaluation, given the laterally positioned plate to the LEFT of midline on the AP spot film today. Electronically Signed   By: Elsie Stain M.D.   On: 11/29/2015 12:53   Dg C-arm 1-60 Min  11/29/2015  CLINICAL DATA:  ACDF hardware removal and implant of caudal half plate EXAM: DG C-ARM 61-120 MIN; CERVICAL SPINE - 2-3 VIEW COMPARISON:  Earlier the same day. FINDINGS: Two intraoperative spot fluoro films are submitted. Anterior plate again seen bridging from C4-C6. Although not well seen secondary to overlying shoulders, a second plate is seen caudal to C6. IMPRESSION: Intraoperative evaluation. Electronically Signed   By: Kennith Center M.D.   On: 11/29/2015 21:53    Assessment/Plan Principal Problem:   Cervical stenosis of spinal canal Active Problems:   Affective bipolar disorder (HCC)   Anxiety   Chronic hepatitis C (HCC)   DJD (degenerative joint disease), cervical   Elevated blood pressure  Elevated blood pressure: bp was up to 191/125 mmHg.This is likely due to significant pain, but he may have undiagnosed hypertension. No chest pain or signs of stroke. -will start low dose of amlodipine 5 mg daily now -IV hydralazine when necessary -Check basic lab: CBC and  CMP -monitoring bp closely  Cervical stenosis of spinal canal: s/p of surgery of ACDF at C6-7. Had revision of the cervical plate today. Now has pain. -per neurosurgeon management -pain control  Anxiety:  -On Valium per primary team  Affective bipolar disorder (HCC): stable. No on meds. -monitor closely.  DVT ppx: SCD  Code Status: Full code Family Communication:  Yes, patient's wife  at bed side Disposition Plan: Admitted to inpatient   Date of Service 11/30/2015    Lorretta Harp [facility] Pager [phone]  If 7PM-7AM, please contact night-coverage [email or web address] Password [email or web address] 11/30/2015, 2:26 AM

## 2015-11-30 NOTE — Progress Notes (Signed)
Patient ID: Corey Gilbert, male   DOB: 1966-02-25, 50 y.o.   MRN: [id number] Patient doing well significant improvement and arm pain  Awake alert strength 5 out of 5  Patient expressed 1 ago home however was still on a PCA and Dr. Jeral FruitBotero wanted him to stay an additional day or 2 so we will continue observation until tomorrow possible discharge tomorrow

## 2015-11-30 NOTE — Progress Notes (Signed)
Consult Note                             Patient Demographics:    Corey Gilbert, is a 50 y.o. male, DOB - 02/02/1966, ZOX:[id number]  Admit date - 11/29/2015   Admitting Physician Hilda LiasErnesto Botero, MD  Outpatient Primary MD for the patient is Pcp Not In System  LOS - 1   No chief complaint on file.       Subjective:    Corey Gilbert today has, No headache, No chest pain, No abdominal pain - No Nausea, No new weakness tingling or numbness, No Cough - SOB. Does have neck pain and wants to go home.   Assessment  & Plan :     1.Essential hypertension. Worse due to pain, placed on Norvasc and Coreg along with as needed IV hydralazine. Holding parameters for Coreg. Continue pain control per primary team.  2. C-spine surgery for C-spine stenosis status post surgery of ACDF at C6-7-1 week ago, had revision of cervical plate this admission. We'll defer to primary team neurosurgery, neurology also on board. Currently on PCA for pain control.  3. Anxiety and bipolar disorder. On Valium per primary team, outpatient psych follow-up.  4. History of smoking. Counseled to quit.   DVT Prophylaxis  :   SCDs    Lab Results  Component Value Date   PLT 165 11/30/2015    Inpatient Medications  Scheduled Meds: . [START ON 12/01/2015] amLODipine  10 mg Oral Daily  . carvedilol  6.25 mg Oral BID WC  .  ceFAZolin (ANCEF) IV  1 g Intravenous Q8H  . senna  1 tablet Oral BID   Continuous Infusions:  PRN Meds:.cyclobenzaprine, diazepam, hydrALAZINE, HYDROmorphone (DILAUDID) injection, menthol-cetylpyridinium **OR** phenol, oxyCODONE, zolpidem  Antibiotics  :     Anti-infectives    Start     Dose/Rate Route Frequency Ordered Stop   11/30/15 0300  ceFAZolin (ANCEF) IVPB 1 g/50 mL premix     1 g 100 mL/hr over 30 Minutes  Intravenous Every 8 hours 11/29/15 2344 11/30/15 1859        Objective:   Filed Vitals:   11/30/15 0111 11/30/15 0306 11/30/15 0500 11/30/15 0746  BP: 176/130  158/107   Pulse:   68   Temp:   98.2 F (36.8 C)   Resp:  12 14 11   SpO2:  93% 95% 93%    Wt Readings from Last 3 Encounters:  11/04/15 84.369 kg (186 lb)  11/01/15 84.55 kg (186 lb 6.4 oz)  04/03/15 79.379 kg (175 lb)     Intake/Output Summary (Last 24 hours) at 11/30/15 1101 Last data filed at 11/30/15 0700  Gross per 24 hour  Intake   1660 ml  Output   2050 ml  Net   -390 ml     Physical Exam  Awake Alert, Oriented X 3, No new F.N deficits, Normal affect [place].AT,PERRAL Wearing c-collar,No JVD, No cervical lymphadenopathy appriciated.  Symmetrical Chest wall movement, Good air movement bilaterally, CTAB RRR,No Gallops,Rubs or new Murmurs, No Parasternal Heave +ve B.Sounds, Abd Soft, No tenderness, No organomegaly appriciated, No rebound - guarding or rigidity. No Cyanosis, Clubbing or edema, No new Rash or bruise      Data Review:  Micro Results No results found for this or any previous visit (from the past 240 hour(s)).  Radiology Reports Dg Cervical Spine 2-3 Views  11/29/2015  CLINICAL DATA:  ACDF hardware removal and implant of caudal half plate EXAM: DG C-ARM 61-120 MIN; CERVICAL SPINE - 2-3 VIEW COMPARISON:  Earlier the same day. FINDINGS: Two intraoperative spot fluoro films are submitted. Anterior plate again seen bridging from C4-C6. Although not well seen secondary to overlying shoulders, a second plate is seen caudal to C6. IMPRESSION: Intraoperative evaluation. Electronically Signed   By: Kennith Center M.D.   On: 11/29/2015 21:53   Dg Cervical Spine 2-3 Views  11/13/2015  CLINICAL DATA:  C6-7 ACDF.  Intraoperative imaging. EXAM: CERVICAL SPINE - 2-3 VIEW COMPARISON:  CT cervical spine 08/28/2015. FINDINGS: Again seen is postoperative change of C4-6 ACDF. On the first image, a probe is at the  level of the C6-7 disc interspace. On the second image, anterior fusion hardware is been placed at C6-7. No acute abnormality is identified. IMPRESSION: C6-7 ACDF.  No acute finding. Electronically Signed   By: Drusilla Kanner M.D.   On: 11/13/2015 14:39   Dg Epidurography  11/29/2015  CLINICAL DATA:  Cervical spondylosis without myelopathy. LEFT arm pain. FLUOROSCOPY TIME:  28 seconds corresponding to a Dose Area Product of 24.35 Gy*m2 PROCEDURE: Informed written consent was obtained.  Time-out was performed. CERVICAL EPIDURAL INJECTION An interlaminar approach was performed on the LEFT at C7-T1 . A 20 gauge Crawford epidural needle was advanced using loss-of-resistance technique. DIAGNOSTIC EPIDURAL INJECTION Injection of Isovue-M 300 shows a good epidural pattern with spread above and below the level of needle placement, primarily on the LEFT. No vascular opacification is seen. THERAPEUTIC EPIDURAL INJECTION 1.5 ml of Kenalog 40 mixed with 1 ml of 1% Lidocaine and 2 ml of normal saline were then instilled. The procedure was well-tolerated, and the patient was discharged thirty minutes following the injection in good condition. IMPRESSION: Technically successful first epidural injection on the LEFT at C7-T1. In review of the most recent plain films from [facility], dated 11/27/2015, the recently placed C6-7 fusion construct screws appear to partially enter the C6-7 and C7-T1 disc spaces. Recommend noncontrast CT of the cervical spine for further evaluation, given the laterally positioned plate to the LEFT of midline on the AP spot film today. Electronically Signed   By: Elsie Stain M.D.   On: 11/29/2015 12:53   Dg C-arm 1-60 Min  11/29/2015  CLINICAL DATA:  ACDF hardware removal and implant of caudal half plate EXAM: DG C-ARM 61-120 MIN; CERVICAL SPINE - 2-3 VIEW COMPARISON:  Earlier the same day. FINDINGS: Two intraoperative spot fluoro films are submitted. Anterior plate again seen bridging from C4-C6.  Although not well seen secondary to overlying shoulders, a second plate is seen caudal to C6. IMPRESSION: Intraoperative evaluation. Electronically Signed   By: Kennith Center M.D.   On: 11/29/2015 21:53     CBC  Recent Labs Lab 11/30/15 0148  WBC 11.7*  HGB 12.7*  HCT 37.0*  PLT 165  MCV 86.0  MCH 29.5  MCHC 34.3  RDW 12.8  LYMPHSABS 2.0  MONOABS 0.7  EOSABS 0.1  BASOSABS 0.0    Chemistries   Recent Labs Lab 11/30/15 0148  NA 138  K 3.6  CL 99*  CO2 27  GLUCOSE 143*  BUN 10  CREATININE 0.93  CALCIUM 9.2  AST 26  ALT 18  ALKPHOS 56  BILITOT 0.2*   ------------------------------------------------------------------------------------------------------------------ No results for  input(s): CHOL, HDL, LDLCALC, TRIG, CHOLHDL, LDLDIRECT in the last 72 hours.  No results found for: HGBA1C ------------------------------------------------------------------------------------------------------------------ No results for input(s): TSH, T4TOTAL, T3FREE, THYROIDAB in the last 72 hours.  Invalid input(s): FREET3 ------------------------------------------------------------------------------------------------------------------ No results for input(s): VITAMINB12, FOLATE, FERRITIN, TIBC, IRON, RETICCTPCT in the last 72 hours.  Coagulation profile No results for input(s): INR, PROTIME in the last 168 hours.  No results for input(s): DDIMER in the last 72 hours.  Cardiac Enzymes No results for input(s): CKMB, TROPONINI, MYOGLOBIN in the last 168 hours.  Invalid input(s): CK ------------------------------------------------------------------------------------------------------------------ No results found for: BNP  Time Spent in minutes   35   Susa Raring K M.D on 11/30/2015 at 11:01 AM  Between 7am to 7pm - Pager - [phone]  After 7pm go to [email or web address] - password [facility]  [facility] -  Office  [phone]

## 2015-12-01 NOTE — Discharge Summary (Signed)
  Physician Discharge Summary  Patient ID: Corey Gilbert MRN: [id number] DOB/AGE: [birth year] 50 y.o.  Admit date: 11/29/2015 Discharge date: 12/01/2015  Admission Diagnoses: Displaced hardware  Discharge Diagnoses: Same Principal Problem:   Cervical stenosis of spinal canal Active Problems:   Affective bipolar disorder (HCC)   Anxiety   Chronic hepatitis C (HCC)   DJD (degenerative joint disease), cervical   Elevated blood pressure   Discharged Condition: good  Hospital Course: Patient was admitted through the office with dislodge displaced anterior cervical hardware bone graft and radiculitis. Patient is urgently taken back to the OR underwent reexploration of ACDF with repositioning of implants and redo foraminotomy. Postop patient did fairly well the morning of postop day 1 had sig improvement preoperative radicular symptoms no swallowing difficulty he progress immobilize throughout the day and then requested being discharged to a patient was discharged home scheduled follow-up with Dr. Jeral FruitBotero in approximately 1-2 weeks. Patient was discharged a hard cervical collar and oral oxycodone.  Consults: Significant Diagnostic Studies: Treatments: Reexploration of ACDF redo foraminotomies and fusion Discharge Exam: Blood pressure 158/107, pulse 68, temperature 98.2 F (36.8 C), resp. rate 11, SpO2 93 %. Strength out of 5 wound clean dry and intact  Disposition: Home     Medication List    TAKE these medications        aspirin-acetaminophen-caffeine 250-250-65 MG tablet  Commonly known as:  EXCEDRIN MIGRAINE  Take 1 tablet by mouth every 6 (six) hours as needed for migraine.     cyclobenzaprine 10 MG tablet  Commonly known as:  FLEXERIL  Take 10 mg by mouth every 8 (eight) hours as needed (pain).     Oxycodone HCl 10 MG Tabs  Take 10 mg by mouth 4 (four) times daily as needed (pain).     Oxycodone HCl 10 MG Tabs  Take 1 tablet (10 mg total) by mouth every 4 (four) hours as  needed for moderate pain (pain).      ASK your doctor about these medications        TIGER BALM MUSCLE RUB EX  Apply 1 application topically as needed (for pain).           Follow-up Information    Follow up with Karn CassisBOTERO,ERNESTO M, MD.   Specialty:  Neurosurgery   Contact information:   [address] [place] [address] [phone]       Signed: Mariam DollarCRAM,Panda Crossin P 12/01/2015, 10:06 AM

## 2015-12-01 NOTE — Discharge Instructions (Signed)
No lifting no bending no twisting no driving nothing using his shoulders no physical activity

## 2015-12-01 NOTE — Anesthesia Postprocedure Evaluation (Signed)
Anesthesia Post Note  Patient: Corey Gilbert  Procedure(s) Performed: Procedure(s) (LRB): Removal of anterior cervical plate and Insertion of Half-Plate Caudal (N/A)  Patient location during evaluation: PACU Anesthesia Type: General Level of consciousness: awake and alert and patient cooperative Pain management: pain level controlled Vital Signs Assessment: post-procedure vital signs reviewed and stable Respiratory status: spontaneous breathing and respiratory function stable Cardiovascular status: stable Anesthetic complications: no    Last Vitals:  Filed Vitals:   11/30/15 0500 11/30/15 0746  BP: 158/107   Pulse: 68   Temp: 36.8 C   Resp: 14 11    Last Pain:  Filed Vitals:   11/30/15 1442  PainSc: 4                  Nyeshia Mysliwiec S

## 2015-12-02 ENCOUNTER — Encounter (HOSPITAL_COMMUNITY): Payer: Self-pay | Admitting: Neurosurgery

## 2015-12-02 LAB — WOUND CULTURE: CULTURE: NO GROWTH

## 2015-12-04 ENCOUNTER — Encounter (HOSPITAL_COMMUNITY): Payer: Self-pay | Admitting: Neurosurgery

## 2015-12-04 LAB — ANAEROBIC CULTURE

## 2015-12-17 ENCOUNTER — Other Ambulatory Visit: Payer: Self-pay | Admitting: Neurosurgery

## 2015-12-17 DIAGNOSIS — M5412 Radiculopathy, cervical region: Secondary | ICD-10-CM

## 2015-12-19 ENCOUNTER — Ambulatory Visit
Admission: RE | Admit: 2015-12-19 | Discharge: 2015-12-19 | Disposition: A | Discharge status: Home / Self Care | Payer: Medicaid Other | Source: Ambulatory Visit | Attending: Neurosurgery | Admitting: Neurosurgery

## 2015-12-19 ENCOUNTER — Encounter (HOSPITAL_COMMUNITY): Payer: Self-pay | Admitting: *Deleted

## 2015-12-19 ENCOUNTER — Emergency Department (HOSPITAL_COMMUNITY)
Admission: EM | Admit: 2015-12-19 | Discharge: 2015-12-20 | Disposition: A | Discharge status: Home / Self Care | Payer: Medicaid Other | Attending: Emergency Medicine | Admitting: Emergency Medicine

## 2015-12-19 VITALS — BP 160/117 | HR 72

## 2015-12-19 DIAGNOSIS — Z87828 Personal history of other (healed) physical injury and trauma: Secondary | ICD-10-CM | POA: Diagnosis not present

## 2015-12-19 DIAGNOSIS — M4802 Spinal stenosis, cervical region: Secondary | ICD-10-CM

## 2015-12-19 DIAGNOSIS — Z8619 Personal history of other infectious and parasitic diseases: Secondary | ICD-10-CM | POA: Insufficient documentation

## 2015-12-19 DIAGNOSIS — M5412 Radiculopathy, cervical region: Secondary | ICD-10-CM

## 2015-12-19 DIAGNOSIS — G971 Other reaction to spinal and lumbar puncture: Secondary | ICD-10-CM | POA: Diagnosis not present

## 2015-12-19 DIAGNOSIS — M1990 Unspecified osteoarthritis, unspecified site: Secondary | ICD-10-CM | POA: Insufficient documentation

## 2015-12-19 DIAGNOSIS — Z8659 Personal history of other mental and behavioral disorders: Secondary | ICD-10-CM | POA: Diagnosis not present

## 2015-12-19 DIAGNOSIS — Z87442 Personal history of urinary calculi: Secondary | ICD-10-CM | POA: Insufficient documentation

## 2015-12-19 DIAGNOSIS — R51 Headache: Secondary | ICD-10-CM | POA: Diagnosis present

## 2015-12-19 DIAGNOSIS — Z9889 Other specified postprocedural states: Secondary | ICD-10-CM | POA: Insufficient documentation

## 2015-12-19 DIAGNOSIS — G8928 Other chronic postprocedural pain: Secondary | ICD-10-CM

## 2015-12-19 DIAGNOSIS — M961 Postlaminectomy syndrome, not elsewhere classified: Secondary | ICD-10-CM

## 2015-12-19 DIAGNOSIS — M542 Cervicalgia: Secondary | ICD-10-CM | POA: Insufficient documentation

## 2015-12-19 DIAGNOSIS — M47812 Spondylosis without myelopathy or radiculopathy, cervical region: Secondary | ICD-10-CM

## 2015-12-19 MED ORDER — HYDROMORPHONE HCL 1 MG/ML IJ SOLN
1.0000 mg | Freq: Once | INTRAMUSCULAR | Status: AC
  Administered 2015-12-19: 1 mg via INTRAVENOUS
  Filled 2015-12-19: qty 1

## 2015-12-19 MED ORDER — HYDROMORPHONE HCL 1 MG/ML IJ SOLN
2.0000 mg | Freq: Once | INTRAMUSCULAR | Status: AC
  Administered 2015-12-19: 2 mg via INTRAVENOUS
  Filled 2015-12-19: qty 2

## 2015-12-19 MED ORDER — LORAZEPAM 2 MG/ML IJ SOLN
1.0000 mg | Freq: Once | INTRAMUSCULAR | Status: AC
  Administered 2015-12-19: 1 mg via INTRAVENOUS
  Filled 2015-12-19: qty 1

## 2015-12-19 MED ORDER — HYDROMORPHONE HCL 2 MG/ML IJ SOLN: 4.0000 mg | Freq: Once | INTRAMUSCULAR | Status: DC

## 2015-12-19 MED ORDER — HYDROXYZINE HCL 50 MG/ML IM SOLN
25.0000 mg | Freq: Once | INTRAMUSCULAR | Status: AC
  Administered 2015-12-19: 25 mg via INTRAMUSCULAR

## 2015-12-19 MED ORDER — HYDROMORPHONE HCL 2 MG/ML IJ SOLN
2.0000 mg | Freq: Once | INTRAMUSCULAR | Status: AC
  Administered 2015-12-19: 2 mg via INTRAMUSCULAR

## 2015-12-19 MED ORDER — IOHEXOL 300 MG/ML  SOLN
10.0000 mL | Freq: Once | INTRAMUSCULAR | Status: AC | PRN
  Administered 2015-12-19: 10 mL via INTRATHECAL

## 2015-12-19 MED ORDER — DIAZEPAM 5 MG PO TABS
10.0000 mg | ORAL_TABLET | Freq: Once | ORAL | Status: AC
Start: 2015-12-19 — End: 2015-12-19
  Administered 2015-12-19: 10 mg via ORAL

## 2015-12-19 MED ORDER — SODIUM CHLORIDE 0.9 % IV BOLUS (SEPSIS): 1000.0000 mL | Freq: Once | INTRAVENOUS | Status: DC

## 2015-12-19 MED ORDER — CAFFEINE-SODIUM BENZOATE 125-125 MG/ML IJ SOLN
500.0000 mg | Freq: Once | INTRAMUSCULAR | Status: AC
  Administered 2015-12-19: 500 mg via INTRAVENOUS
  Filled 2015-12-19: qty 2

## 2015-12-19 MED ORDER — HYDROMORPHONE HCL 1 MG/ML IJ SOLN
4.0000 mg | Freq: Once | INTRAMUSCULAR | Status: AC
  Administered 2015-12-19: 4 mg via INTRAVENOUS
  Filled 2015-12-19: qty 4

## 2015-12-19 MED ORDER — SODIUM CHLORIDE 0.9 % IV BOLUS (SEPSIS)
2000.0000 mL | Freq: Once | INTRAVENOUS | Status: AC
  Administered 2015-12-19: 2000 mL via INTRAVENOUS

## 2015-12-19 NOTE — ED Provider Notes (Signed)
CSN: [id number]     Arrival date & time 12/19/15  1801 History   First MD Initiated Contact with Patient 12/19/15 1831     Chief Complaint  Patient presents with  . Back Pain     (Consider location/radiation/quality/duration/timing/severity/associated sxs/prior Treatment) HPI This is a 50 year old male with a past medical history of bipolar disorder, previous neck surgery. He presents emergency Department with chief complaint of neck pain and headache after a cervical myelogram performed today. The patient had a revision surgery on 11/13/2015 from now aligned spinal hardware and left arm radiculopathy. His pain is improved since his surgery, but he continues to have radiculopathy symptoms. Patient had a myelogram today to evaluate his hardware. He denies any new neurologic symptoms. He complains of pain in his neck and a headache. She rates it 10 out of 10. To 2 mg of by mouth Dilaudid at 3 PM today without relief of his symptoms. He denies nausea, vomiting, visual disturbances, weakness in the upper extremities, shortness of breath. He denies any unilateral weakness. His wife states that she felt like he was anxious when he awoke after the procedure today. Past Medical History  Diagnosis Date  . Near syncope     due to opain, last time  early Sept 2013  . Depression   . Concussion   . Kidney stones   . Arthritis   . Family history of anesthesia complication     pon& v  . Bipolar disorder (HCC)   . Anxiety   . Headache   . Hepatitis     Hepatitis C - has been treated with Harvoni  . Complication of anesthesia   . PONV (postoperative nausea and vomiting)     not on most recent surgery   Past Surgical History  Procedure Laterality Date  . [facility]  . Wrist surgery  2000    right  . Lumbar laminectomy/decompression microdiscectomy  06/14/2012    Procedure: LUMBAR LAMINECTOMY/DECOMPRESSION MICRODISCECTOMY 1 LEVEL;  Surgeon: Karn CassisErnesto M Botero, MD;  Location:  [facility];  Service: Neurosurgery;  Laterality: Left;  Left Lumbar Removal of Fragment/Laminectomy Lumbar Three-Four  . [facility]  . Sp arthro thumb*l*  2013  left  2000 right    bilateral tendon  release.   Marland Kitchen. Anterior cervical decomp/discectomy fusion  09/27/2012    Procedure: ANTERIOR CERVICAL DECOMPRESSION/DISCECTOMY FUSION 2 LEVELS;  Surgeon: Karn CassisErnesto M Botero, MD;  Location: [facility];  Service: Neurosurgery;  Laterality: N/A;  Cervical four-five, Cervical five-six,  Anterior cervical decompression/diskectomy/fusion/plate  . Lumbar wound debridement N/A 02/16/2015    Procedure: LUMBAR WOUND DEBRIDEMENT;  Surgeon: Coletta MemosKyle Cabbell, MD;  Location: [facility];  Service: Neurosurgery;  Laterality: N/A;  . Lithotripsy    . Anterior cervical decomp/discectomy fusion N/A 11/13/2015    Procedure: C6-7 Anterior cervical decompression/diskectomy/fusion;  Surgeon: Hilda LiasErnesto Botero, MD;  Location: [facility];  Service: Neurosurgery;  Laterality: N/A;  C6-7 Anterior cervical decompression/diskectomy/fusion  . Anterior cervical decomp/discectomy fusion N/A 11/29/2015    Procedure: Removal of anterior cervical plate and Insertion of Half-Plate Caudal;  Surgeon: Hilda LiasErnesto Botero, MD;  Location: [facility];  Service: Neurosurgery;  Laterality: N/A;  Removal of anterior cervical plate and Insertion of Half-Plate Caudal   Family History  Problem Relation Age of Onset  . Arrhythmia Mother    Social History  Substance Use Topics  . Smoking status: Never Smoker   .  Smokeless tobacco: Never Used  . Alcohol Use: No     Comment: rarely    Review of Systems  Ten systems reviewed and are negative for acute change, except as noted in the HPI.    Allergies  Acetaminophen; Baclofen; Gabapentin; Ibuprofen; Lyrica; Toradol; and Other  Home Medications   Prior to Admission medications   Medication Sig Start Date End Date Taking? Authorizing Provider  aspirin-acetaminophen-caffeine  (EXCEDRIN MIGRAINE) [id number] MG per tablet Take 1 tablet by mouth every 6 (six) hours as needed for migraine.     Historical Provider, MD  Camphor-Menthol-Methyl Sal (TIGER BALM MUSCLE RUB EX) Apply 1 application topically as needed (for pain).    Historical Provider, MD  cyclobenzaprine (FLEXERIL) 10 MG tablet Take 10 mg by mouth every 8 (eight) hours as needed (pain).  01/28/15   Historical Provider, MD  oxyCODONE 10 MG TABS Take 1 tablet (10 mg total) by mouth every 4 (four) hours as needed for moderate pain (pain). 11/30/15   Donalee Citrin, MD   BP 174/109 mmHg  Pulse 70  Temp(Src) 97.9 F (36.6 C) (Oral)  Resp 18  SpO2 97% Physical Exam  Constitutional: He is oriented to person, place, and time. He appears well-developed and well-nourished. No distress.  HENT:  Head: Normocephalic and atraumatic.  Mouth/Throat: Oropharynx is clear and moist.  Eyes: Conjunctivae and EOM are normal. Pupils are equal, round, and reactive to light. No scleral icterus.  No horizontal, vertical or rotational nystagmus  Neck: Normal range of motion. Neck supple.  Patient in cervical or focal collar.  Cardiovascular: Normal rate, regular rhythm and intact distal pulses.   Pulmonary/Chest: Effort normal and breath sounds normal. No respiratory distress. He has no wheezes. He has no rales.  Abdominal: Soft. Bowel sounds are normal. There is no tenderness. There is no rebound and no guarding.  Musculoskeletal: Normal range of motion.  Lymphadenopathy:    He has no cervical adenopathy.  Neurological: He is alert and oriented to person, place, and time. He has normal reflexes. No cranial nerve deficit. He exhibits normal muscle tone. Coordination normal.  Mental Status:  Alert, oriented, thought content appropriate. Speech fluent without evidence of aphasia. Able to follow 2 step commands without difficulty.  Cranial Nerves:  II:  Peripheral visual fields grossly normal, pupils equal, round, reactive to  light III,IV, VI: ptosis not present, extra-ocular motions intact bilaterally  V,VII: smile symmetric, facial light touch sensation equal VIII: hearing grossly normal bilaterally  IX,X: midline uvula rise  XI: bilateral shoulder shrug equal and strong XII: midline tongue extension  Motor:  5/5 in upper and lower extremities bilaterally including strong and equal grip strength and dorsiflexion/plantar flexion Sensory: Pinprick and light touch normal in all extremities.  Deep Tendon Reflexes: 2+ and symmetric  Cerebellar: normal finger-to-nose with bilateral upper extremities Gait: normal gait and balance CV: distal pulses palpable throughout   Skin: Skin is warm and dry. No rash noted. He is not diaphoretic.  Psychiatric: He has a normal mood and affect. His behavior is normal. Judgment and thought content normal.  Nursing note and vitals reviewed.   ED Course  Procedures (including critical care time) Labs Review Labs Reviewed - No data to display  Imaging Review Ct Cervical Spine W Contrast  12/19/2015  CLINICAL DATA:  Persistent bilateral upper extremity radiculitis following cervical spine fusion. FLUOROSCOPY TIME:  253.11 uGy*m2 PROCEDURE: LUMBAR PUNCTURE FOR CERVICAL MYELOGRAM After thorough discussion of risks and benefits of the procedure including bleeding, infection, injury  to nerves, blood vessels, adjacent structures as well as headache and CSF leak, written and oral informed consent was obtained. Consent was obtained by Dr. Marin Roberts. We discussed the high likelihood of obtaining a diagnostic study. Patient was positioned prone on the fluoroscopy table. Local anesthesia was provided with 1% lidocaine without epinephrine after prepped and draped in the usual sterile fashion. Puncture was performed at L2-3 using a 3 1/2 inch 22-gauge spinal needle via right paramedian approach. Using a single pass through the dura, the needle was placed within the thecal sac, with return  of clear CSF. 10 mL of Isovue-M 300 was injected into the thecal sac, with normal opacification of the nerve roots and cauda equina consistent with free flow within the subarachnoid space. The patient was then moved to the trendelenburg position and contrast flowed into the Cervical spine region. I personally performed the lumbar puncture and administered the intrathecal contrast. I also personally supervised acquisition of the myelogram images. TECHNIQUE: Contiguous axial images were obtained through the Cervical spine after the intrathecal infusion of infusion. Coronal and sagittal reconstructions were obtained of the axial image sets. FINDINGS: CERVICAL MYELOGRAM FINDINGS: Previous cervical fusion at C4-5 and C5-6 is stable. Slight anterolisthesis at C6-7 is stable from 3/22 and from the interoperative radiographs. The half plate at Z6-1 is stable in position. Intrathecal contrast is somewhat faint. Please see the CT report below. Anterolisthesis at C6-7 is also evident on the upright images. This is slightly worse with flexion and reduced in extension. CT CERVICAL MYELOGRAM FINDINGS: Anterolisthesis at C6-7 is not evident on the supine CT images, suggesting there is movement at this level. Fusion at C4-5 and C5-6 is solid. Heart rate these levels is intact. The superiorly directed screw along the inferior endplate of C6 is incompletely covered by bone along its inferior margin. There is slight lucency about both screws at C7. There is some lucency about the anterior plate is well. It is grossly stable in location. C2-3: Asymmetric left-sided uncovertebral and facet spurring results in mild left foraminal narrowing, unchanged. C3-4: Asymmetric left-sided uncovertebral and facet spurring is stable. Moderate left and mild right foraminal stenosis is present. C4-5: Anterior fusion is present. Central canal is decompressed. Uncovertebral spurring contributes to mild foraminal narrowing bilaterally. C5-6: Anterior  fusion is solid. The central canal is decompressed. Uncovertebral spurring is worse on the left. There is no definite stenosis. C6-7: The new hardware is present at this level. There is soft tissue impression on the left lateral aspect of the central canal and likely impinging on the left foramen. Osseous foraminal narrowing is present on the right. C7-T1: Asymmetric left-sided facet hypertrophy is present. There is no focal stenosis. IMPRESSION: 1. Incomplete purchase of the superior screw associated with the half plate at W9-6. The screw is not covered with bone along its inferior margin at the inferior endplate of C6. 2. Slight lucency about the screws bilaterally at C7 suggesting some loosening. 3. Slight anterolisthesis at C6-7 non plan from radiographs with the patient prone and on the upright images. This is slightly worse with flexion. The anterolisthesis is reduced on the CT scan with the patient in the supine position. 4. Soft tissue narrowing of the left central canal and foramen at C6-7 may reflect disc material or granulation tissue. 5. The central canal is decompressed at C4-5 and C5-6 following previous surgery. 6. Mild foraminal narrowing bilaterally at C4-5 is stable. 7. Moderate left and mild right foraminal stenosis at C3-4 is stable. 8. Mild  left foraminal narrowing at C2-3 is stable. Electronically Signed   By: Marin Roberts M.D.   On: 12/19/2015 12:04   Dg Myelography Lumbar Inj Cervical  12/19/2015  CLINICAL DATA:  Persistent bilateral upper extremity radiculitis following cervical spine fusion. FLUOROSCOPY TIME:  253.11 uGy*m2 PROCEDURE: LUMBAR PUNCTURE FOR CERVICAL MYELOGRAM After thorough discussion of risks and benefits of the procedure including bleeding, infection, injury to nerves, blood vessels, adjacent structures as well as headache and CSF leak, written and oral informed consent was obtained. Consent was obtained by Dr. Marin Roberts. We discussed the high likelihood  of obtaining a diagnostic study. Patient was positioned prone on the fluoroscopy table. Local anesthesia was provided with 1% lidocaine without epinephrine after prepped and draped in the usual sterile fashion. Puncture was performed at L2-3 using a 3 1/2 inch 22-gauge spinal needle via right paramedian approach. Using a single pass through the dura, the needle was placed within the thecal sac, with return of clear CSF. 10 mL of Isovue-M 300 was injected into the thecal sac, with normal opacification of the nerve roots and cauda equina consistent with free flow within the subarachnoid space. The patient was then moved to the trendelenburg position and contrast flowed into the Cervical spine region. I personally performed the lumbar puncture and administered the intrathecal contrast. I also personally supervised acquisition of the myelogram images. TECHNIQUE: Contiguous axial images were obtained through the Cervical spine after the intrathecal infusion of infusion. Coronal and sagittal reconstructions were obtained of the axial image sets. FINDINGS: CERVICAL MYELOGRAM FINDINGS: Previous cervical fusion at C4-5 and C5-6 is stable. Slight anterolisthesis at C6-7 is stable from 3/22 and from the interoperative radiographs. The half plate at W1-1 is stable in position. Intrathecal contrast is somewhat faint. Please see the CT report below. Anterolisthesis at C6-7 is also evident on the upright images. This is slightly worse with flexion and reduced in extension. CT CERVICAL MYELOGRAM FINDINGS: Anterolisthesis at C6-7 is not evident on the supine CT images, suggesting there is movement at this level. Fusion at C4-5 and C5-6 is solid. Heart rate these levels is intact. The superiorly directed screw along the inferior endplate of C6 is incompletely covered by bone along its inferior margin. There is slight lucency about both screws at C7. There is some lucency about the anterior plate is well. It is grossly stable in  location. C2-3: Asymmetric left-sided uncovertebral and facet spurring results in mild left foraminal narrowing, unchanged. C3-4: Asymmetric left-sided uncovertebral and facet spurring is stable. Moderate left and mild right foraminal stenosis is present. C4-5: Anterior fusion is present. Central canal is decompressed. Uncovertebral spurring contributes to mild foraminal narrowing bilaterally. C5-6: Anterior fusion is solid. The central canal is decompressed. Uncovertebral spurring is worse on the left. There is no definite stenosis. C6-7: The new hardware is present at this level. There is soft tissue impression on the left lateral aspect of the central canal and likely impinging on the left foramen. Osseous foraminal narrowing is present on the right. C7-T1: Asymmetric left-sided facet hypertrophy is present. There is no focal stenosis. IMPRESSION: 1. Incomplete purchase of the superior screw associated with the half plate at B1-4. The screw is not covered with bone along its inferior margin at the inferior endplate of C6. 2. Slight lucency about the screws bilaterally at C7 suggesting some loosening. 3. Slight anterolisthesis at C6-7 non plan from radiographs with the patient prone and on the upright images. This is slightly worse with flexion. The anterolisthesis is  reduced on the CT scan with the patient in the supine position. 4. Soft tissue narrowing of the left central canal and foramen at C6-7 may reflect disc material or granulation tissue. 5. The central canal is decompressed at C4-5 and C5-6 following previous surgery. 6. Mild foraminal narrowing bilaterally at C4-5 is stable. 7. Moderate left and mild right foraminal stenosis at C3-4 is stable. 8. Mild left foraminal narrowing at C2-3 is stable. Electronically Signed   By: Marin Roberts M.D.   On: 12/19/2015 12:04   I have personally reviewed and evaluated these images and lab results as part of my medical decision-making.   EKG  Interpretation None      MDM   Final diagnoses:  Post lumbar puncture headache    Patient with back pain and headache after myelogram today. No known neurologic deficits. Spoken with Dr. Venetia Maxon, who recommends treatment for LP headache including caffeine, pain meds and fluids.   Patient treated with high-dose narcotics. He feels that his pain is only minimally controlled. Caffeine has improved his headache. He complains of pain in between his shoulder blades. Patient is advised to follow up tomorrow with Dr. Jeral Fruit at his neurosurgery clinic. He appears safe for discharge at this time.Patient seen in shared visit with attending physician.     Arthor Captain, PA-C 12/20/15 7829  Pricilla Loveless, MD 12/24/15 1146

## 2015-12-19 NOTE — ED Notes (Signed)
Called greg in regards to dilauded. Med being adjusted.

## 2015-12-19 NOTE — Discharge Instructions (Signed)
Epidural Blood Patch for Spinal Headache °An epidural blood patch is a procedure that is used to treat a headache that occurs when there is a leak of spinal fluid. This type of headache is called a spinal headache or post-dural puncture headache. Spinal headaches sometimes occur after a person undergoes a type of anesthesia called epidural anesthesia or after a lumbar puncture (also called a spinal tap).  °Generally, an epidural blood patch is done when a spinal headache has not been relieved by 2-3 days of:  °· Bed rest.   °· Drinking lots of fluids.   °· Taking oral medicines for pain, including nonsteroidal anti-inflammatory agents and caffeine. °It may also be done to treat a person who has had epidural anesthesia and is experiencing:  °· Neck pain and stiffness that are very severe and associated with vomiting.   °· Hearing loss.   °· Double vision.   °An epidural blood patch is not done when:  °· Your headache is due to an infection in the lower back (lumbar) area or the blood.   °· You have bleeding tendencies.   °· You are taking certain blood-thinning medicines. °LET YOUR HEALTH CARE PROVIDER KNOW ABOUT: °· Any allergies you have.   °· All medicines you are taking, including vitamins, herbs, eye drops, creams, and over-the-counter medicines.   °· Previous problems you or members of your family have had with the use of anesthetics.   °· Any blood disorders you have.   °· Previous surgeries you have had.   °· Medical conditions you have.   °RISKS AND COMPLICATIONS °Generally, an epidural blood patch is a safe procedure. However, as with any procedure, complications can occur. Possible complications include:  °· Backache. °· Nerve pain, tingling, or numbness. °· Bleeding. °· Infection. °Complications are more likely to occur in people who have bleeding disorders or infections. °BEFORE THE PROCEDURE °· Drink a lot of water the day before your procedure. °· Make sure your health care provider knows about all  medicines and dietary or herbal supplements that you are taking. Take them as directed and find out if you need to stop any of them prior to the procedure. °· Follow your health care provider's instructions for when to stop eating and drinking. °· Arrange for someone to drive you to and from the procedure. °PROCEDURE  °· You will have two IV lines placed--one to give you fluids and medicines during the procedure and one to withdraw blood for the patch. °· You will lie on your stomach. °· An X-ray machine will take pictures of your back to locate the area of leakage. °· Dye may be injected so that the area can be seen well on an X-ray. °· Blood will be drawn from your arm and injected into the leaking area. When the blood is injected, you may feel tightness in your buttocks, lower back, or thighs. °AFTER THE PROCEDURE  °You will be expected to lie on your back for 2-4 hours with some pillows under your knees. It is important to lie still while on your back so that a good clot can form. You should also avoid any straining, especially right after the procedure.  °Most people obtain almost instant relief from the spinal headache. In some, the relief comes on gradually over a 24-hour period. Some people experience mild backaches for a few days. In a few cases, people also have a mild, passing sensation of prickly or tingly skin (paresthesia), neck pain, or nerve-root pain. °  °This information is not intended to replace advice given to you by   your health care provider. Make sure you discuss any questions you have with your health care provider.   Document Released: 02/27/2002 Document Revised: 06/28/2013 Document Reviewed: 04/19/2013 Elsevier Interactive Patient Education 2016 [employer] Lumbar Puncture A lumbar puncture, or spinal tap, is a procedure in which a small amount of the fluid that surrounds the brain and spinal cord is removed and examined. The fluid is called the cerebrospinal fluid. This procedure  may be done to:   Help diagnose various problems, such as meningitis, encephalitis, multiple sclerosis, and AIDS.   Remove fluid and relieve pressure that occurs with certain types of headaches.   Look for bleeding within the brain and spinal cord areas (central nervous system).   Place medicine into the spinal fluid.  LET [facility] PROVIDER KNOW ABOUT:  Any allergies you have.  All medicines you are taking, including vitamins, herbs, eye drops, creams, and over-the-counter medicines.  Previous problems you or members of your family have had with the use of anesthetics.  Any blood disorders you have.  Previous surgeries you have had.  Medical conditions you have. RISKS AND COMPLICATIONS Generally, this is a safe procedure. However, as with any procedure, complications can occur. Possible complications include:   Spinal headache. This is a severe headache that occurs when there is a leak of spinal fluid. A spinal headache causes discomfort but is not dangerous. If it persists, another procedure may be done to treat the headache.  Bleeding. This most often occurs in people with bleeding disorders. These are disorders in which the blood does not clot normally.   Infection at the insertion site that can spread to the bone or spinal fluid.  Formation of a spinal cord tumor (rare).  Brain herniation or movement of the brain into the spinal cord (rare).  Inability to move (extremely rare). BEFORE THE PROCEDURE  You may have blood tests done. These tests can help tell how well your kidneys and liver are working. They can also show how well your blood clots.   If you take blood thinners (anticoagulant medicine), ask your health care provider if and when you should stop taking them.   Your health care provider may order a CT scan of your brain.  Make arrangements for someone to drive you home after the procedure.  PROCEDURE  You will be positioned so that the  spaces between the bones of the spine (vertebrae) are as wide as possible. This will make it easier to pass the needle into the spinal canal.  Depending on your age and size, you may lie on your side, curled up with your knees under your chin. Or, you may sit with your head resting on a pillow that is placed at waist level.  The skin covering the lower back (or lumbar region) will be cleaned.   The skin may be numbed with medicine.  You may be given pain medicine or a medicine to help you relax (sedative).  A small needle will be inserted in the skin until it enters the space that contains the spinal fluid. The needle will not enter the spinal cord.   The spinal fluid will be collected into tubes.   The needle will be withdrawn, and a bandage will be placed on the site.  AFTER THE PROCEDURE  You will remain lying down for 1 hour or for as long as your health care provider suggests.   The spinal fluid will be sent to a laboratory to be examined. The  results of the examination may be available before you go home.  A test, called a culture, may be taken of the spinal fluid if your health care provider thinks you have an infection. If cultures were taken for exam, the results will usually be available in a couple of days.    This information is not intended to replace advice given to you by your health care provider. Make sure you discuss any questions you have with your health care provider.   Document Released: 09/04/2000 Document Revised: 06/28/2013 Document Reviewed: 05/15/2013 Elsevier Interactive Patient Education 2016 [employer] Myelography Myelography is an X-ray exam in which a special dye (contrast medium) is used to examine your spinal cord and nerve roots. The contrast medium helps to illuminate the spinal structures under examination. The exam is used to detect spinal cord problems, including spinal cord injury, disk ruptures, cysts, and tumors.  LET [facility]  CARE PROVIDER KNOW ABOUT:  Any allergies you have.  All medicines you are taking, including vitamins, herbs, eyedrops, and over-the-counter medicines and creams.  Previous problems you or members of your family have had with the use of anesthetics or contrast media.  Any blood disorders you have.  Other health problems you have. RISKS AND COMPLICATIONS Generally, myelography is a safe procedure. However, as with any surgical procedure, complications can occur. Possible complications associated with myelography include:  Spinal fluid infection.  Allergic reaction to the contrast medium.  Loss of spinal fluid (can lead to severe headaches).  Seizures (rare). BEFORE THE PROCEDURE You will need to arrange for someone to drive you home after the procedure.  PROCEDURE   You will be positioned face down on a table.  Medicine may be given to you to help you relax.  A numbing medicine will be applied to the insertion site.  A needle will be inserted between your vertebrae. An imaging technique called fluoroscopy will be used to help your health care provider see the needle between the bones of your spine and guide it into the sac that surrounds your spinal cord and nerves (dura).  Contrast medium will be injected into the dura.  The table you lie on may be tilted in different directions to move the contrast medium around the dura.  A series of X-rays or computed tomography (CT) will be done. AFTER THE PROCEDURE After your procedure, you will be taken to a recovery area where you will lie flat with your head in an elevated position for a few hours before being discharged. This reduces the risk of a severe headache.    This information is not intended to replace advice given to you by your health care provider. Make sure you discuss any questions you have with your health care provider.   Document Released: 04/30/2004 Document Revised: 09/28/2014 Document Reviewed:  06/01/2012 Elsevier Interactive Patient Education 2016 [employer]. Myelography, Care After These instructions give you information on caring for yourself after your procedure. Your doctor may also give you more specific instructions. Call your doctor if you have any problems or questions after your procedure. HOME CARE  Rest the first day.  When you rest, lie flat, with your head slightly raised (elevated).  Avoid heavy lifting and activity for 48 hours, or as told by your doctor.  You may take the bandage (dressing) off one day after the test, or as told by your doctor.  Take all medicines only as told by your doctor.  Ask your doctor when it is okay  to take a shower or bath.  Ask your doctor when your test results will be ready and how you can get them. Make sure you follow up and get your results.  Do not drink alcohol for 24 hours, or as told by your doctor.  Drink enough fluid to keep your pee (urine) clear or pale yellow. GET HELP IF:   You have a fever.  You have a headache.  You feel sick to your stomach (nauseous) or throw up (vomit).  You have pain or cramping in your belly (abdomen). GET HELP RIGHT AWAY IF:   You have a headache with a stiff neck or fever.  You have trouble breathing.  Any of the places where the needles were put in are:  Puffy (swollen) or red.  Sore or hot to the touch.  Draining yellowish-white fluid (pus).  Bleeding. MAKE SURE YOU:  Understand these instructions.  Will watch your condition.  Will get help right away if you are not doing well or get worse.   This information is not intended to replace advice given to you by your health care provider. Make sure you discuss any questions you have with your health care provider.   Document Released: 06/16/2008 Document Revised: 09/28/2014 Document Reviewed: 06/01/2012 Elsevier Interactive Patient Education [employer]

## 2015-12-19 NOTE — Discharge Instructions (Signed)

## 2015-12-19 NOTE — ED Notes (Signed)
Verified with charge, morgan, patient received IV dilaudid, 4mg , monitoring for 1 hour prior to discharge.

## 2015-12-19 NOTE — ED Notes (Signed)
Pt had myelogram this am and states woke up in excruciating pain between shoulder blades and severe headache.  Pt states he can feel the fluid going back and forth in his spine.  Pt states his head is ringing like tinnitis and his back is throbbing.

## 2015-12-19 NOTE — ED Notes (Signed)
Reported to Corey Gilbert patient requests more pain meds instead of caffeine. She acknowledges, no changes to orders.

## 2015-12-19 NOTE — ED Notes (Signed)
Dr. Criss AlvineGoldston in to see the patient.

## 2016-01-21 ENCOUNTER — Other Ambulatory Visit: Payer: Self-pay | Admitting: Neurosurgery

## 2016-01-21 DIAGNOSIS — M5412 Radiculopathy, cervical region: Secondary | ICD-10-CM

## 2016-01-27 ENCOUNTER — Ambulatory Visit
Admission: RE | Admit: 2016-01-27 | Discharge: 2016-01-27 | Disposition: A | Discharge status: Home / Self Care | Payer: Medicaid Other | Source: Ambulatory Visit | Attending: Neurosurgery | Admitting: Neurosurgery

## 2016-01-27 DIAGNOSIS — M5412 Radiculopathy, cervical region: Secondary | ICD-10-CM

## 2016-01-27 MED ORDER — IOPAMIDOL (ISOVUE-300) INJECTION 61%
1.0000 mL | Freq: Once | INTRAVENOUS | Status: AC | PRN
  Administered 2016-01-27: 1 mL

## 2016-01-27 MED ORDER — TRIAMCINOLONE ACETONIDE 40 MG/ML IJ SUSP (RADIOLOGY)
60.0000 mg | Freq: Once | INTRAMUSCULAR | Status: AC
  Administered 2016-01-27: 60 mg via EPIDURAL

## 2016-01-27 MED ORDER — DIAZEPAM 5 MG PO TABS
5.0000 mg | ORAL_TABLET | Freq: Once | ORAL | Status: AC
  Administered 2016-01-27: 5 mg via ORAL

## 2016-01-27 NOTE — Discharge Instructions (Signed)

## 2016-02-27 ENCOUNTER — Ambulatory Visit (INDEPENDENT_AMBULATORY_CARE_PROVIDER_SITE_OTHER): Payer: Medicaid Other | Admitting: Neurology

## 2016-02-27 ENCOUNTER — Encounter: Payer: Self-pay | Admitting: Neurology

## 2016-02-27 VITALS — BP 188/122 | HR 72 | Resp 18 | Ht 70.0 in | Wt 178.0 lb

## 2016-02-27 DIAGNOSIS — G43109 Migraine with aura, not intractable, without status migrainosus: Secondary | ICD-10-CM | POA: Diagnosis not present

## 2016-02-27 MED ORDER — RIZATRIPTAN BENZOATE 5 MG PO TBDP: 5.0000 mg | ORAL_TABLET | ORAL | Status: DC | PRN

## 2016-02-27 NOTE — Progress Notes (Signed)
Subjective:    Patient ID: Corey Gilbert is a 50 y.o. male.  HPI     Huston Foley, MD, PhD [facility] [address] [address] [place], [place] [address]  Dear Dr. Jeral Gilbert,   I saw your patient, Corey Gilbert, upon your kind request in my neurologic clinic today for initial consultation of his recurrent headaches. The patient is accompanied by his wife today. As you know, Mr. Corey Gilbert is a 50 year old right-handed gentleman with an underlying medical history of arthritis, degenerative spine disease, status post ACDF, status post multiple neck surgeries, status post concussion on several occasions per verbal report, secondary to playing football in high school and other contact sports, doing martial arts, depression, and anxiety, with a diagnosis of bipolar of many years, kidney stones, history of hepatitis, and mildly overweight state, who reports a history of recurrent headaches for the past 3 years or so. Headaches are pressure like as well as throbbing and usually start in the back of the head and the posterior neck area and travels forwards affecting both temples, typically left side a little worse than right. Associated symptoms or photophobia, sonophobia, nausea, rare vomiting, and blurry vision. Sometimes or often rather he reports having an aura of decrease in visual field and tunnel vision at times with some scintillating scotoma reported or described. He has no overt family history of migraines and no remote history of migraines growing up. He has a prior diagnosis of bipolar disorder and was tried on several medications in the past including Depakote which caused an exacerbation in his hallucinations, he could not tolerate Lyrica or gabapentin, he was apparently also tried on Topamax but is not sure. He could not tolerate any of the SSRI type medications as I understand but is not able to name me the exact ones that he tried. He saw some 3 years ago Dr. Adella Hare  in [place] and may have had a brain MRI at the time. I do not have any records available for review from his office. The patient is advised to try to get records from Dr. Applegate's office. He may have tried amitriptyline at the time and some blood pressure medication, perhaps verapamil . He does not tend to snore and does not have any apneas per wife. It helps to lie down in a darkened room. Sometimes he takes Flexeril at night but it makes him quite sleepy. He is on oxycodone 10 mg strength as needed and tends to take about one pill every other day at this time. He is a nonsmoker, rarely smokes marijuana, and drinks about 1-2 cups of coffee per day, is trying to reduce his soda intake. He takes Excedrin as needed, averaging about once a week. Altogether, his headache frequency is erratic. He can go a week without a headache, altogether, he believes he gets about 10 days of headaches per average month at this time. He lives with his wife and 51 year old son. He does not work. He has prescription eyeglasses. He does not report any neurological accompaniment while having a headache or headache aura such as one-sided weakness, numbness, slurring of speech or droopy face.  He does report a history of memory issues and not being able to concentrate very well, particularly when he has a headache. I reviewed your office notes from, which you kindly included. He had a CT cervical spine with contrast on 12/19/2015 and and a myelogram, and I reviewed the report: IMPRESSION: 1. Incomplete purchase of the superior screw  associated with the half plate at Z6-1C6-7. The screw is not covered with bone along its inferior margin at the inferior endplate of C6. 2. Slight lucency about the screws bilaterally at C7 suggesting some loosening. 3. Slight anterolisthesis at C6-7 non plan from radiographs with the patient prone and on the upright images. This is slightly worse with flexion. The anterolisthesis is reduced on the CT  scan with the patient in the supine position. 4. Soft tissue narrowing of the left central canal and foramen at C6-7 may reflect disc material or granulation tissue. 5. The central canal is decompressed at C4-5 and C5-6 following previous surgery. 6. Mild foraminal narrowing bilaterally at C4-5 is stable. 7. Moderate left and mild right foraminal stenosis at C3-4 is stable. 8. Mild left foraminal narrowing at C2-3 is stable.    His Past Medical History Is Significant For: Past Medical History  Diagnosis Date  . Near syncope     due to opain, last time  early Sept 2013  . Depression   . Concussion   . Kidney stones   . Arthritis   . Family history of anesthesia complication     pon& v  . Bipolar disorder (HCC)   . Anxiety   . Headache   . Hepatitis     Hepatitis C - has been treated with Harvoni  . Complication of anesthesia   . PONV (postoperative nausea and vomiting)     not on most recent surgery  . Bipolar 1 disorder (HCC)   . Brain injuries [facility](HCC)     His Past Surgical History Is Significant For: Past Surgical History  Procedure Laterality Date  . W[facility]  [facility]    [facility]  . Wrist surgery  2000    right  . Lumbar laminectomy/decompression microdiscectomy  06/14/2012    Procedure: LUMBAR LAMINECTOMY/DECOMPRESSION MICRODISCECTOMY 1 LEVEL;  Surgeon: Karn CassisErnesto M Botero, MD;  Location: [facility];  Service: Neurosurgery;  Laterality: Left;  Left Lumbar Removal of Fragment/Laminectomy Lumbar Three-Four  . Back surgery  2013    at [facility]  . Sp arthro thumb*l*  2013  left  2000 right    bilateral tendon  release.   Marland Kitchen. Anterior cervical decomp/discectomy fusion  09/27/2012    Procedure: ANTERIOR CERVICAL DECOMPRESSION/DISCECTOMY FUSION 2 LEVELS;  Surgeon: Karn CassisErnesto M Botero, MD;  Location: [facility];  Service: Neurosurgery;  Laterality: N/A;  Cervical four-five, Cervical five-six,  Anterior cervical decompression/diskectomy/fusion/plate  . Lumbar wound debridement N/A  02/16/2015    Procedure: LUMBAR WOUND DEBRIDEMENT;  Surgeon: Coletta MemosKyle Cabbell, MD;  Location: [facility];  Service: Neurosurgery;  Laterality: N/A;  . Lithotripsy    . Anterior cervical decomp/discectomy fusion N/A 11/13/2015    Procedure: C6-7 Anterior cervical decompression/diskectomy/fusion;  Surgeon: Hilda LiasErnesto Botero, MD;  Location: [facility];  Service: Neurosurgery;  Laterality: N/A;  C6-7 Anterior cervical decompression/diskectomy/fusion  . Anterior cervical decomp/discectomy fusion N/A 11/29/2015    Procedure: Removal of anterior cervical plate and Insertion of Half-Plate Caudal;  Surgeon: Hilda LiasErnesto Botero, MD;  Location: [facility];  Service: Neurosurgery;  Laterality: N/A;  Removal of anterior cervical plate and Insertion of Half-Plate Caudal    His Family History Is Significant For: Family History  Problem Relation Age of Onset  . Arrhythmia Mother     His Social History Is Significant For: Social History   Social History  . Marital Status: Married    Spouse Name: N/A  . Number of Children: 1  . Years  of Education: college   Occupational History  . Unemployed [place]    Social History Main Topics  . Smoking status: Never Smoker   . Smokeless tobacco: Never Used  . Alcohol Use: No     Comment: rarely  . Drug Use: Yes    Special: Marijuana, Oxycodone     Comment: uses marijuana to help bipolar  . Sexual Activity: Yes   Other Topics Concern  . None   Social History Narrative   Lives in wife and son in [place].    Drinks about 1 cup of coffee a day     His Allergies Are:  Allergies  Allergen Reactions  . Acetaminophen Other (See Comments)    Avoids due to history of Hepatitis C  . Baclofen Other (See Comments)    Goes manic  . Gabapentin Other (See Comments)    Goes manic  . Ibuprofen Other (See Comments)    Goes manic  . Lyrica [Pregabalin] Other (See Comments)    Mania  . Toradol [Ketorolac Tromethamine] Other (See Comments)    Reacts with  other medications; also, fever and mania  . Other Other (See Comments)    Antidepressants   :   His Current Medications Are:  Outpatient Encounter Prescriptions as of 02/27/2016  Medication Sig  . aspirin-acetaminophen-caffeine (EXCEDRIN MIGRAINE) 250-250-65 MG per tablet Take 1 tablet by mouth every 6 (six) hours as needed for migraine.   . Camphor-Menthol-Methyl Sal (TIGER BALM MUSCLE RUB EX) Apply 1 application topically as needed (for pain).  . cyclobenzaprine (FLEXERIL) 10 MG tablet Take 10 mg by mouth every 8 (eight) hours as needed (pain).   . rizatriptan (MAXALT-MLT) 5 MG disintegrating tablet Take 1 tablet (5 mg total) by mouth as needed for migraine. May repeat in 2 h if needed, no more than 2 pills/24 h, no more than 3 pills/w.  . [DISCONTINUED] HYDROmorphone (DILAUDID) 4 MG tablet Take 4 mg by mouth every 6 (six) hours as needed for severe pain.   Facility-Administered Encounter Medications as of 02/27/2016  Medication  . mupirocin ointment (BACTROBAN) 2 % 1 application  :  Review of Systems:  Out of a complete 14 point review of systems, all are reviewed and negative with the exception of these symptoms as listed below:    Review of Systems  Neurological:       Patient reports having headaches for past 3 years. Sometimes Excedrin helps, but headaches are lasting longer.  Patient has had auditory and visual hallucinations (with Bi-polar diagnosis) but seem to increase when he has headaches.  Memory loss.     Objective:  Neurologic Exam  Physical Exam Physical Examination:   Filed Vitals:   02/27/16 0926  BP: 188/122  Pulse: 72  Resp: 18   General Examination: The patient is a very pleasant 50 y.o. male in no acute distress. He appears well-developed and well-nourished and well groomed. Denies chest pain, shortness of breath, headache or blurry vision.  HEENT: Normocephalic, atraumatic, pupils are equal, round and reactive to light and accommodation. Funduscopic exam  is normal with sharp disc margins noted. Extraocular tracking is good without limitation to gaze excursion or nystagmus noted. Normal smooth pursuit is noted. Hearing is grossly intact. Face is symmetric with normal facial animation and normal facial sensation. Speech is clear with no dysarthria noted. There is no hypophonia. There is no lip, neck/head, jaw or voice tremor. Neck is supple with full range of passive and active motion. There are no  carotid bruits on auscultation. Oropharynx exam reveals: mild mouth dryness, adequate dental hygiene and moderate airway crowding, due to smaller airway entry, tonsils in place, thicker tongue. Mallampati is class II. Tongue protrudes centrally and palate elevates symmetrically.   Chest: Clear to auscultation without wheezing, rhonchi or crackles noted.  Heart: S1+S2+0, regular and normal without murmurs, rubs or gallops noted.   Abdomen: Soft, non-tender and non-distended with normal bowel sounds appreciated on auscultation.  Extremities: There is no pitting edema in the distal lower extremities bilaterally. Pedal pulses are intact.  Skin: Warm and dry without trophic changes noted. There are no varicose veins.  Musculoskeletal: exam reveals no obvious joint deformities, tenderness or joint swelling or erythema.   Neurologically:  Mental status: The patient is awake, alert and oriented in all 4 spheres. His immediate and remote memory, attention, language skills and fund of knowledge are appropriate. There is no evidence of aphasia, agnosia, apraxia or anomia. Speech is clear with normal prosody and enunciation. Thought process is linear. Mood is normal and affect is normal.  Cranial nerves II - XII are as described above under HEENT exam. In addition: shoulder shrug is normal with equal shoulder height noted. Motor exam: Normal bulk, strength and tone is noted. There is no drift, tremor or rebound. Romberg is negative. Reflexes are 2+ throughout.  Babinski: Toes are flexor bilaterally. Fine motor skills and coordination: intact with normal finger taps, normal hand movements, normal rapid alternating patting, normal foot taps and normal foot agility.  Cerebellar testing: No dysmetria or intention tremor on finger to nose testing. Heel to shin is unremarkable bilaterally. There is no truncal or gait ataxia.  Sensory exam: intact to light touch, pinprick, vibration, temperature sense in the upper and lower extremities.  Gait, station and balance: He stands easily. No veering to one side is noted. No leaning to one side is noted. Posture is age-appropriate and stance is narrow based. Gait shows normal stride length and normal pace. No problems turning are noted. He turns en bloc. Tandem walk is unremarkable. Intact toe and heel stance is noted.               Assessment and Plan:  In summary, Jafeth Mustin is a very pleasant 50 y.o.-year old male with an underlying medical history of arthritis, degenerative spine disease, status post ACDF, status post multiple neck surgeries, status post concussion on several occasions per verbal report, secondary to playing football in high school and other contact sports, doing martial arts, depression, and anxiety, with a diagnosis of bipolar of many years, kidney stones, history of hepatitis, and mildly overweight state, whose history is in keeping with migraine with aura, exacerbated or confounded by his history of multiple neck surgeries, history of concussions in the past. I talked to the patient and his wife at length today. His exam is nonfocal which is very reassuring. He may have had a brain MRI some 3 years ago when he saw Dr. Adella Hare in [place]. I have requested that patient obtain records and that I reviewed them. He may have tried preventative medication for migraines. It does not sound like he has ever tried a triptan. I would like for him to try Maxalt as needed. I talked to him about potential side effects  and limitations of the medication. We talked about potentially utilizing a prevention type medication as well. At this juncture, he is advised to use Excedrin as needed and Flexeril as needed but it is sedating. He is furthermore  advised about headache triggers. We talked about medical treatments and non-pharmacological approaches. We talked about maintaining a healthy lifestyle in general. I encouraged the patient to eat healthy, exercise daily and keep well hydrated, to keep a scheduled bedtime and wake time routine, to not skip any meals and eat healthy snacks in between meals and to have protein with every meal.   I advised the patient about common headache triggers: sleep deprivation, dehydration, overheating, stress, hypoglycemia or skipping meals and blood sugar fluctuations, excessive pain medications or excessive alcohol use or caffeine withdrawal. Some people have food triggers such as aged cheese, orange juice or chocolate, especially dark chocolate, or MSG (monosodium glutamate). He is to try to avoid these headache triggers as much possible. It may be helpful to keep a headache diary to figure out what makes His headaches worse or brings them on and what alleviates them. Some people report headache onset after exercise but studies have shown that regular exercise may actually prevent headaches from coming. If He has exercise-induced headaches, He is advised to drink plenty of fluid before and after exercising and that to not overdo it and to not overheat.  As far as further diagnostic testing is concerned, we may consider repeating a brain MRI if possible or head CT if MRI not possible. I would like to review his prior scan results. Furthermore, I have advised his wife to watch out for snoring and apneic breathing pauses while asleep. He does appear to have a somewhat crowded airway although he is not obese or have a large neck size.  I  provided him with a prescription for Maxalt as needed. We will  monitor his memory complaints, may consider referral for cognitive testing with a neuropsychologist down the Road. I would like to see him back in 3 months, sooner if needed. I answered all their questions today and he was in agreement.  Thank you very much for allowing me to participate in the care of this nice patient. If I can be of any further assistance to you please do not hesitate to call me at [phone].  Sincerely,   Huston Foley, MD, PhD

## 2016-02-27 NOTE — Patient Instructions (Addendum)
Please remember, common headache triggers are: sleep deprivation, dehydration, overheating, stress, hypoglycemia or skipping meals and blood sugar fluctuations, excessive pain medications or excessive alcohol use or caffeine withdrawal. Some people have food triggers such as aged cheese, orange juice or chocolate, especially dark chocolate, or MSG (monosodium glutamate). Try to avoid these headache triggers as much possible. It may be helpful to keep a headache diary to figure out what makes your headaches worse or brings them on and what alleviates them. Some people report headache onset after exercise but studies have shown that regular exercise may actually prevent headaches from coming. If you have exercise-induced headaches, please make sure that you drink plenty of fluid before and after exercising and that you do not over do it and do not overheat.   We may consider a headache prevention medication in the near future.   We may do a brain MRI in the near future, please try to get your old records from Dr. Applegate's office.   Please ask your wife if you snore and if so, how loud it is, and if you have breathing related issues in your sleep, such as: snorting sounds, choking sounds, pauses in your breathing or shallow breathing events. These may be symptoms of obstructive sleep apnea (OSA).   Maxalt orally disintegrating tab, 5 mg: take 1 pill early on when you suspect a migraine attack come on. You may take another pill within 2 hours, no more than 2 pills in 24 hours. Most people who take triptans do not have any serious side-effects. However, they can cause drowsiness (remember to not drive or use heavy machinery when drowsy), nausea, dizziness, dry mouth. Less common side effects include strange sensations, such as tightness in your chest or throat, tingling, flushing, and feelings of heaviness or pressure in areas such as the face, limbs, and chest. These in the chest can mimic heart related pain  (angina) and may cause alarm, but usually these sensations are not harmful or a sign of a heart attack. However, if you develop intense chest pain or sensations of discomfort, you should stop taking your medication and consult with me or your PCP or go to the nearest urgent care facility or ER or call 911.   We may consider trigger point injections or botox injections in the future.

## 2016-03-02 ENCOUNTER — Telehealth: Payer: Self-pay | Admitting: Neurology

## 2016-03-02 NOTE — Telephone Encounter (Signed)
I spoke to wife. She states that she dropped patient off [facility] today, he is still there. We were able to make f/u appt for patient tomorrow per wife's request. I asked her to have the hospital fax us their notes and gave our fax number.

## 2016-03-02 NOTE — Telephone Encounter (Signed)
Patient request, faxed to [facility] requesting medical records.

## 2016-03-02 NOTE — Telephone Encounter (Signed)
Wife called to advise, patient has had headache since he was here for appointment Thursday June 8th. Please call [phone] or [phone].

## 2016-03-02 NOTE — Telephone Encounter (Signed)
Wife Nehemiah SettleBrooke called regarding medical records request. Please call [phone] or [phone].

## 2016-03-02 NOTE — Telephone Encounter (Signed)
Pt called in and says that his headache has gotten worse, with increase pain and aura now. He said he is worried that it may be something else, the pain started in his neck. [facility] said [facility] is [facility]

## 2016-03-03 ENCOUNTER — Encounter: Payer: Self-pay | Admitting: Neurology

## 2016-03-03 ENCOUNTER — Ambulatory Visit (INDEPENDENT_AMBULATORY_CARE_PROVIDER_SITE_OTHER): Payer: Medicaid Other | Admitting: Neurology

## 2016-03-03 VITALS — BP 168/108 | HR 80 | Resp 18 | Ht 70.0 in | Wt 177.0 lb

## 2016-03-03 DIAGNOSIS — G4486 Cervicogenic headache: Secondary | ICD-10-CM

## 2016-03-03 DIAGNOSIS — R030 Elevated blood-pressure reading, without diagnosis of hypertension: Secondary | ICD-10-CM | POA: Diagnosis not present

## 2016-03-03 DIAGNOSIS — G43109 Migraine with aura, not intractable, without status migrainosus: Secondary | ICD-10-CM | POA: Diagnosis not present

## 2016-03-03 DIAGNOSIS — R51 Headache: Secondary | ICD-10-CM | POA: Diagnosis not present

## 2016-03-03 DIAGNOSIS — IMO0001 Reserved for inherently not codable concepts without codable children: Secondary | ICD-10-CM

## 2016-03-03 NOTE — Progress Notes (Signed)
Subjective:    Patient ID: Corey Gilbert is a 50 y.o. male.  HPI     Interim history:   Corey Gilbert is a 50 year old right-handed gentleman with an underlying medical history of arthritis, degenerative spine disease, status post ACDF, status post multiple neck surgeries, status post concussion on several occasions (per patient's verbal report, d/t to playing football in high school and other contact sports, doing martial arts), depression, and anxiety, with a diagnosis of bipolar of many years (not on meds any longer d/t SEs, sees counselor prn), kidney stones, history of hepatitis, and mildly overweight state, who presents for a sooner than scheduled appointment after his recent hospitalization. The patient is accompanied by his wife and son today. I first met him recently on 02/27/2016 at the request of Dr. Joya Gilbert, at which time the patient reported a history of recurrent headaches for about 3 years. I suggested we review prior office notes from Dr. Metta Gilbert and MRI test results from before as well. I suggested when necessary use of Maxalt. In the interim, patient went to the emergency room for worsening headaches.  Today, 03/03/2016: He reports feeling better.He does not think that Maxalt has been helpful, even after taking it 2 times within 2 hours. His blood pressure is elevated. He is not on any blood pressure medication. He has not seen his primary care physician in over one year. I have not received any records from Dr. Applegate's office. We will call his office again today. I reviewed her discharge paperwork he was given a [facility] emergency department. He was treated with Reglan, Benadryl and Dilaudid and improved. He was seen by Ms. Corey Persons, PA. He was advised to use Maxalt as needed and oxycodone as needed. He had a several day Hx of migraine and neck pain prior to going to the ER. He sees Dr. Joya Gilbert or his PA every 2 weeks for a refill on his hydrocodone. He averages about 4 pills a day.    Previously:   02/27/2016: He reports a history of recurrent headaches for the past 3 years or so. Headaches are pressure like as well as throbbing and usually start in the back of the head and the posterior neck area and travels forwards affecting both temples, typically left side a little worse than right. Associated symptoms or photophobia, sonophobia, nausea, rare vomiting, and blurry vision. Sometimes or often rather he reports having an aura of decrease in visual field and tunnel vision at times with some scintillating scotoma reported or described. He has no overt family history of migraines and no remote history of migraines growing up. He has a prior diagnosis of bipolar disorder and was tried on several medications in the past including Depakote which caused an exacerbation in his hallucinations, he could not tolerate Lyrica or gabapentin, he was apparently also tried on Topamax but is not sure. He could not tolerate any of the SSRI type medications as I understand but is not able to name me the exact ones that he tried. He saw some 3 years ago Dr. Metta Gilbert in [place] and may have had a brain MRI at the time. I do not have any records available for review from his office. The patient is advised to try to get records from Dr. Applegate's office. He may have tried amitriptyline at the time and some blood pressure medication, perhaps verapamil . He does not tend to snore and does not have any apneas per wife. It helps to lie down in a darkened room.  Sometimes he takes Flexeril at night but it makes him quite sleepy. He is on oxycodone 10 mg strength as needed and tends to take about one pill every other day at this time. He is a nonsmoker, rarely smokes marijuana, and drinks about 1-2 cups of coffee per day, is trying to reduce his soda intake. He takes Excedrin as needed, averaging about once a week. Altogether, his headache frequency is erratic. He can go a week without a headache, altogether, he  believes he gets about 10 days of headaches per average month at this time. He lives with his wife and 44 year old son. He does not work. He has prescription eyeglasses. He does not report any neurological accompaniment while having a headache or headache aura such as one-sided weakness, numbness, slurring of speech or droopy face.   He does report a history of memory issues and not being able to concentrate very well, particularly when he has a headache. I reviewed your office notes from, which you kindly included. He had a CT cervical spine with contrast on 12/19/2015 and and a myelogram, and I reviewed the report: IMPRESSION: 1. Incomplete purchase of the superior screw associated with the half plate at L9-7. The screw is not covered with bone along its inferior margin at the inferior endplate of C6. 2. Slight lucency about the screws bilaterally at C7 suggesting some loosening. 3. Slight anterolisthesis at C6-7 non plan from radiographs with the patient prone and on the upright images. This is slightly worse with flexion. The anterolisthesis is reduced on the CT scan with the patient in the supine position. 4. Soft tissue narrowing of the left central canal and foramen at C6-7 may reflect disc material or granulation tissue. 5. The central canal is decompressed at C4-5 and C5-6 following previous surgery. 6. Mild foraminal narrowing bilaterally at C4-5 is stable. 7. Moderate left and mild right foraminal stenosis at C3-4 is stable. 8. Mild left foraminal narrowing at C2-3 is stable.    His Past Medical History Is Significant For: Past Medical History  Diagnosis Date  . Near syncope     due to opain, last time  early Sept 2013  . Depression   . Concussion   . Kidney stones   . Arthritis   . Family history of anesthesia complication     pon& v  . Bipolar disorder ([place])   . Anxiety   . Headache   . Hepatitis     Hepatitis C - has been treated with Harvoni  . Complication of  anesthesia   . PONV (postoperative nausea and vomiting)     not on most recent surgery  . Bipolar 1 disorder ([place])   . Brain injuries [facility])     His Past Surgical History Is Significant For: Past Surgical History  Procedure Laterality Date  . Wrist surgery  2013    left at [facility]  . Wrist surgery  2000    right  . Lumbar laminectomy/decompression microdiscectomy  06/14/2012    Procedure: LUMBAR LAMINECTOMY/DECOMPRESSION MICRODISCECTOMY 1 LEVEL;  Surgeon: Floyce Stakes, MD;  Location: [place] NEURO ORS;  Service: Neurosurgery;  Laterality: Left;  Left Lumbar Removal of Fragment/Laminectomy Lumbar Three-Four  . Back surgery  2013    at [facility]  . Sp arthro thumb*l*  2013  left  2000 right    bilateral tendon  release.   Marland Kitchen Anterior cervical decomp/discectomy fusion  09/27/2012    Procedure: ANTERIOR CERVICAL DECOMPRESSION/DISCECTOMY FUSION 2 LEVELS;  Surgeon: Floyce Stakes,  MD;  Location: [place] NEURO ORS;  Service: Neurosurgery;  Laterality: N/A;  Cervical four-five, Cervical five-six,  Anterior cervical decompression/diskectomy/fusion/plate  . Lumbar wound debridement N/A 02/16/2015    Procedure: LUMBAR WOUND DEBRIDEMENT;  Surgeon: Ashok Pall, MD;  Location: [place] NEURO ORS;  Service: Neurosurgery;  Laterality: N/A;  . Lithotripsy    . Anterior cervical decomp/discectomy fusion N/A 11/13/2015    Procedure: C6-7 Anterior cervical decompression/diskectomy/fusion;  Surgeon: Leeroy Cha, MD;  Location: [place] NEURO ORS;  Service: Neurosurgery;  Laterality: N/A;  C6-7 Anterior cervical decompression/diskectomy/fusion  . Anterior cervical decomp/discectomy fusion N/A 11/29/2015    Procedure: Removal of anterior cervical plate and Insertion of Half-Plate Caudal;  Surgeon: Leeroy Cha, MD;  Location: [place] NEURO ORS;  Service: Neurosurgery;  Laterality: N/A;  Removal of anterior cervical plate and Insertion of Half-Plate Caudal    His Family History Is Significant For: Family History  Problem Relation Age of  Onset  . Arrhythmia Mother     His Social History Is Significant For: Social History   Social History  . Marital Status: Married    Spouse Name: N/A  . Number of Children: 1  . Years of Education: college   Occupational History  . Unemployed [place]    Social History Main Topics  . Smoking status: Never Smoker   . Smokeless tobacco: Never Used  . Alcohol Use: No     Comment: rarely  . Drug Use: Yes    Special: Marijuana, Oxycodone     Comment: uses marijuana to help bipolar  . Sexual Activity: Yes   Other Topics Concern  . None   Social History Narrative   Lives in wife and son in [place].    Drinks about 1 cup of coffee a day     His Allergies Are:  Allergies  Allergen Reactions  . Acetaminophen Other (See Comments)    Avoids due to history of Hepatitis C  . Baclofen Other (See Comments)    Goes manic  . Gabapentin Other (See Comments)    Goes manic  . Ibuprofen Other (See Comments)    Goes manic  . Lyrica [Pregabalin] Other (See Comments)    Mania  . Toradol [Ketorolac Tromethamine] Other (See Comments)    Reacts with other medications; also, fever and mania  . Other Other (See Comments)    Antidepressants   :   His Current Medications Are:  Outpatient Encounter Prescriptions as of 03/03/2016  Medication Sig  . aspirin-acetaminophen-caffeine (EXCEDRIN MIGRAINE) [phone] MG per tablet Take 1 tablet by mouth every 6 (six) hours as needed for migraine.   . Camphor-Menthol-Methyl Sal (TIGER BALM MUSCLE RUB EX) Apply 1 application topically as needed (for pain).  . cyclobenzaprine (FLEXERIL) 10 MG tablet Take 10 mg by mouth every 8 (eight) hours as needed (pain).   . rizatriptan (MAXALT-MLT) 5 MG disintegrating tablet Take 1 tablet (5 mg total) by mouth as needed for migraine. May repeat in 2 h if needed, no more than 2 pills/24 h, no more than 3 pills/w.   Facility-Administered Encounter Medications as of 03/03/2016  Medication  . mupirocin  ointment (BACTROBAN) 2 % 1 application  :  Review of Systems:  Out of a complete 14 point review of systems, all are reviewed and negative with the exception of these symptoms as listed below:   Review of Systems  Neurological:       Patient was seen in [facility] ED yesterday for a migraine. They gave him a migraine cocktail  of Reglan 56m, Benadryl 210m and Dilaudid 40m74mHe states that it did help. Still has some pain in the back of the head.   Patient reports that Maxalt has not helped him.     Objective:  Neurologic Exam  Physical Exam Physical Examination:   Filed Vitals:   03/03/16 1139  BP: 168/108  Pulse: 80  Resp: 18   General Examination: The patient is a very pleasant 50 34o. male in no acute distress. He appears well-developed and well-nourished and well groomed. Denies chest pain, shortness of breath, headache or blurry vision.  HEENT: Normocephalic, atraumatic, pupils are equal, round and reactive to light and accommodation. Funduscopic exam is normal with sharp disc margins noted. Extraocular tracking is good without limitation to gaze excursion or nystagmus noted. Normal smooth pursuit is noted. Hearing is grossly intact. Face is symmetric with normal facial animation and normal facial sensation. Speech is clear with no dysarthria noted. There is no hypophonia. There is no lip, neck/head, jaw or voice tremor. Neck is supple with full range of passive and active motion. There are no carotid bruits on auscultation. Oropharynx exam reveals: mild mouth dryness, adequate dental hygiene and moderate airway crowding, due to smaller airway entry, tonsils in place, thicker tongue. Mallampati is class II. Tongue protrudes centrally and palate elevates symmetrically.   Chest: Clear to auscultation without wheezing, rhonchi or crackles noted.  Heart: S1+S2+0, regular and normal without murmurs, rubs or gallops noted.   Abdomen: Soft, non-tender and non-distended with normal bowel  sounds appreciated on auscultation.  Extremities: There is no pitting edema in the distal lower extremities bilaterally. Pedal pulses are intact.  Skin: Warm and dry without trophic changes noted. There are no varicose veins.  Musculoskeletal: exam reveals no obvious joint deformities, tenderness or joint swelling or erythema.   Neurologically:  Mental status: The patient is awake, alert and oriented in all 4 spheres. His immediate and remote memory, attention, language skills and fund of knowledge are appropriate. There is no evidence of aphasia, agnosia, apraxia or anomia. Speech is clear with normal prosody and enunciation. Thought process is linear. Mood is normal and affect is normal.  Cranial nerves II - XII are as described above under HEENT exam. In addition: shoulder shrug is normal with equal shoulder height noted. Motor exam: Normal bulk, strength and tone is noted. There is no drift, tremor or rebound. Romberg is negative. Reflexes are 2+ throughout. Fine motor skills and coordination: intact in the UEs and LEs.   Cerebellar testing: No dysmetria or intention tremor on finger to nose testing. Heel to shin is unremarkable bilaterally. There is no truncal or gait ataxia.  Sensory exam: intact to light touch in the upper and lower extremities.  Gait, station and balance: He stands easily. No veering to one side is noted. No leaning to one side is noted. Posture is age-appropriate and stance is narrow based. Gait shows normal stride length and normal pace. No problems turning are noted. He turns en bloc. Tandem walk is unremarkable.   Assessment and Plan:  In summary, AlaSharrieff Spratlin a very pleasant 50 41ar old male with an underlying medical history of arthritis, degenerative spine disease, status post ACDF, status post multiple neck surgeries, status post concussion on several occasions (per patient's verbal report, d/t to playing football in high school and other contact sports, doing  martial arts), depression, and anxiety, with a diagnosis of bipolar of many years (not on meds any longer d/t SEs, sees counselor prn),  kidney stones, history of hepatitis, and mildly overweight state, who Presents for a sooner than scheduled appointment due to the emergency room visit yesterday for prolonged migraine. He was treated symptomatically with IV Benadryl, Reglan and Dilaudid. He improved. His exam continues to be nonfocal. His blood pressure continues to be elevated and has been elevated for some time. He is not on any blood pressure medication. He is reluctant to consider medication for blood pressure control but I explained to him that his elevated blood pressure may be a contributor to recurrent headaches. 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 He continues to be on hydrocodone about 4 pills per day and gets refills on this from Dr. Harley Hallmark office as well as ondansetron. He tried Maxalt but it did not help he feels. I suggested that he hold off on using it and I did not suggest any different triptan for fear of exacerbation of his hypertension. I explained my findings to him. We talked about migraine prevention treatment but he is reluctant to start anything for this. We will request records from Dr. Applegate's office. I suggested we could try verapamil or propranolol but he says that he already tried these 2 medications. He may be a candidate for Botox injections or trigger point injections. I suggested we proceed with a brain MRI with and without contrast.  We talked about headache triggers again today. He is advised to stay well hydrated and well rested and try not to eat processed food. I would like to review his prior brain scan results. I asked him to keep his appointment in September 2017.  I answered all their questions today and he and his wife were in agreement.  I spent 25 minutes  in total face-to-face time with the patient, more than 50% of which was spent in counseling and coordination of care, reviewing test results, reviewing medication and discussing or reviewing the diagnosis of recurrent migraines, cervicogenic headaches, elevated blood pressure and hypertension, the prognosis and treatment options.

## 2016-03-03 NOTE — Patient Instructions (Signed)
Please remember, common headache triggers are: sleep deprivation, dehydration, overheating, stress, hypoglycemia or skipping meals and blood sugar fluctuations, excessive pain medications or excessive alcohol use or caffeine withdrawal. Some people have food triggers such as aged cheese, orange juice or chocolate, especially dark chocolate, or MSG (monosodium glutamate). Try to avoid these headache triggers as much possible. It may be helpful to keep a headache diary to figure out what makes your headaches worse or brings them on and what alleviates them. Some people report headache onset after exercise but studies have shown that regular exercise may actually prevent headaches from coming. If you have exercise-induced headaches, please make sure that you drink plenty of fluid before and after exercising and that you do not over do it and do not overheat.  We will consider migraine prevention medication, but you asked to hold off.   Your blood pressure is consistently high - you need to be treated for this, please follow up with your primary care physician, as your blood pressure being high may be causing your headaches too!  Hold off on the maxalt, as it did not help and may further increase your blood pressure.

## 2016-03-12 ENCOUNTER — Other Ambulatory Visit: Payer: Self-pay | Admitting: Neurosurgery

## 2016-03-12 DIAGNOSIS — M5412 Radiculopathy, cervical region: Secondary | ICD-10-CM

## 2016-03-13 ENCOUNTER — Ambulatory Visit
Admission: RE | Admit: 2016-03-13 | Discharge: 2016-03-13 | Disposition: A | Discharge status: Home / Self Care | Payer: Medicaid Other | Source: Ambulatory Visit | Attending: Neurology | Admitting: Neurology

## 2016-03-13 ENCOUNTER — Ambulatory Visit
Admission: RE | Admit: 2016-03-13 | Discharge: 2016-03-13 | Disposition: A | Discharge status: Home / Self Care | Payer: Medicaid Other | Source: Ambulatory Visit | Attending: Neurosurgery | Admitting: Neurosurgery

## 2016-03-13 DIAGNOSIS — IMO0001 Reserved for inherently not codable concepts without codable children: Secondary | ICD-10-CM

## 2016-03-13 DIAGNOSIS — R030 Elevated blood-pressure reading, without diagnosis of hypertension: Secondary | ICD-10-CM

## 2016-03-13 DIAGNOSIS — G43109 Migraine with aura, not intractable, without status migrainosus: Secondary | ICD-10-CM | POA: Diagnosis not present

## 2016-03-13 DIAGNOSIS — R51 Headache: Secondary | ICD-10-CM

## 2016-03-13 DIAGNOSIS — G4486 Cervicogenic headache: Secondary | ICD-10-CM

## 2016-03-13 DIAGNOSIS — M5412 Radiculopathy, cervical region: Secondary | ICD-10-CM

## 2016-03-13 MED ORDER — TRIAMCINOLONE ACETONIDE 40 MG/ML IJ SUSP (RADIOLOGY)
60.0000 mg | Freq: Once | INTRAMUSCULAR | Status: AC
  Administered 2016-03-13: 60 mg via EPIDURAL

## 2016-03-13 MED ORDER — IOHEXOL 300 MG/ML  SOLN
1.0000 mL | Freq: Once | INTRAMUSCULAR | Status: AC | PRN
  Administered 2016-03-13: 1 mL via EPIDURAL

## 2016-03-13 MED ORDER — GADOBENATE DIMEGLUMINE 529 MG/ML IV SOLN
15.0000 mL | Freq: Once | INTRAVENOUS | Status: AC | PRN
  Administered 2016-03-13: 15 mL via INTRAVENOUS

## 2016-03-13 NOTE — Discharge Instructions (Signed)

## 2016-03-16 ENCOUNTER — Telehealth: Payer: Self-pay | Admitting: Neurology

## 2016-03-16 NOTE — Telephone Encounter (Signed)
I called patient back. States that he started blood pressure on Thursday- lisinopril and his BP has come down. Had steroid injections in neck on Friday. Physician who did the injections is out of the office this week. Patient states that during the procedure some thing was mentioned about a bracket in the patient's neck. Patient reports that he has had a broken bracket in his neck before and is wondering if you will order an x-ray to make sure nothing has come undone. He is still having headaches and episodes of light headedness. Patient did not feel that he needs to come in or go to ED. He states that he just wants to get x-ray. He is aware you are out of the office today and I will call him back tomorrow.

## 2016-03-16 NOTE — Telephone Encounter (Signed)
Patient called to advise, "had neck injections and MRI on Friday, headaches have come back, headaches are immense".

## 2016-03-17 ENCOUNTER — Telehealth: Payer: Self-pay | Admitting: Neurology

## 2016-03-17 ENCOUNTER — Telehealth: Payer: Self-pay

## 2016-03-17 DIAGNOSIS — R51 Headache: Secondary | ICD-10-CM

## 2016-03-17 DIAGNOSIS — G8929 Other chronic pain: Principal | ICD-10-CM

## 2016-03-17 DIAGNOSIS — M542 Cervicalgia: Secondary | ICD-10-CM

## 2016-03-17 DIAGNOSIS — R519 Headache, unspecified: Secondary | ICD-10-CM

## 2016-03-17 NOTE — Telephone Encounter (Signed)
Dr. Lucia GaskinsAhern, please see notes and phone notes on this patient. Would you be willing to review chart and we also have old records from Dr. Adella HareApplegate. If there is anything you can add to his care, would you be willing to see him?  Of note, patient had seen Dr. Adella HareApplegate in the past and had tried multiple preventative medications. He was also seen from what I understand at [facility] and does not wish to go back there. I believe a concrete option may be to see Dr. Marshell GarfinkelFinkel in [place], of course we could also suggest headache clinics in the area such as Dr. Santiago GladMarshall Freeman or Dr. Vela ProseLewitt? This would be up to patient and his PCP. Please give me your input, I highly appreciate it!

## 2016-03-17 NOTE — Telephone Encounter (Signed)
See other phone notes.

## 2016-03-17 NOTE — Telephone Encounter (Signed)
LM for patient letting him know that we have put in order for x-ray amd he should be getting a call soon to set that up.

## 2016-03-17 NOTE — Telephone Encounter (Signed)
Patient called to practice and wanted to speak with a Production designer, theatre/television/filmmanager. I spoke with patient. He would like to change doctors in practice. He likes Dr. 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 He feels he does not need an xray of neck. He was put on BP pressure medicine and his cardiologist told him he did not need this. His pressure was high due to his pain.

## 2016-03-17 NOTE — Progress Notes (Signed)
Quick Note:  MRI Brain from 03/13/16 within normal limits, please notify patient. Will order neck X ray.  Huston FoleySaima Dantavious Snowball, MD, PhD [facility] ([facility])  ______

## 2016-03-17 NOTE — Telephone Encounter (Signed)
X-ray neck ordered
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
## 2016-03-17 NOTE — Telephone Encounter (Signed)
-----   Message from Huston FoleySaima Athar, MD sent at 03/17/2016  9:02 AM EDT ----- MRI Brain from 03/13/16 within normal limits, please notify patient. Will order neck X ray.  Huston FoleySaima Athar, MD, PhD [facility])

## 2016-03-17 NOTE — Telephone Encounter (Signed)
Patient called back and stated he has already had an x-ray and stated he would be making an effort to get things coordinated between doctors.

## 2016-03-17 NOTE — Telephone Encounter (Signed)
Lm for patient to go by [facility] to get x-ray done. I left their address, phone number, and hours of operation.

## 2016-03-17 NOTE — Telephone Encounter (Signed)
Corey GoldsmithAlan Jaquez called and LM on MY VM. He is a patient of Dr Johny SaxAthars and was to have an [facility] at [facility] and he thinks it is not necessary. He needs to talk with someone. Please call Thanks dg

## 2016-03-17 NOTE — Telephone Encounter (Signed)
Spoke with Lafonda Mossesiana. She will call patient and give him results of MRI and let him know the referral advice from Dr.Athar.

## 2016-03-17 NOTE — Telephone Encounter (Signed)
Thank you for the update. In phone note from yesterday, patient asked me several times to have Dr. Frances FurbishAthar order an x-ray of his neck. He was made aware that she was out of the office yesterday and stated that he was ok with her placing the order when she got back. I am unsure what caused his change of mind?   As far as his blood pressure, his office notes will show that he was advised several times to talk with PCP about starting medication. This can contribute to his headaches and puts him at risk for stroke. When I spoke with him yesterday he said that he just recently started lisinopril and his BP is down. Pain and recent steroid injections may play a factor in recent BP increase.   In speaking with Dr. Frances FurbishAthar, patient may benefit from having PCP refer him to [facility] [place]. Dr. Wandra ArthursAlan Finkel specializes in concussions. P# [phone].

## 2016-03-18 NOTE — Telephone Encounter (Signed)
Dr. Lucia GaskinsAhern: thank you very much for your input.  Diana: Pls call patient back with the recommendation, that his best option is a headache clinic locally or [facility] clinic [facility] [place], best to discuss options with PCP, who can then make a referral as per mutual preference. Dr. Lucia GaskinsAhern is our headache specialist in our group and reviewed his chart and did not feel she would be adding anything else at this time.

## 2016-03-18 NOTE — Telephone Encounter (Signed)
LM with information below. Left call back number if he would like to discuss further

## 2016-03-18 NOTE — Telephone Encounter (Signed)
Dr. Frances FurbishAthar, I have thoroughly reviewed patient's records and unfortunately at this time I do not think I have anything that I could add to his already extensive evaluation and previous treatment plans. Patient should possibly be seen at a specialized headache clinic or academic headache clinic. Thank you.

## 2016-03-30 NOTE — Telephone Encounter (Signed)
Pt called to discuss referral. I relayed what Dr. Frances FurbishAthar suggested and he expressed understanding.

## 2016-04-13 ENCOUNTER — Encounter (HOSPITAL_COMMUNITY): Payer: Self-pay | Admitting: *Deleted

## 2016-04-13 DIAGNOSIS — M5412 Radiculopathy, cervical region: Secondary | ICD-10-CM | POA: Diagnosis not present

## 2016-04-13 DIAGNOSIS — Z7982 Long term (current) use of aspirin: Secondary | ICD-10-CM | POA: Insufficient documentation

## 2016-04-13 DIAGNOSIS — R200 Anesthesia of skin: Secondary | ICD-10-CM | POA: Insufficient documentation

## 2016-04-13 DIAGNOSIS — Z79899 Other long term (current) drug therapy: Secondary | ICD-10-CM | POA: Diagnosis not present

## 2016-04-13 DIAGNOSIS — M542 Cervicalgia: Secondary | ICD-10-CM | POA: Diagnosis present

## 2016-04-13 MED ORDER — OXYCODONE-ACETAMINOPHEN 5-325 MG PO TABS
1.0000 | ORAL_TABLET | Freq: Once | ORAL | Status: AC
Start: 2016-04-13 — End: 2016-04-13
  Administered 2016-04-13: 1 via ORAL

## 2016-04-13 MED ORDER — OXYCODONE-ACETAMINOPHEN 5-325 MG PO TABS
ORAL_TABLET | ORAL | Status: AC
  Filled 2016-04-13: qty 1

## 2016-04-13 NOTE — ED Triage Notes (Signed)
Pt c/o left ring finger numbness radiating up right arm to neck starting around 2 pm today after waking up from a nap. Pt has hx of recent neck surgeries. Dr. Jeral Fruit told pt to come to ED for numbness and pain management. Pt last dose of pain medication was around 1200 pm 15 mg oxycodone.

## 2016-04-14 ENCOUNTER — Emergency Department (HOSPITAL_COMMUNITY)
Admission: EM | Admit: 2016-04-14 | Discharge: 2016-04-14 | Discharge status: Against Medical Advice | Payer: Medicaid Other | Attending: Emergency Medicine | Admitting: Emergency Medicine

## 2016-04-14 DIAGNOSIS — M5412 Radiculopathy, cervical region: Secondary | ICD-10-CM

## 2016-04-14 DIAGNOSIS — R200 Anesthesia of skin: Secondary | ICD-10-CM

## 2016-04-14 MED ORDER — HYDROMORPHONE HCL 1 MG/ML IJ SOLN
2.0000 mg | Freq: Once | INTRAMUSCULAR | Status: AC
  Administered 2016-04-14: 2 mg via INTRAMUSCULAR
  Filled 2016-04-14: qty 2

## 2016-04-14 NOTE — ED Notes (Signed)
Pt. decided to receive Dilaudid IM 2 mg.

## 2016-04-14 NOTE — ED Notes (Signed)
Pt. left the ER without waiting for his discharge papers , he confronted the EDP on his way out and argued with the EDP regarding his pain medication .

## 2016-04-14 NOTE — ED Provider Notes (Signed)
[facility]-EMERGENCY DEPT Provider Note   CSN: [id number] Arrival date & time: 04/13/16  2112  First Provider Contact:  None    By signing my name below, I, Arianna Nassar, attest that this documentation has been prepared under the direction and in the presence of Derwood Kaplan, MD.  Electronically Signed: Octavia Heir, ED Scribe. 04/14/16. 5:30 AM.    History   Chief Complaint Chief Complaint  Patient presents with  . Numbness  . Neck Pain    The history is provided by the patient. No language interpreter was used.   HPI Comments: Corey Gilbert is a 50 y.o. male who presents to the Emergency Department complaining of constant, gradual worsening, moderate neck pain onset . He reports associated numbness onset yesterday in two fingers of his right arm. Pt has a hx of recent neck surgeries (last in May 2017) and notes that his neck has not felt the same since. He says that his surgeon Dr. Jeral Fruit told him to come to the ED for the numbness. Pt had a shot to the nexk yday, and his pain hasnt improved much. Pt notes his last dose of pain medication was 15 mg of oxycodone, which he takes every 4-6 hours scheduled. He denies leg pain, bowel/bladder incontinence, urinary retention or leg weakness. Pt has pins and needle sensation in his last 2 digits and feels numb in the entire hand with pain shooting down. Past Medical History:  Diagnosis Date  . Anxiety   . Arthritis   . Bipolar 1 disorder (HCC)   . Bipolar disorder (HCC)   . Brain injuries (HCC)   . Complication of anesthesia   . Concussion   . Depression   . Family history of anesthesia complication    pon& v  . Headache   . Hepatitis    Hepatitis C - has been treated with Harvoni  . Kidney stones   . Near syncope    due to opain, last time  early Sept 2013  . PONV (postoperative nausea and vomiting)    not on most recent surgery    Patient Active Problem List   Diagnosis Date Noted  . Elevated blood pressure 11/30/2015  .  Cervical stenosis of spinal canal 11/13/2015  . Accelerated hypertension postop C-spine surgery 11/13/2015  . Pre-operative cardiovascular examination 11/01/2015  . Hypoxia 11/01/2015  . DJD (degenerative joint disease), cervical 11/01/2015  . Degeneration of intervertebral disc of cervical region 11/01/2015  . Chest pain with moderate risk for cardiac etiology-low risk pre op Myoview 03/26/2015  . Shortness of breath at rest, after antibiotic therapy 03/26/2015  . Elevated troponin 03/26/2015  . Thrombocytopenia (HCC) 03/26/2015  . Hyponatremia 03/26/2015  . Chest pain syndrome 03/26/2015  . Nausea with vomiting 03/26/2015  . Chest pain   . Wound infection after surgery 02/16/2015  . Spondylolisthesis of lumbar region 01/23/2015  . Mild cognitive disorder 07/11/2014  . Amnesia 06/19/2014  . Atypical migraine 06/19/2014  . Anxiety 06/12/2014  . Gonalgia 12/01/2013  . Chronic pain following surgery or procedure 04/26/2013  . Polypharmacy 04/26/2013  . Lumbar and sacral osteoarthritis 03/28/2013  . Arthropathy of lumbar facet joint 02/14/2013  . Affective bipolar disorder (HCC) 01/06/2013  . Cervical post-laminectomy syndrome 01/06/2013  . 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 Bundle branch block, right 01/06/2013  . Cannabis abuse, continuous 10/01/2011  . Chronic hepatitis C (HCC) 11/19/2010    Past Surgical History:  Procedure Laterality  Date  . ANTERIOR CERVICAL DECOMP/DISCECTOMY FUSION  09/27/2012   Procedure: ANTERIOR CERVICAL DECOMPRESSION/DISCECTOMY FUSION 2 LEVELS;  Surgeon: Karn Cassis, MD;  Location: [facility];  Service: Neurosurgery;  Laterality: N/A;  Cervical four-five, Cervical five-six,  Anterior cervical decompression/diskectomy/fusion/plate  . ANTERIOR CERVICAL DECOMP/DISCECTOMY FUSION N/A 11/13/2015   Procedure: C6-7 Anterior cervical decompression/diskectomy/fusion;  Surgeon: Hilda Lias,  MD;  Location: [facility];  Service: Neurosurgery;  Laterality: N/A;  C6-7 Anterior cervical decompression/diskectomy/fusion  . ANTERIOR CERVICAL DECOMP/DISCECTOMY FUSION N/A 11/29/2015   Procedure: Removal of anterior cervical plate and Insertion of Half-Plate Caudal;  Surgeon: Hilda Lias, MD;  Location: [facility];  Service: Neurosurgery;  Laterality: N/A;  Removal of anterior cervical plate and Insertion of Half-Plate Caudal  . BACK SURGERY  2013   at [facility]  . LITHOTRIPSY    . LUMBAR LAMINECTOMY/DECOMPRESSION MICRODISCECTOMY  06/14/2012   Procedure: LUMBAR LAMINECTOMY/DECOMPRESSION MICRODISCECTOMY 1 LEVEL;  Surgeon: Karn Cassis, MD;  Location: [facility];  Service: Neurosurgery;  Laterality: Left;  Left Lumbar Removal of Fragment/Laminectomy Lumbar Three-Four  . LUMBAR WOUND DEBRIDEMENT N/A 02/16/2015   Procedure: LUMBAR WOUND DEBRIDEMENT;  Surgeon: Coletta Memos, MD;  Location: [facility];  Service: Neurosurgery;  Laterality: N/A;  . SP ARTHRO THUMB*L*  2013  left  2000 right   bilateral tendon  release.   . WRIST SURGERY  2013   left at [id number]  . WRIST SURGERY  2000   right       Home Medications    Prior to Admission medications   Medication Sig Start Date End Date Taking? Authorizing Provider  aspirin-acetaminophen-caffeine (EXCEDRIN MIGRAINE) [phone] MG per tablet Take 1 tablet by mouth every 6 (six) hours as needed for migraine.    Yes Historical Provider, MD  Camphor-Menthol-Methyl Sal (TIGER BALM MUSCLE RUB EX) Apply 1 application topically as needed (for pain).   Yes Historical Provider, MD  cyclobenzaprine (FLEXERIL) 10 MG tablet Take 10 mg by mouth every 8 (eight) hours as needed (pain).  01/28/15  Yes Historical Provider, MD  lisinopril (PRINIVIL,ZESTRIL) 10 MG tablet Take 20 mg by mouth every evening.  03/05/16 03/05/17 Yes Historical Provider, MD  oxyCODONE (OXY IR/ROXICODONE) 5 MG immediate release tablet Take 15 mg by mouth every 4 (four) hours as needed for  severe pain.   Yes Historical Provider, MD    Family History Family History  Problem Relation Age of Onset  . Arrhythmia Mother     Social History Social History  Substance Use Topics  . Smoking status: Never Smoker  . Smokeless tobacco: Never Used  . Alcohol use No     Comment: rarely     Allergies   Acetaminophen; Baclofen; Gabapentin; Ibuprofen; Lyrica [pregabalin]; Toradol [ketorolac tromethamine]; and Other   Review of Systems Review of Systems  10 Systems reviewed and are negative for acute change except as noted in the HPI.  Physical Exam Updated Vital Signs BP (!) 173/115 (BP Location: Left Arm)   Pulse 88   Temp 98.1 F (36.7 C) (Oral)   Resp 16   SpO2 96%   Physical Exam  Constitutional: He is oriented to person, place, and time. He appears well-developed and well-nourished.  HENT:  Head: Normocephalic and atraumatic.  Eyes: EOM are normal.  Neck:  Soft collar on  Cardiovascular: Normal rate, regular rhythm, normal heart sounds and intact distal pulses.   Pulmonary/Chest: Effort normal and breath sounds normal. No respiratory distress.  Abdominal: Soft. He exhibits no distension. There  is no tenderness.  Musculoskeletal: Normal range of motion.  Neurological: He is alert and oriented to person, place, and time. He displays normal reflexes. No cranial nerve deficit. He exhibits normal muscle tone. Coordination normal.  Pt has subjective numbness and inability to discriminate between sharp and dull in RUE Grip strength is normal, and upper extremity strength is 4+/5. Normal sensory exam in the lower extremities and 4+/5 strength is the lower ext. Pt has 1+ bicepital reflex bilaterally.  Skin: Skin is warm and dry.  Psychiatric: He has a normal mood and affect. Judgment normal.  Nursing note and vitals reviewed.    ED Treatments / Results  DIAGNOSTIC STUDIES: Oxygen Saturation is 99% on RA, normal by my interpretation.  COORDINATION OF CARE:  5:30  AM Discussed treatment plan which includes reviewing his studies from the past and giving IM or oral pain meds, with pt at bedside and pt agreed to plan.  Labs (all labs ordered are listed, but only abnormal results are displayed) Labs Reviewed - No data to display  EKG  EKG Interpretation None       Radiology No results found.  Procedures Procedures (including critical care time)  Medications Ordered in ED Medications  oxyCODONE-acetaminophen (PERCOCET/ROXICET) 5-325 MG per tablet 1 tablet (1 tablet Oral Given 04/13/16 2149)  HYDROmorphone (DILAUDID) injection 2 mg (2 mg Intramuscular Given 04/14/16 0451)     Initial Impression / Assessment and Plan / ED Course  I have reviewed the triage vital signs and the nursing notes.  Pertinent labs & imaging results that were available during my care of the patient were reviewed by me and considered in my medical decision making (see chart for details).  Clinical Course    @5 :05 am: Pt stormed out of the room. He wasn't happy with IM dilaudid and demanded iv meds. I had informed  RN to tell the patient that instead of 1 mg iv, he is getting 2 mg im dilaudid - it just takes few minutes longer for it to get to peak effect. I had planned to call Nsurgery team at 5:30 am to see if they feel strongly about MRI vs. Seeing patient in their clinic since the symptoms appear radicular and not cord compression. Pt started showing me google articles about im vs. Iv dilaudid, and which time I made it clear to him that he can stay in the ER and get the IM meds and we will call the neurosugeon to determine the next step of he may leave. If he leaves, we will not have completed the workup. Pt decided to leave.  Patient understands that his/her actions will lead to inadequate medical workup, and that he/she is at risk of complications of missed diagnosis, which includes morbidity and mortality. Patient is demonstrating good capacity to make decision. We  didn't offer iv meds, as we didn't think their was any need to place an iv, even if MRI was needed it was going to be w/o contrast.      Final Clinical Impressions(s) / ED Diagnoses   Final diagnoses:  Cervical radiculopathy  Numbness   I personally performed the services described in this documentation, which was scribed in my presence. The recorded information has been reviewed and is accurate.  Pt comes in with cc of neck pain, pain shooting down the RLE and subjective paresthesia. No associated weakness, urinary incontinence, urinary retention, bowel incontinence, lower extremity strength and sensory exam is normal and bicepital reflex is normal and equal. Clinical concerns  high for radicular pain. No concerns for infection or cord compression given the hx and exam findings. We will review his imaging results. Pt refusing oral pain meds, we will order im meds whilst we review his workup.     New Prescriptions Discharge Medication List as of 04/14/2016  4:59 AM       Derwood Kaplan, MD 04/14/16 1610

## 2016-04-14 NOTE — ED Notes (Signed)
Pt. upset / verbally aggressive with nurse after notifying that EDP will not order IV Dilaudid .

## 2016-05-29 ENCOUNTER — Other Ambulatory Visit: Payer: Self-pay | Admitting: Neurosurgery

## 2016-05-29 DIAGNOSIS — M5412 Radiculopathy, cervical region: Secondary | ICD-10-CM

## 2016-06-01 ENCOUNTER — Ambulatory Visit: Payer: Medicaid Other | Admitting: Neurology

## 2016-06-03 ENCOUNTER — Ambulatory Visit: Payer: Medicaid Other | Admitting: Neurology

## 2016-06-09 ENCOUNTER — Ambulatory Visit
Admission: RE | Admit: 2016-06-09 | Discharge: 2016-06-09 | Disposition: A | Discharge status: Home / Self Care | Payer: Medicaid Other | Source: Ambulatory Visit | Attending: Neurosurgery | Admitting: Neurosurgery

## 2016-06-09 VITALS — BP 151/103 | HR 75

## 2016-06-09 DIAGNOSIS — M5030 Other cervical disc degeneration, unspecified cervical region: Secondary | ICD-10-CM

## 2016-06-09 DIAGNOSIS — M5412 Radiculopathy, cervical region: Secondary | ICD-10-CM

## 2016-06-09 DIAGNOSIS — M47812 Spondylosis without myelopathy or radiculopathy, cervical region: Secondary | ICD-10-CM

## 2016-06-09 DIAGNOSIS — M4802 Spinal stenosis, cervical region: Secondary | ICD-10-CM

## 2016-06-09 MED ORDER — OXYCODONE-ACETAMINOPHEN 5-325 MG PO TABS
2.0000 | ORAL_TABLET | Freq: Once | ORAL | Status: AC
  Administered 2016-06-09: 2 via ORAL

## 2016-06-09 MED ORDER — ONDANSETRON HCL 4 MG/2ML IJ SOLN
4.0000 mg | Freq: Once | INTRAMUSCULAR | Status: AC
  Administered 2016-06-09: 4 mg via INTRAMUSCULAR

## 2016-06-09 MED ORDER — IOPAMIDOL (ISOVUE-M 300) INJECTION 61%
10.0000 mL | Freq: Once | INTRAMUSCULAR | Status: AC | PRN
  Administered 2016-06-09: 10 mL via INTRATHECAL

## 2016-06-09 MED ORDER — DIAZEPAM 5 MG PO TABS
10.0000 mg | ORAL_TABLET | Freq: Once | ORAL | Status: AC
  Administered 2016-06-09: 10 mg via ORAL

## 2016-06-09 MED ORDER — ONDANSETRON HCL 4 MG/2ML IJ SOLN: 4.0000 mg | Freq: Four times a day (QID) | INTRAMUSCULAR | Status: DC | PRN

## 2016-06-09 MED ORDER — MEPERIDINE HCL 100 MG/ML IJ SOLN
100.0000 mg | Freq: Once | INTRAMUSCULAR | Status: AC
  Administered 2016-06-09: 100 mg via INTRAMUSCULAR

## 2016-06-09 NOTE — Discharge Instructions (Signed)

## 2016-07-02 IMAGING — US US ABDOMEN COMPLETE W/ ELASTOGRAPHY
1 series · 13 of 25 positions shown · non-contrast
Comparison: None.

CLINICAL DATA: Chronic hepatitis-C with hepatic coma.



[Series 1: us abdomen complete w/ elastography · 0.15mm/px · 13 of 124 slices shown]
[im 1/124]
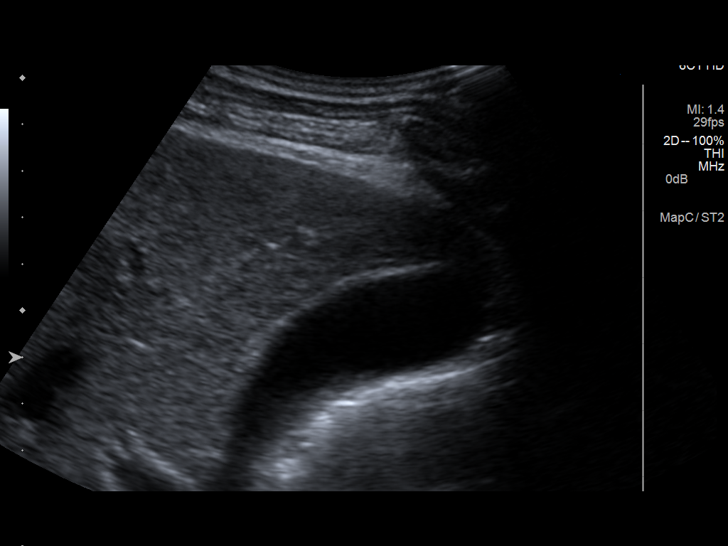
[im 11/124]
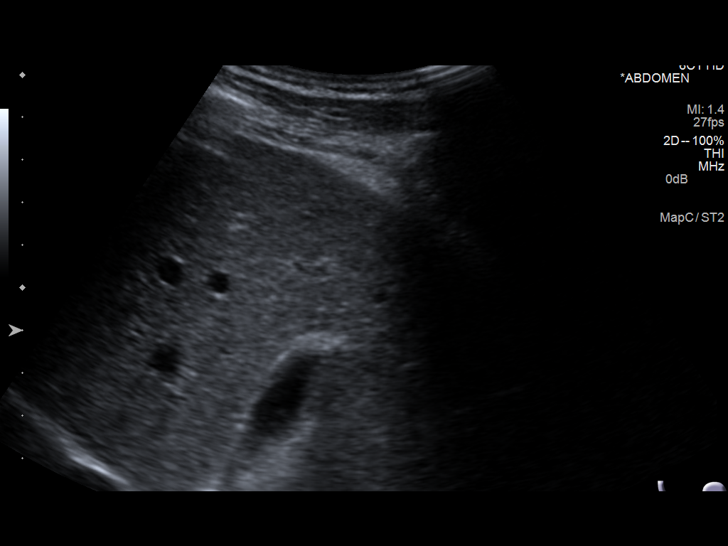
[im 21/124]
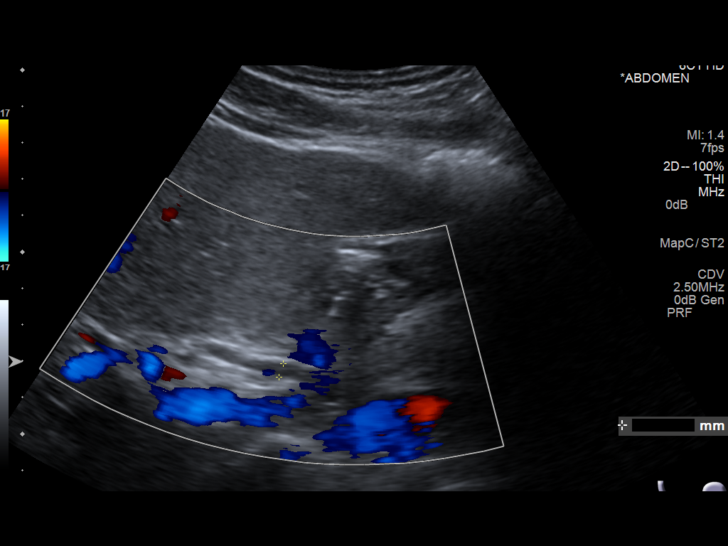
[im 31/124]
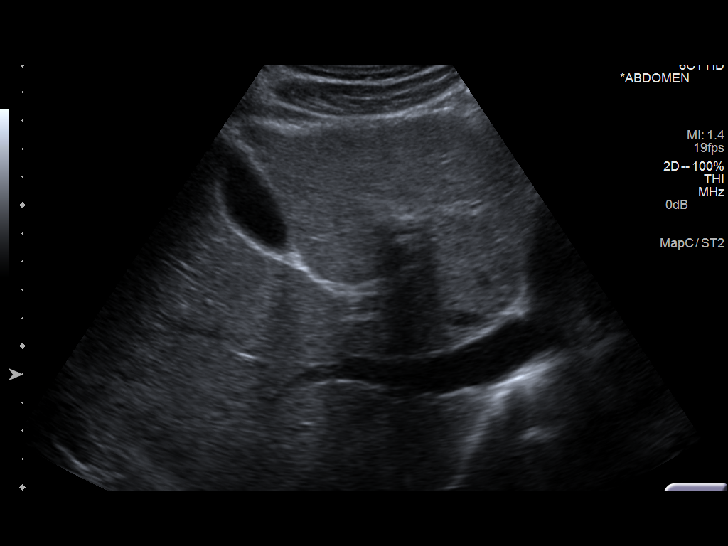
[im 42/124]
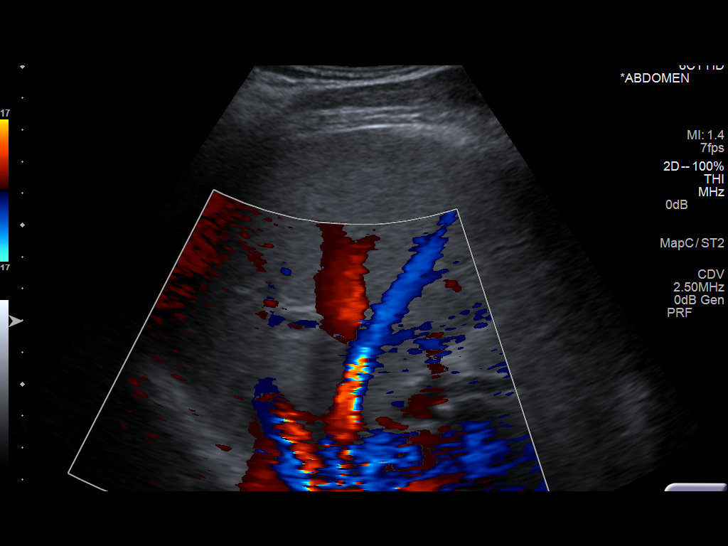
[im 52/124]
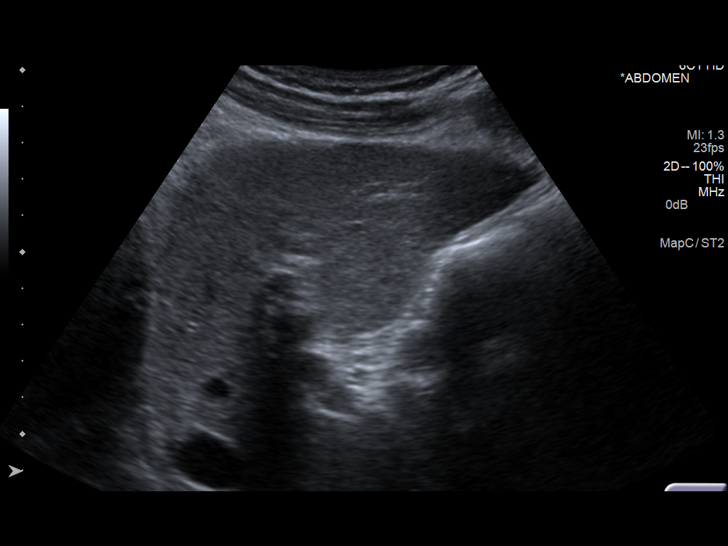
[im 62/124]
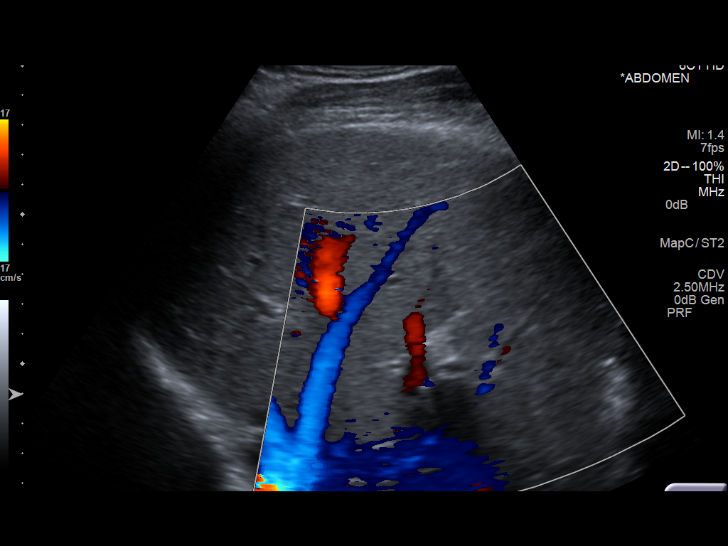
[im 72/124]
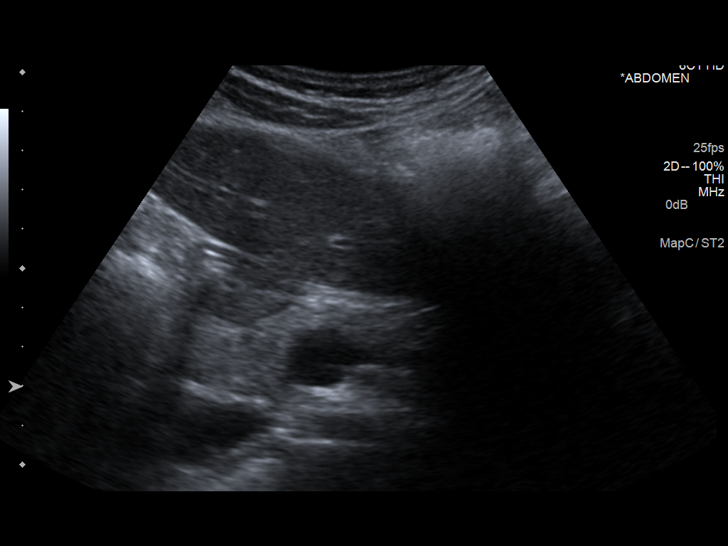
[im 83/124]
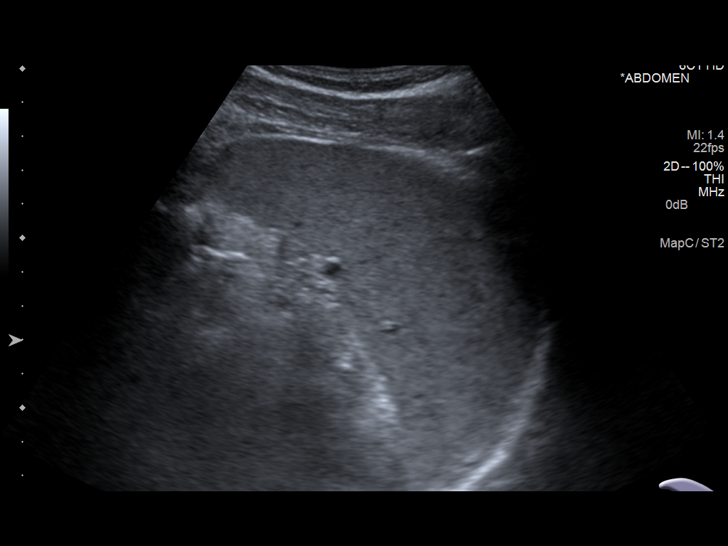
[im 93/124]
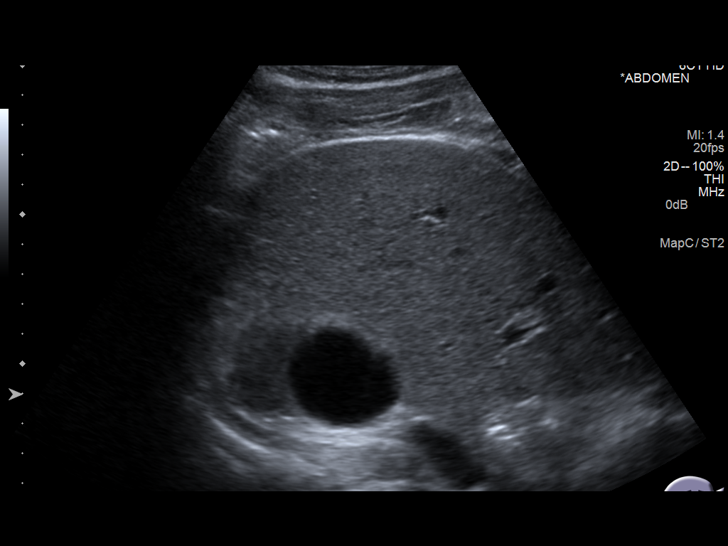
[im 103/124]
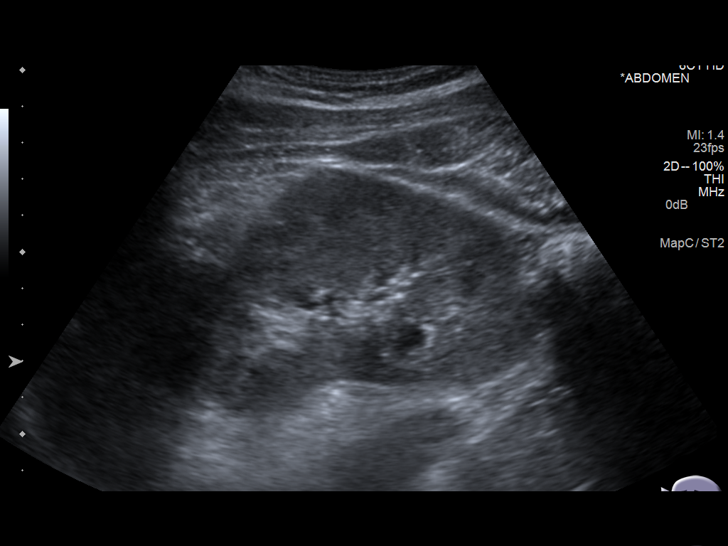
[im 113/124]
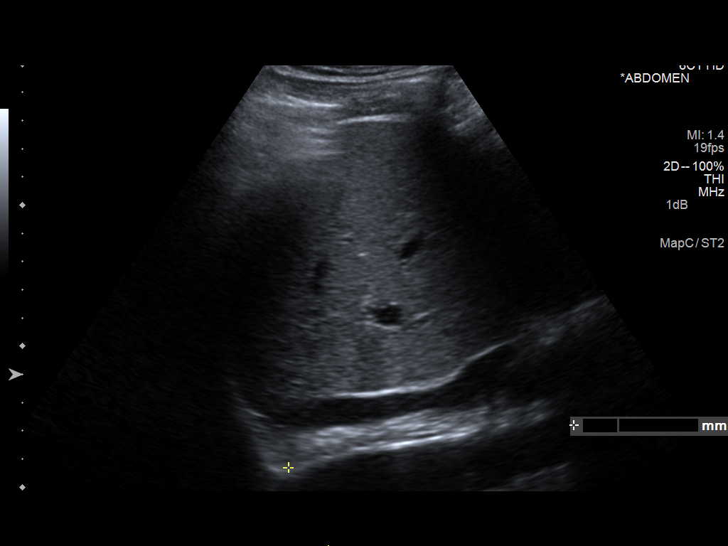
[im 124/124]
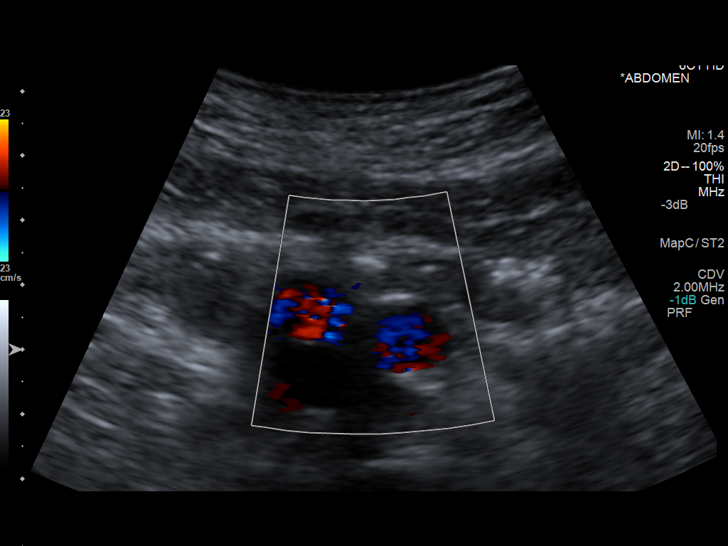

[13 of 25 positions shown; findings below may reference images not displayed]

FINDINGS: ULTRASOUND ABDOMEN

Gallbladder: No gallstones or wall thickening visualized. No
sonographic Murphy sign noted. Tiny approximately 3 mm nonmobile
polyp noted.

Common bile duct: Diameter: 4 mm, within normal limits

Liver: No focal lesion identified. Within normal limits in
parenchymal echogenicity.

IVC: No abnormality visualized.

Pancreas: Visualized portion unremarkable.

Spleen: Size and appearance within normal limits.

Right Kidney: Length: 11.3 cm. Echogenicity within normal limits.
Simple appearing cyst noted in the upper pole measuring 4.5 cm. No
mass or hydronephrosis visualized.

Left Kidney: Length: 12.5 cm. Echogenicity within normal limits. No
mass or hydronephrosis visualized.

Abdominal aorta: No aneurysm visualized.

Other findings: None.

ULTRASOUND HEPATIC ELASTOGRAPHY

Device: Siemens Helix VTQ

Transducer 6C 1

Patient position: Supine

Number of measurements:  10

Hepatic Segment:  8

Median velocity:   2.70  m/sec

IQR:

IQR/Median velocity ratio

Corresponding Metavir fibrosis score:  Some F3 +F4

Risk of fibrosis: High

Limitations of exam: None

Pertinent findings noted on other imaging exams:  None

Please note that abnormal shear wave velocities may also be
identified in clinical settings other than with hepatic fibrosis,
such as: acute hepatitis, elevated right heart and central venous
pressures including use of beta blockers, Niemeyer disease
(Bera), infiltrative processes such as
mastocytosis/amyloidosis/infiltrative tumor, extrahepatic
cholestasis, in the post-prandial state, and liver transplantation.
Correlation with patient history, laboratory data, and clinical
condition recommended.
IMPRESSION: No evidence of gallstones or biliary dilatation.

Normal sonographic appearance of liver.  No liver mass identified.

Benign-appearing right renal cyst.

Median hepatic shear wave velocity is calculated at 2.70 m/sec.

Corresponding Metavir fibrosis score is some F3 +F4.

Risk of fibrosis is high.

Follow-up:  Follow-up advised

## 2017-03-23 ENCOUNTER — Encounter: Payer: Self-pay | Admitting: Neurology

## 2017-03-23 ENCOUNTER — Ambulatory Visit (INDEPENDENT_AMBULATORY_CARE_PROVIDER_SITE_OTHER): Payer: Medicaid Other | Admitting: Neurology

## 2017-03-23 VITALS — BP 142/94 | HR 86 | Ht 70.0 in | Wt 185.0 lb

## 2017-03-23 DIAGNOSIS — R419 Unspecified symptoms and signs involving cognitive functions and awareness: Secondary | ICD-10-CM

## 2017-03-23 NOTE — Progress Notes (Signed)
Subjective:    Patient ID: Corey Gilbert is a 51 y.o. male.  HPI     Interim history:   Dear Dr. Raechel Chute,   I saw your patient, Corey Gilbert, upon your kind request in my neurologic clinic today for initial consultation of his memory loss. The patient is accompanied by his young son today. I have previously seen him on 2 occasions for migraine history, last seen in June 2017, at which time he was reluctant to start a new migraine prevention treatment and reported that Maxalt was not helpful. He had previously seen Dr. Metta Clines. He reported having tried verapamil and propranolol as well as other preventative medications in the past. He had gone to a TBI clinic as well. I suggested we proceed with a brain MRI with and without contrast which he had on 03/13/2016 and I reviewed the results: IMPRESSION:  This MRI of the brain with and without contrast shows the following.    1.   A couple small punctate T2/FLAIR hyperintense foci in the subcortical white matter.    This is nonspecific and could be normal for age. This could also be due to minimal chronic microvascular ischemic change or changes related to migraine.  2.   There is no definite interval change when compared to the MRI dated 06/26/2014. We called him with his test results. I reviewed your office note from 01/29/2017, which you kindly included. He reports memory loss for years and he feels, this is progressive. He feels, he has more forgetfulness and misplaces things. Driving is okay, but uses GPS for new routes and longer distances.  Does not sleep very well. Still having issues with neck pain and recurrent HAs. Dr. Joya Salm has retired. He states, his office will not give him an appointment with one of Dr. Harley Hallmark colleagues. He is scheduled for a neck MRI as I understand. He reports seeing a headache specialist and also a TBI specialist in the past but cannot remember the dates were names. He does not take any narcotic pain medication.  He is a nonsmoker. He tries to drink enough water. He drinks alcohol about once or twice per month on average, typically in the form of beer. No family history of dementia as an Alzheimer's disease or vascular dementia as he recalls.  Previously (copied from previous notes for reference):   03/03/16: Corey Gilbert is a 51 year old right-handed gentleman with an underlying medical history of arthritis, degenerative spine disease, status post ACDF, status post multiple neck surgeries, status post concussion on several occasions (per patient's verbal report, d/t to playing football in high school and other contact sports, doing martial arts), depression, and anxiety, with a diagnosis of bipolar of many years (not on meds any longer d/t SEs, sees counselor prn), kidney stones, history of hepatitis, and mildly overweight state, who presents for a sooner than scheduled appointment after his recent hospitalization. The patient is accompanied by his wife and son today. I first met him recently on 02/27/2016 at the request of Dr. Joya Salm, at which time the patient reported a history of recurrent headaches for about 3 years. I suggested we review prior office notes from Dr. Metta Clines and MRI test results from before as well. I suggested when necessary use of Maxalt. In the interim, patient went to the emergency room for worsening headaches.  He reports feeling better.He does not think that Maxalt has been helpful, even after taking it 2 times within 2 hours. His blood pressure is elevated. He is not  on any blood pressure medication. He has not seen his primary care physician in over one year. I have not received any records from Dr. Applegate's office. We will call his office again today. I reviewed her discharge paperwork he was given a [facility] emergency department. He was treated with Reglan, Benadryl and Dilaudid and improved. He was seen by Ms. Lanell Persons, PA. He was advised to use Maxalt as needed and oxycodone as  needed. He had a several day Hx of migraine and neck pain prior to going to the ER. He sees Dr. Joya Salm or his PA every 2 weeks for a refill on his hydrocodone. He averages about 4 pills a day.   02/27/2016: He reports a history of recurrent headaches for the past 3 years or so. Headaches are pressure like as well as throbbing and usually start in the back of the head and the posterior neck area and travels forwards affecting both temples, typically left side a little worse than right. Associated symptoms or photophobia, sonophobia, nausea, rare vomiting, and blurry vision. Sometimes or often rather he reports having an aura of decrease in visual field and tunnel vision at times with some scintillating scotoma reported or described. He has no overt family history of migraines and no remote history of migraines growing up. He has a prior diagnosis of bipolar disorder and was tried on several medications in the past including Depakote which caused an exacerbation in his hallucinations, he could not tolerate Lyrica or gabapentin, he was apparently also tried on Topamax but is not sure. He could not tolerate any of the SSRI type medications as I understand but is not able to name me the exact ones that he tried. He saw some 3 years ago Dr. Metta Clines in [place] and may have had a brain MRI at the time. I do not have any records available for review from his office. The patient is advised to try to get records from Dr. Applegate's office. He may have tried amitriptyline at the time and some blood pressure medication, perhaps verapamil . He does not tend to snore and does not have any apneas per wife. It helps to lie down in a darkened room. Sometimes he takes Flexeril at night but it makes him quite sleepy. He is on oxycodone 10 mg strength as needed and tends to take about one pill every other day at this time. He is a nonsmoker, rarely smokes marijuana, and drinks about 1-2 cups of coffee per day, is trying to reduce  his soda intake. He takes Excedrin as needed, averaging about once a week. Altogether, his headache frequency is erratic. He can go a week without a headache, altogether, he believes he gets about 10 days of headaches per average month at this time. He lives with his wife and 76 year old son. He does not work. He has prescription eyeglasses. He does not report any neurological accompaniment while having a headache or headache aura such as one-sided weakness, numbness, slurring of speech or droopy face.  He does report a history of memory issues and not being able to concentrate very well, particularly when he has a headache. I reviewed your office notes from, which you kindly included. He had a CT cervical spine with contrast on 12/19/2015 and and a myelogram, and I reviewed the report: IMPRESSION: 1. Incomplete purchase of the superior screw associated with the half plate at H6-0. The screw is not covered with bone along its inferior margin at the inferior endplate of C6. 2.  Slight lucency about the screws bilaterally at C7 suggesting some loosening. 3. Slight anterolisthesis at C6-7 non plan from radiographs with the patient prone and on the upright images. This is slightly worse with flexion. The anterolisthesis is reduced on the CT scan with the patient in the supine position. 4. Soft tissue narrowing of the left central canal and foramen at C6-7 may reflect disc material or granulation tissue. 5. The central canal is decompressed at C4-5 and C5-6 following previous surgery. 6. Mild foraminal narrowing bilaterally at C4-5 is stable. 7. Moderate left and mild right foraminal stenosis at C3-4 is stable. 8. Mild left foraminal narrowing at C2-3 is stable.  His Past Medical History Is Significant For: Past Medical History:  Diagnosis Date  . Anxiety   . Arthritis   . Bipolar 1 disorder ([place])   . Bipolar disorder ([place])   . Brain injuries ([place])   . Complication of anesthesia   .  Concussion   . Depression   . Family history of anesthesia complication    pon& v  . Headache   . Hepatitis    Hepatitis C - has been treated with Harvoni  . Kidney stones   . Near syncope    due to opain, last time  early Sept 2013  . PONV (postoperative nausea and vomiting)    not on most recent surgery    His Past Surgical History Is Significant For: Past Surgical History:  Procedure Laterality Date  . ANTERIOR CERVICAL DECOMP/DISCECTOMY FUSION  09/27/2012   Procedure: ANTERIOR CERVICAL DECOMPRESSION/DISCECTOMY FUSION 2 LEVELS;  Surgeon: Floyce Stakes, MD;  Location: [facility];  Service: Neurosurgery;  Laterality: N/A;  Cervical four-five, Cervical five-six,  Anterior cervical decompression/diskectomy/fusion/plate  . ANTERIOR CERVICAL DECOMP/DISCECTOMY FUSION N/A 11/13/2015   Procedure: C6-7 Anterior cervical decompression/diskectomy/fusion;  Surgeon: Leeroy Cha, MD;  Location: [place] [facility] ORS;  Service: Neurosurgery;  Laterality: N/A;  C6-7 Anterior cervical decompression/diskectomy/fusion  . ANTERIOR CERVICAL DECOMP/DISCECTOMY FUSION N/A 11/29/2015   Procedure: Removal of anterior cervical plate and Insertion of Half-Plate Caudal;  Surgeon: Leeroy Cha, MD;  Location: [place] NEURO ORS;  Service: Neurosurgery;  Laterality: N/A;  Removal of anterior cervical plate and Insertion of Half-Plate Caudal  . BACK SURGERY  2013   at [facility]  . LITHOTRIPSY    . LUMBAR LAMINECTOMY/DECOMPRESSION MICRODISCECTOMY  06/14/2012   Procedure: LUMBAR LAMINECTOMY/DECOMPRESSION MICRODISCECTOMY 1 LEVEL;  Surgeon: Floyce Stakes, MD;  Location: [place] NEURO ORS;  Service: Neurosurgery;  Laterality: Left;  Left Lumbar Removal of Fragment/Laminectomy Lumbar Three-Four  . LUMBAR WOUND DEBRIDEMENT N/A 02/16/2015   Procedure: LUMBAR WOUND DEBRIDEMENT;  Surgeon: Ashok Pall, MD;  Location: [place] NEURO ORS;  Service: Neurosurgery;  Laterality: N/A;  . SP ARTHRO THUMB*L*  2013  left  2000 right   bilateral tendon   release.   . WRIST SURGERY  2013   left at [facility]  . WRIST SURGERY  2000   right    His Family History Is Significant For: Family History  Problem Relation Age of Onset  . Arrhythmia Mother     His Social History Is Significant For: Social History   Social History  . Marital status: Married    Spouse name: N/A  . Number of children: 1  . Years of education: college   Occupational History  . Unemployed [place]    Social History Main Topics  . Smoking status: Never Smoker  . Smokeless tobacco: Never Used  . Alcohol use No  Comment: rarely  . Drug use: Yes    Types: Marijuana, Oxycodone     Comment: uses marijuana to help bipolar  . Sexual activity: Yes   Other Topics Concern  . None   Social History Narrative   Lives in wife and son in [place].    Drinks about 1 cup of coffee a day     His Allergies Are:  Allergies  Allergen Reactions  . Lyrica [Pregabalin] Other (See Comments)    Mania  . Neurontin [Gabapentin] Other (See Comments)    Goes manic  . Toradol [Ketorolac Tromethamine] Other (See Comments)    Reacts with other medications; also, fever and mania  . Tylenol [Acetaminophen] Other (See Comments)    Avoids due to history of Hepatitis C  :   His Current Medications Are:  Outpatient Encounter Prescriptions as of 03/23/2017  Medication Sig  . aspirin-acetaminophen-caffeine (EXCEDRIN MIGRAINE) 250-250-65 MG per tablet Take 1 tablet by mouth every 6 (six) hours as needed for migraine.   . Camphor-Menthol-Methyl Sal (TIGER BALM MUSCLE RUB EX) Apply 1 application topically as needed (for pain).  . cyclobenzaprine (FLEXERIL) 10 MG tablet Take 10 mg by mouth every 8 (eight) hours as needed (pain).   Marland Kitchen lisinopril (PRINIVIL,ZESTRIL) 10 MG tablet Take 20 mg by mouth every evening.   . [DISCONTINUED] oxyCODONE (OXY IR/ROXICODONE) 5 MG immediate release tablet Take 15 mg by mouth every 4 (four) hours as needed for severe pain.    Facility-Administered Encounter Medications as of 03/23/2017  Medication  . mupirocin ointment (BACTROBAN) 2 % 1 application  :  Review of Systems:  Out of a complete 14 point review of systems, all are reviewed and negative with the exception of these symptoms as listed below:  Review of Systems  Neurological:       Pt presents today to discuss his migraine headaches and memory loss. Pt says that he is getting 1-2 migraines per month. Pt will go to a cold and dark room when he has a migraine and it that does not resolve it, he proceeds to the ER. Pt is also complaining of memory loss.   Objective:  Neurological Exam  Physical Exam Physical Examination:   Vitals:   03/23/17 0828  BP: (!) 142/94  Pulse: 86   General Examination: The patient is a very pleasant 51 y.o. male in no acute distress. He appears well-developed and well-nourished and well groomed.   HEENT: Normocephalic, atraumatic, pupils are equal, round and reactive to light and accommodation. Extraocular tracking is good without limitation to gaze excursion or nystagmus noted. Normal smooth pursuit is noted. Hearing is grossly intact. Face is symmetric with normal facial animation and normal facial sensation. Speech is clear with no dysarthria noted. There is no hypophonia. There is no lip, neck/head, jaw or voice tremor. Neck has good ROM. There are no carotid bruits on auscultation. Oropharynx exam reveals: mild mouth dryness, adequate dental hygiene. Mallampati is class I. Tongue protrudes centrally and palate elevates symmetrically. Tonsils are average in sized, about 1+ to perhaps 2+.   Chest: Clear to auscultation without wheezing, rhonchi or crackles noted.  Heart: S1+S2+0, regular and normal without murmurs, rubs or gallops noted.   Abdomen: Soft, non-tender and non-distended with normal bowel sounds appreciated on auscultation.  Extremities: There is no pitting edema in the distal lower extremities bilaterally.  Pedal pulses are intact.  Skin: Warm and dry without trophic changes noted. There are no varicose veins.  Musculoskeletal: exam reveals no obvious  joint deformities, tenderness or joint swelling or erythema.   Neurologically:  Mental status: The patient is awake, alert and oriented in all 4 spheres. His immediate and remote memory, attention, language skills and fund of knowledge are appropriate. There is no evidence of aphasia, agnosia, apraxia or anomia. Speech is clear with normal prosody and enunciation. Thought process is linear. Mood is constricted and affect is blunted.   On 03/23/2017: MMSE: 24+/30, of note, patient stated, he could not go on with the memory scores, due to underlying headache. No photophobia today. NAD, but did not exert full effort on memory testing. CDT: 4/4, AFT: 16/min.  Cranial nerves II - XII are as described above under HEENT exam. In addition: shoulder shrug is normal with equal shoulder height noted. Motor exam: Normal bulk, strength and tone is noted. There is no drift, tremor or rebound. Romberg is negative. Reflexes are 2+ throughout. Babinski: Toes are flexor bilaterally. Fine motor skills and coordination: intact with normal finger taps, normal hand movements, normal rapid alternating patting, normal foot taps and normal foot agility.  Cerebellar testing: No dysmetria or intention tremor on finger to nose testing. There is no truncal or gait ataxia.  Sensory exam: intact to light touch, vibration, temperature sense in the upper and lower extremities, reports decrease vibration around R knee.  Gait, station and balance: He stands easily. No veering to one side is noted. No leaning to one side is noted. Posture is age-appropriate and stance is narrow based. Gait shows normal stride length and normal pace. No problems turning are noted. Tandem walk is unremarkable, without flip flops on.            Assessment and Plan:   In summary, Corey Gilbert is a very pleasant  51 y.o.-year old male with an underlying medical history of arthritis, degenerative spine disease, status post ACDF, status post multiple neck surgeries, reported Hx of concussion on several occasions (per patient's verbal report, d/t to playing football in high school and other contact sports, doing martial arts), depression, and anxiety, with a diagnosis of bipolar of many years (not on meds any longer d/t SEs, sees counselor prn), kidney stones, history of hepatitis, and mildly overweight state, who presents For evaluation of memory loss. He could not participate fully in the memory testing with MMSE today. He reports having a headache which interferes with his concentration. In the past, he has seen a headache specialist, a TBI specialist as well as I understand. I saw him last year but he was reluctant to try any new preventative medications and he is encouraged to talk to you about seeing a headache specialist such as the headache wellness Center locally or a headache specialist at one of the academic centers in the area. He is agreeable to discussing this with you. Previously, he had seen Dr. Metta Clines. He has also been seeing Dr. Joya Salm but patient reports that since Dr. Harley Hallmark retirement he is not able to get an appointment with one of his associates. From the memory standpoint, we will investigate things further with additional blood work today including RPR, TSH, B12 and we will call him with his test results. Furthermore, I suggested neuropsychological evaluation with a license neuropsychologist for cognitive testing but advised him strongly that full neuropsychological battery of testing is lengthy and requires full participation and full effort. I will see him back on an as-needed basis at this time. His exam is nonfocal. Last years brain MRI was benign. He is reassured in that  regard. Thank you very much for allowing me to participate in the care of this nice patient. If I can be of any further  assistance to you please do not hesitate to call me at [phone].  Sincerely,   Star Age, MD, PhD

## 2017-03-23 NOTE — Patient Instructions (Addendum)
You have complaints of memory loss: memory loss or changes in cognitive function can have many reasons and does not always mean you have dementia. Conditions that can contribute to subjective or objective memory loss include: depression, stress, poor sleep from insomnia or sleep apnea, dehydration, fluctuation in blood sugar values, thyroid or electrolyte dysfunction and certain vitamin deficiencies. Dementia can be caused by stroke, brain atherosclerosis or brain vascular disease due to vascular risk factors (smoking, high blood pressure, high cholesterol, obesity and uncontrolled diabetes), certain degenerative brain disorders (including Parkinson's disease and Multiple sclerosis) and by Alzheimer's disease or other, more rare and sometimes hereditary causes. We will do some additional testing: blood work (which we will do today).   You could not participate fully in memory testing today, so we don't actually have a reliable score.  For your headaches, as discussed last year, please talk to your PCP about referral to a [facility], or [facility] or [facility].   We will request a formal cognitive test called neuropsychological evaluation which is done by a licensed neuropsychologist. We will make a referral in that regard. We will call you with your results. Your brain scan from last year was benign, which is course is reassuring.

## 2017-03-24 LAB — B12 AND FOLATE PANEL
FOLATE: 15.8 ng/mL (ref >3.0)
VITAMIN B 12: 437 pg/mL (ref 232–1245)

## 2017-03-24 LAB — VITAMIN D 25 HYDROXY (VIT D DEFICIENCY, FRACTURES): VIT D 25 HYDROXY: 24.8 ng/mL — AB (ref 30.0–100.0)

## 2017-03-24 LAB — HGB A1C W/O EAG: Hgb A1c MFr Bld: 5.4 % (ref 4.8–5.6)

## 2017-03-24 LAB — TSH: TSH: 1.45 u[IU]/mL (ref 0.450–4.500)

## 2017-03-24 LAB — RPR: RPR Ser Ql: NONREACTIVE

## 2017-03-25 ENCOUNTER — Telehealth: Payer: Self-pay

## 2017-03-25 ENCOUNTER — Encounter: Payer: Self-pay | Admitting: Psychology

## 2017-03-25 NOTE — Telephone Encounter (Signed)
Patient called my direct line. Left his name and phone number. No other information. Called patient to get more information about his call to the clinic. Left message for him to call me back.

## 2017-03-25 NOTE — Telephone Encounter (Signed)
-----   Message from Huston FoleySaima Athar, MD sent at 03/25/2017  7:59 AM EDT ----- Labs normal except mildly low vit D at 24.8 (normal range is around 30-100). I would recommend that patient start an OTC Vitamin D supplement: 1000-2000 units daily of any vitamin D supplement of His choice should be fine. I would recommend recheck of vitamin D status in 3-6 months with PCP.  Huston FoleySaima Athar, MD, PhD [facility])

## 2017-03-25 NOTE — Telephone Encounter (Signed)
I called pt to discuss his results. No answer, left a message asking him to call me back. 

## 2017-03-25 NOTE — Telephone Encounter (Signed)
Patient's wife called back regarding patient. He was looking to get an appointment in the concussion clinic. I asked if patient has had a concussion recently. 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 I told patient that Dr. Katrinka BlazingSmith would refer him to neurology. Wife said that they have an appointment with Neurology in a couple of months. Told her to call back with any questions.

## 2017-03-25 NOTE — Progress Notes (Signed)
Labs normal except mildly low vit D at 24.8 (normal range is around 30-100). I would recommend that patient start an OTC Vitamin D supplement: 1000-2000 units daily of any vitamin D supplement of His choice should be fine. I would recommend recheck of vitamin D status in 3-6 months with PCP.  Huston FoleySaima Caleel Kiner, MD, PhD [facility]

## 2017-03-29 NOTE — Telephone Encounter (Signed)
I called pt again to discuss lab results. No answer, left a message asking him to call me back.

## 2017-04-06 NOTE — Telephone Encounter (Signed)
I called pt again to discuss his lab results. No answer, left a message asking him to call me back. This is my third attempt to call pt to discuss results. Will send the pt a letter asking him to call me back.

## 2017-04-26 NOTE — Telephone Encounter (Signed)
Pt's wife, Corey Gilbert, per DPR, returned my calls. I advised her that pt's labs were normal except for mildly low vitamin D and that Dr. Frances FurbishAthar recommends starting a vitamin D supplement of his choice, OTC, 1000-2000 units daily and then recheck vitamin D level with PCP in 3-6 months. Pt's wife verbalized understanding of results. Pt 's wife had no questions at this time but was encouraged to call back if questions arise.

## 2017-06-15 IMAGING — NM NM MISC PROCEDURE
6 series · 36 of 36 positions shown · non-contrast
Comparison: none

[Series 1: stress-gsp · 6.40mm/px · 6 of 512 frames shown]
[frame 43/512]
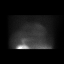
[frame 128/512]
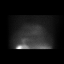
[frame 214/512]
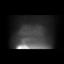
[frame 299/512]
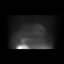
[frame 384/512]
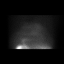
[frame 470/512]
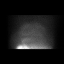

[Series 1: rest · 6.40mm/px · 6 of 64 frames shown]
[frame 6/64]
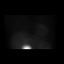
[frame 16/64]
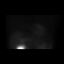
[frame 27/64]
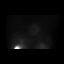
[frame 38/64]
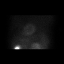
[frame 48/64]
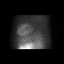
[frame 59/64]
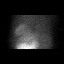

[Series 1: wbr_s-proj_st stress-gsp · 6.40mm/px · 6 of 512 frames shown]
[frame 43/512]
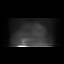
[frame 128/512]
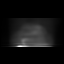
[frame 214/512]
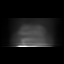
[frame 299/512]
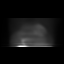
[frame 384/512]
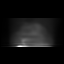
[frame 470/512]
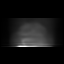

[Series 1: stress-sum-em · 6.40mm/px · 6 of 64 frames shown]
[frame 6/64]
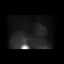
[frame 16/64]
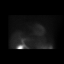
[frame 27/64]
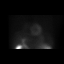
[frame 38/64]
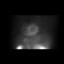
[frame 48/64]
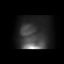
[frame 59/64]
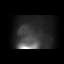

[Series 1: wbr_r-proj_st rest · 6.40mm/px · 6 of 64 frames shown]
[frame 6/64]
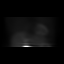
[frame 16/64]
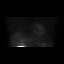
[frame 27/64]
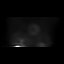
[frame 38/64]
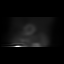
[frame 48/64]
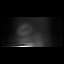
[frame 59/64]
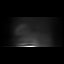

[Series 1: wbr_s-proj_st stress-sum-em · 6.40mm/px · 6 of 64 frames shown]
[frame 6/64]
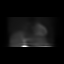
[frame 16/64]
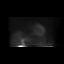
[frame 27/64]
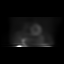
[frame 38/64]
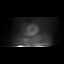
[frame 48/64]
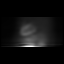
[frame 59/64]
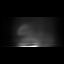

[36 of 36 positions shown; findings below may reference images not displayed]

Canned report from images found in remote index.

Refer to host system for actual result text.

## 2017-06-29 ENCOUNTER — Encounter: Payer: Medicaid Other | Admitting: Psychology

## 2017-07-13 ENCOUNTER — Encounter: Payer: Medicaid Other | Admitting: Psychology

## 2017-11-09 ENCOUNTER — Ambulatory Visit: Payer: Self-pay | Admitting: Neurology

## 2017-11-09 ENCOUNTER — Telehealth: Payer: Self-pay

## 2017-11-09 NOTE — Telephone Encounter (Signed)
Pt did not show for their appt with Dr. Athar today.  

## 2017-11-10 ENCOUNTER — Encounter: Payer: Self-pay | Admitting: Neurology

## 2017-11-15 ENCOUNTER — Telehealth: Payer: Self-pay | Admitting: Neurology

## 2017-11-15 NOTE — Telephone Encounter (Signed)
I would encourage patient to talk to his PCP about referral. I have no specific provider in mind. Would recommend, patient speak with PCP to discuss referral to a headache management center, e. g [facility], [facility], [facility], etc.

## 2017-11-15 NOTE — Telephone Encounter (Signed)
Pts wife called stating pt is requesting to switch providers. He doesn't have anyone specific in mind to leave that up to Dr. Frances FurbishAthar. Pts wife would like the new RN give them a call to set up the appt once it was figured out

## 2017-11-15 NOTE — Telephone Encounter (Signed)
I called pt's wife, Corey Gilbert, per [place]. I advised her of Dr. Teofilo PodAthar's recommendations. Pt's wife is agreeable to this plan and had no further questions at this time.

## 2018-10-17 ENCOUNTER — Encounter: Payer: Self-pay | Admitting: Surgery

## 2018-10-17 ENCOUNTER — Ambulatory Visit: Payer: Self-pay | Admitting: Surgery

## 2018-11-14 ENCOUNTER — Encounter: Payer: Self-pay | Admitting: Surgery

## 2018-11-14 ENCOUNTER — Other Ambulatory Visit: Payer: Self-pay | Admitting: *Deleted

## 2018-11-14 ENCOUNTER — Ambulatory Visit (INDEPENDENT_AMBULATORY_CARE_PROVIDER_SITE_OTHER): Payer: Medicaid Other | Admitting: Surgery

## 2018-11-14 ENCOUNTER — Encounter: Payer: Self-pay | Admitting: *Deleted

## 2018-11-14 VITALS — BP 168/115 | HR 77 | Temp 98.5°F | Resp 16 | Ht 70.0 in | Wt 200.1 lb

## 2018-11-14 DIAGNOSIS — I728 Aneurysm of other specified arteries: Secondary | ICD-10-CM | POA: Diagnosis not present

## 2018-11-14 NOTE — Progress Notes (Signed)
 [facility] [place]  Patient name: Corey Gilbert MRN: [id number] DOB: 08/17/1966 Sex: male   REQUESTING PROVIDER:    Hanna Jane Dodson   REASON FOR CONSULT:    Right temporal pain  HISTORY OF PRESENT ILLNESS:   Corey Gilbert is a 53 y.o. male, who is referred today for evaluation of right temporal pain.  Apparently back in July 2019 there was concern over possible giant cell arteritis because the patient was having pain in his right temporal area as well as some vision changes.  According to the patient his testing came back negative.  He has had persistent pain in this area since that time.  By his report he has had CAT scans and MRIs which have been unremarkable.  He states that migraine medication does not help his problems.  He only gets relief from muscle relaxers was just make him go to sleep.  Patient grew up as a boxer.  He has had multiple concussions.  He has mild cognitive impairment with memory loss.  He has a history of hepatitis C, treated with Harvoni.  He is also been diagnosed with bipolar disorder.  He is medically managed for hypertension.  He does have a history of bundle branch block.  PAST MEDICAL HISTORY    Past Medical History:  Diagnosis Date  . Anxiety   . Arthritis   . Bipolar 1 disorder (HCC)   . Bipolar disorder (HCC)   . Brain injuries (HCC)   . Complication of anesthesia   . Concussion   . Depression   . Family history of anesthesia complication    pon& v  . Headache   . Hepatitis    Hepatitis C - has been treated with Harvoni  . Kidney stones   . Near syncope    due to opain, last time  early Sept 2013  . PONV (postoperative nausea and vomiting)    not on most recent surgery     FAMILY HISTORY   Family History  Problem Relation Age of Onset  . Arrhythmia Mother     SOCIAL HISTORY:   Social History   Socioeconomic History  . Marital status: Married    Spouse name: Not on  file  . Number of children: 1  . Years of education: college  . Highest education level: Not on file  Occupational History  . 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 Financial resource strain: Not on file  . Food insecurity:    Worry: Not on file    Inability: Not on file  . Transportation needs:    Medical: Not on file    Non-medical: Not on file  Tobacco Use  . Smoking status: Never Smoker  . Smokeless tobacco: Never Used  Substance and Sexual Activity  . Alcohol use: No    Alcohol/week: 0.0 standard drinks    Comment: rarely  . Drug use: Yes    Types: Marijuana, Oxycodone    Comment: uses marijuana to help bipolar  . Sexual activity: Yes  Lifestyle  . Physical activity:    Days per week: Not on file    Minutes per session: Not on file  . Stress: Not on file  Relationships  . Social connections:    Talks on phone: Not on file    Gets together: Not on file    Attends religious service: Not on file    Active member of club or organization: Not on file      Attends meetings of clubs or organizations: Not on file    Relationship status: Not on file  . Intimate partner violence:    Fear of current or ex partner: Not on file    Emotionally abused: Not on file    Physically abused: Not on file    Forced sexual activity: Not on file  Other Topics Concern  . Not on file  Social History Narrative   Lives in wife and son in [place].    Drinks about 1 cup of coffee a day     ALLERGIES:    Allergies  Allergen Reactions  . Lyrica [Pregabalin] Other (See Comments)    Mania  . Neurontin [Gabapentin] Other (See Comments)    Goes manic  . Toradol [Ketorolac Tromethamine] Other (See Comments)    Reacts with other medications; also, fever and mania  . Tylenol [Acetaminophen] Other (See Comments)    Avoids due to history of Hepatitis C    CURRENT MEDICATIONS:    Current Outpatient Medications  Medication Sig Dispense Refill  .  aspirin-acetaminophen-caffeine (EXCEDRIN MIGRAINE) 250-250-65 MG per tablet Take 1 tablet by mouth every 6 (six) hours as needed for migraine.     . Camphor-Menthol-Methyl Sal (TIGER BALM MUSCLE RUB EX) Apply 1 application topically as needed (for pain).    . cyclobenzaprine (FLEXERIL) 10 MG tablet Take 10 mg by mouth every 8 (eight) hours as needed (pain).   0  . lisinopril-hydrochlorothiazide (PRINZIDE,ZESTORETIC) 20-25 MG tablet Take 1 tablet by mouth daily.    . SUMAtriptan (IMITREX) 50 MG tablet Take one tablet at onset of headache, may repeat once after 2 hours if headache recurs.     No current facility-administered medications for this visit.     REVIEW OF SYSTEMS:   [X]  denotes positive finding, [ ]  denotes negative finding Cardiac  Comments:  Chest pain or chest pressure:    Shortness of breath upon exertion:    Short of breath when lying flat:    Irregular heart rhythm:        Vascular    Pain in calf, thigh, or hip brought on by ambulation:    Pain in feet at night that wakes you up from your sleep:     Blood clot in your veins:    Leg swelling:         Pulmonary    Oxygen at home:    Productive cough:     Wheezing:         Neurologic    Sudden weakness in arms or legs:     Sudden numbness in arms or legs:     Sudden onset of difficulty speaking or slurred speech:    Temporary loss of vision in one eye:     Problems with dizziness:         Gastrointestinal    Blood in stool:      Vomited blood:         Genitourinary    Burning when urinating:     Blood in urine:        Psychiatric    Major depression:         Hematologic    Bleeding problems:    Problems with blood clotting too easily:        Skin    Rashes or ulcers:        Constitutional    Fever or chills:     PHYSICAL EXAM:   Vitals:   11/14/18 7412 11/14/18 8786  BP: (!) 178/122 (!) 168/115  Pulse: 77   Resp: 16   Temp: 98.5 F (36.9 C)   Weight: 200 lb 1.1 oz (90.8 kg)   Height: 5'  10" (1.778 m)     GENERAL: The patient is a well-nourished male, in no acute distress. The vital signs are documented above. CARDIAC: There is a regular rate and rhythm.  VASCULAR: Small pulsatile bulge in the right temple area.  I evaluated with this with sono site and it does appear to be a small aneurysm. PULMONARY: Nonlabored respirations MUSCULOSKELETAL: There are no major deformities or cyanosis. NEUROLOGIC: No focal weakness or paresthesias are detected. SKIN: There are no ulcers or rashes noted. PSYCHIATRIC: The patient has a normal affect.  STUDIES:   I used the SonoSite ultrasound to evaluate this area which did appear to be pulsatile  ASSESSMENT and PLAN   Right temporal artery aneurysm/pseudoaneurysm: I discussed with the patient that because this area is painful to him and tender to the touch, in addition to the fact that he feels that it is getting bigger, I feel that we should just go ahead and resect the temporal artery.  I was very clear with him that I do not know if this will alleviate his pain, but I feel there is very little downside.  He is in agreement and wants to get this done.  I scheduled him for Thursday, March 5.   Durene Cal, MD [facility] [phone] Pager [phone]

## 2018-11-14 NOTE — H&P (View-Only) (Signed)
[facility] and Vein Specialist of [facility]  Patient name: Corey Gilbert MRN: [id number] DOB: Mar 10, 1966 Sex: male   REQUESTING PROVIDER:    Stanford Breed   REASON FOR CONSULT:    Right temporal pain  HISTORY OF PRESENT ILLNESS:   Corey Gilbert is a 53 y.o. male, who is referred today for evaluation of right temporal pain.  Apparently back in July 2019 there was concern over possible giant cell arteritis because the patient was having pain in his right temporal area as well as some vision changes.  According to the patient his testing came back negative.  He has had persistent pain in this area since that time.  By his report he has had CAT scans and MRIs which have been unremarkable.  He states that migraine medication does not help his problems.  He only gets relief from muscle relaxers was just make him go to sleep.  Patient grew up as a boxer.  He has had multiple concussions.  He has mild cognitive impairment with memory loss.  He has a history of hepatitis C, treated with Harvoni.  He is also been diagnosed with bipolar disorder.  He is medically managed for hypertension.  He does have a history of bundle branch block.  PAST MEDICAL HISTORY    Past Medical History:  Diagnosis Date  . Anxiety   . Arthritis   . Bipolar 1 disorder (HCC)   . Bipolar disorder (HCC)   . Brain injuries (HCC)   . Complication of anesthesia   . Concussion   . Depression   . Family history of anesthesia complication    pon& v  . Headache   . Hepatitis    Hepatitis C - has been treated with Harvoni  . Kidney stones   . Near syncope    due to opain, last time  early Sept 2013  . PONV (postoperative nausea and vomiting)    not on most recent surgery     FAMILY HISTORY   Family History  Problem Relation Age of Onset  . Arrhythmia Mother     SOCIAL HISTORY:   Social History   Socioeconomic History  . Marital status: Married    Spouse name: Not on  file  . Number of children: 1  . Years of education: college  . Highest education level: Not on file  Occupational History  . Occupation: Unemployed Copy  Social Needs  . Financial resource strain: Not on file  . Food insecurity:    Worry: Not on file    Inability: Not on file  . Transportation needs:    Medical: Not on file    Non-medical: Not on file  Tobacco Use  . Smoking status: Never Smoker  . Smokeless tobacco: Never Used  Substance and Sexual Activity  . Alcohol use: No    Alcohol/week: 0.0 standard drinks    Comment: rarely  . Drug use: Yes    Types: Marijuana, Oxycodone    Comment: uses marijuana to help bipolar  . Sexual activity: Yes  Lifestyle  . Physical activity:    Days per week: Not on file    Minutes per session: Not on file  . Stress: Not on file  Relationships  . Social connections:    Talks on phone: Not on file    Gets together: Not on file    Attends religious service: Not on file    Active member of club or organization: Not on file  Attends meetings of clubs or organizations: Not on file    Relationship status: Not on file  . Intimate partner violence:    Fear of current or ex partner: Not on file    Emotionally abused: Not on file    Physically abused: Not on file    Forced sexual activity: Not on file  Other Topics Concern  . Not on file  Social History Narrative   Lives in wife and son in [place].    Drinks about 1 cup of coffee a day     ALLERGIES:    Allergies  Allergen Reactions  . Lyrica [Pregabalin] Other (See Comments)    Mania  . Neurontin [Gabapentin] Other (See Comments)    Goes manic  . Toradol [Ketorolac Tromethamine] Other (See Comments)    Reacts with other medications; also, fever and mania  . Tylenol [Acetaminophen] Other (See Comments)    Avoids due to history of Hepatitis C    CURRENT MEDICATIONS:    Current Outpatient Medications  Medication Sig Dispense Refill  .  aspirin-acetaminophen-caffeine (EXCEDRIN MIGRAINE) 250-250-65 MG per tablet Take 1 tablet by mouth every 6 (six) hours as needed for migraine.     . Camphor-Menthol-Methyl Sal (TIGER BALM MUSCLE RUB EX) Apply 1 application topically as needed (for pain).    . cyclobenzaprine (FLEXERIL) 10 MG tablet Take 10 mg by mouth every 8 (eight) hours as needed (pain).   0  . lisinopril-hydrochlorothiazide (PRINZIDE,ZESTORETIC) 20-25 MG tablet Take 1 tablet by mouth daily.    . SUMAtriptan (IMITREX) 50 MG tablet Take one tablet at onset of headache, may repeat once after 2 hours if headache recurs.     No current facility-administered medications for this visit.     REVIEW OF SYSTEMS:   [X]  denotes positive finding, [ ]  denotes negative finding Cardiac  Comments:  Chest pain or chest pressure:    Shortness of breath upon exertion:    Short of breath when lying flat:    Irregular heart rhythm:        Vascular    Pain in calf, thigh, or hip brought on by ambulation:    Pain in feet at night that wakes you up from your sleep:     Blood clot in your veins:    Leg swelling:         Pulmonary    Oxygen at home:    Productive cough:     Wheezing:         Neurologic    Sudden weakness in arms or legs:     Sudden numbness in arms or legs:     Sudden onset of difficulty speaking or slurred speech:    Temporary loss of vision in one eye:     Problems with dizziness:         Gastrointestinal    Blood in stool:      Vomited blood:         Genitourinary    Burning when urinating:     Blood in urine:        Psychiatric    Major depression:         Hematologic    Bleeding problems:    Problems with blood clotting too easily:        Skin    Rashes or ulcers:        Constitutional    Fever or chills:     PHYSICAL EXAM:   Vitals:   11/14/18 7412 11/14/18 8786  BP: (!) 178/122 (!) 168/115  Pulse: 77   Resp: 16   Temp: 98.5 F (36.9 C)   Weight: 200 lb 1.1 oz (90.8 kg)   Height: 5'  10" (1.778 m)     GENERAL: The patient is a well-nourished male, in no acute distress. The vital signs are documented above. CARDIAC: There is a regular rate and rhythm.  VASCULAR: Small pulsatile bulge in the right temple area.  I evaluated with this with sono site and it does appear to be a small aneurysm. PULMONARY: Nonlabored respirations MUSCULOSKELETAL: There are no major deformities or cyanosis. NEUROLOGIC: No focal weakness or paresthesias are detected. SKIN: There are no ulcers or rashes noted. PSYCHIATRIC: The patient has a normal affect.  STUDIES:   I used the SonoSite ultrasound to evaluate this area which did appear to be pulsatile  ASSESSMENT and PLAN   Right temporal artery aneurysm/pseudoaneurysm: I discussed with the patient that because this area is painful to him and tender to the touch, in addition to the fact that he feels that it is getting bigger, I feel that we should just go ahead and resect the temporal artery.  I was very clear with him that I do not know if this will alleviate his pain, but I feel there is very little downside.  He is in agreement and wants to get this done.  I scheduled him for Thursday, March 5.   Durene Cal, MD [facility] [phone] Pager [phone]

## 2018-11-17 NOTE — Pre-Procedure Instructions (Signed)
Corey Gilbert  11/17/2018      [facility] [address] [place] - [address] [place] [address] Phone: [phone] Fax: [phone]  [id number] Barbaraann Cao, [place] - [address] AT [facility] [address] [place] [address] Phone: [phone] Fax: [phone]  [facility] - [place], [place] - [address] [place] [address] Phone: [phone] Fax: [phone]    Your procedure is scheduled on March 5.  Report to [facility] section At 5:30 A.M.  Call this number if you have problems the morning of surgery:  [phone]   Remember:  Do not eat or drink after midnight.      Take these medicines the morning of surgery with A SIP OF WATER:              Cyclobenzaprine (flexeril) if needed             Sumatriptan (imitrex)               7 days prior to surgery STOP taking any Aspirin (unless otherwise instructed by your surgeon), Aleve, Naproxen, Ibuprofen, Motrin, Advil, Goody's, BC's, all herbal medications, fish oil, and all vitamins, excedrine migraine.    Do not wear jewelry.  Do not wear lotions, powders, or perfumes, or deodorant.  Do not shave 48 hours prior to surgery.  Men may shave face and neck.  Do not bring valuables to the hospital.  [facility] is not responsible for any belongings or valuables.  Contacts, dentures or bridgework may not be worn into surgery.  Leave your suitcase in the car.  After surgery it may be brought to your room.  For patients admitted to the hospital, discharge time will be determined by your treatment team.  Patients discharged the day of surgery will not be allowed to drive home.    Special instructions:  [place]- Preparing For Surgery  Before surgery, you can play an important role. Because skin is not sterile, your skin needs to be as free of germs as possible. You can reduce the number of germs on your skin  by washing with CHG (chlorahexidine gluconate) Soap before surgery.  CHG is an antiseptic cleaner which kills germs and bonds with the skin to continue killing germs even after washing.    Oral Hygiene is also important to reduce your risk of infection.  Remember - BRUSH YOUR TEETH THE MORNING OF SURGERY WITH YOUR REGULAR TOOTHPASTE  Please do not use if you have an allergy to CHG or antibacterial soaps. If your skin becomes reddened/irritated stop using the CHG.  Do not shave (including legs and underarms) for at least 48 hours prior to first CHG shower. It is OK to shave your face.  Please follow these instructions carefully.   1. Shower the NIGHT BEFORE SURGERY and the MORNING OF SURGERY with CHG.   2. If you chose to wash your hair, wash your hair first as usual with your normal shampoo.  3. After you shampoo, rinse your hair and body thoroughly to remove the shampoo.  4. Use CHG as you would any other liquid soap. You can apply CHG directly to the skin and wash gently with a scrungie or a clean washcloth.   5. Apply the CHG Soap to your body ONLY FROM THE NECK DOWN.  Do not use on open wounds  or open sores. Avoid contact with your eyes, ears, mouth and genitals (private parts). Wash Face and genitals (private parts)  with your normal soap.  6. Wash thoroughly, paying special attention to the area where your surgery will be performed.  7. Thoroughly rinse your body with warm water from the neck down.  8. DO NOT shower/wash with your normal soap after using and rinsing off the CHG Soap.  9. Pat yourself dry with a CLEAN TOWEL.  10. Wear CLEAN PAJAMAS to bed the night before surgery, wear comfortable clothes the morning of surgery  11. Place CLEAN SHEETS on your bed the night of your first shower and DO NOT SLEEP WITH PETS.    Day of Surgery:  Do not apply any deodorants/lotions.  Please wear clean clothes to the hospital/surgery center.   Remember to brush your teeth WITH YOUR  REGULAR TOOTHPASTE.    Please read over the following fact sheets that you were given. Coughing and Deep Breathing and Surgical Site Infection Prevention

## 2018-11-18 ENCOUNTER — Inpatient Hospital Stay (HOSPITAL_COMMUNITY)
Admission: RE | Admit: 2018-11-18 | Discharge: 2018-11-18 | Disposition: A | Discharge status: Home / Self Care | Payer: Medicaid Other | Source: Ambulatory Visit

## 2018-11-18 NOTE — Progress Notes (Signed)
Patient stated that he lives 2 hours away and does not want to come for a pre-admission appointment.  Patient is canceling his pre-admission appointment for today.

## 2018-11-18 NOTE — Progress Notes (Addendum)
PCP - not in system Cardiologist -   Chest x-ray -  EKG - 06/2018 -requested from [facility]  Stress Test - 2017 in EPIC ECHO -   Cardiac Cath -   Sleep Study -  CPAP -   Fasting Blood Sugar - n/a Checks Blood Sugar _____ times a day-n/a  Blood Thinner Instructions: n/a Aspirin Instructions: n/a  Anesthesia review: pending EKG  Patient denies shortness of breath, fever, cough and chest pain at PAT appointment  Patient verbalized understanding of instructions that were given to them at the PAT appointment. Patient was also instructed that they will need to review over the PAT instructions again at home before surgery.  [facility]-phone [phone] Fax: [phone]

## 2018-11-22 ENCOUNTER — Encounter (HOSPITAL_COMMUNITY): Payer: Self-pay

## 2018-11-23 NOTE — Anesthesia Preprocedure Evaluation (Addendum)
Anesthesia Evaluation  Patient identified by MRN, date of birth, ID band Patient awake    Reviewed: Allergy & Precautions, NPO status , Patient's Chart, lab work & pertinent test results  History of Anesthesia Complications (+) PONV  Airway Mallampati: II  TM Distance: >3 FB Neck ROM: Limited    Dental  (+) Teeth Intact, Dental Advisory Given   Pulmonary    Pulmonary exam normal        Cardiovascular hypertension, Pt. on medications Normal cardiovascular exam     Neuro/Psych Anxiety Depression Bipolar Disorder    GI/Hepatic (+) Hepatitis -, C  Endo/Other    Renal/GU      Musculoskeletal   Abdominal   Peds  Hematology   Anesthesia Other Findings   Reproductive/Obstetrics                           Anesthesia Physical Anesthesia Plan  ASA: II  Anesthesia Plan: General   Post-op Pain Management:    Induction: Intravenous  PONV Risk Score and Plan: 2 and Ondansetron, Dexamethasone and Midazolam  Airway Management Planned: LMA  Additional Equipment: None  Intra-op Plan:   Post-operative Plan: Extubation in OR  Informed Consent: I have reviewed the patients History and Physical, chart, labs and discussed the procedure including the risks, benefits and alternatives for the proposed anesthesia with the patient or authorized representative who has indicated his/her understanding and acceptance.     Dental advisory given  Plan Discussed with: CRNA and Surgeon  Anesthesia Plan Comments: (Last 2 recorded BPs in Epic are from an OV at [facility] with pressures of 168/115 and 178/122. Unfortunately he did not come to his PAT appt so we were not able to check his BP, preop was done via phone call. Review of OV with PCP in care everywhere 09/27/18 shows BP: 144/99 132/82 manual. His only antiHTN med is a combo lisinopril/hctz so this will be held on DOS per guidelines.  Dr. Myra Gianotti aware of pt's  pressure and possibility of cancellation if prohibitively high on DOS. EKG 07/10/18 from PCP shows NSR, rate 90, RBBB, septal infarct age undetermined, cannot rule out anterior infarct age undetermined. Does not appear significantly changed from tracing 11/01/15.)     Anesthesia Quick Evaluation

## 2018-11-24 ENCOUNTER — Ambulatory Visit (HOSPITAL_COMMUNITY): Payer: Medicaid Other | Admitting: Physician Assistant

## 2018-11-24 ENCOUNTER — Other Ambulatory Visit: Payer: Self-pay

## 2018-11-24 ENCOUNTER — Encounter (HOSPITAL_COMMUNITY)
Admission: RE | Disposition: A | Discharge status: Home / Self Care | Payer: Self-pay | Source: Home / Self Care | Attending: Surgery

## 2018-11-24 ENCOUNTER — Ambulatory Visit (HOSPITAL_COMMUNITY)
Admission: RE | Admit: 2018-11-24 | Discharge: 2018-11-24 | Disposition: A | Discharge status: Home / Self Care | Payer: Medicaid Other | Attending: Surgery | Admitting: Surgery

## 2018-11-24 ENCOUNTER — Encounter (HOSPITAL_COMMUNITY): Payer: Self-pay

## 2018-11-24 DIAGNOSIS — F419 Anxiety disorder, unspecified: Secondary | ICD-10-CM | POA: Insufficient documentation

## 2018-11-24 DIAGNOSIS — M316 Other giant cell arteritis: Secondary | ICD-10-CM | POA: Insufficient documentation

## 2018-11-24 DIAGNOSIS — Z79899 Other long term (current) drug therapy: Secondary | ICD-10-CM | POA: Diagnosis not present

## 2018-11-24 DIAGNOSIS — G3184 Mild cognitive impairment, so stated: Secondary | ICD-10-CM | POA: Diagnosis not present

## 2018-11-24 DIAGNOSIS — F319 Bipolar disorder, unspecified: Secondary | ICD-10-CM | POA: Diagnosis not present

## 2018-11-24 DIAGNOSIS — I10 Essential (primary) hypertension: Secondary | ICD-10-CM | POA: Insufficient documentation

## 2018-11-24 DIAGNOSIS — I728 Aneurysm of other specified arteries: Secondary | ICD-10-CM | POA: Insufficient documentation

## 2018-11-24 DIAGNOSIS — M1990 Unspecified osteoarthritis, unspecified site: Secondary | ICD-10-CM | POA: Diagnosis not present

## 2018-11-24 DIAGNOSIS — R51 Headache: Secondary | ICD-10-CM

## 2018-11-24 HISTORY — PX: ARTERY BIOPSY: SHX891

## 2018-11-24 HISTORY — DX: Personal history of urinary calculi: Z87.442

## 2018-11-24 HISTORY — DX: Essential (primary) hypertension: I10

## 2018-11-24 LAB — COMPREHENSIVE METABOLIC PANEL
ALT: 17 U/L (ref 0–44)
AST: 22 U/L (ref 15–41)
Albumin: 3.8 g/dL (ref 3.5–5.0)
Alkaline Phosphatase: 48 U/L (ref 38–126)
Anion gap: 11 (ref 5–15)
BUN: 13 mg/dL (ref 6–20)
CO2: 25 mmol/L (ref 22–32)
Calcium: 9.5 mg/dL (ref 8.9–10.3)
Chloride: 99 mmol/L (ref 98–111)
Creatinine, Ser: 1.07 mg/dL (ref 0.61–1.24)
GFR calc Af Amer: >60 mL/min (ref >60)
GFR calc non Af Amer: >60 mL/min (ref >60)
Glucose, Bld: 102 mg/dL — ABNORMAL HIGH (ref 70–99)
Potassium: 3.8 mmol/L (ref 3.5–5.1)
Sodium: 135 mmol/L (ref 135–145)
Total Bilirubin: 0.4 mg/dL (ref 0.3–1.2)
Total Protein: 6.9 g/dL (ref 6.5–8.1)

## 2018-11-24 LAB — CBC
HCT: 45.7 % (ref 39.0–52.0)
Hemoglobin: 15.4 g/dL (ref 13.0–17.0)
MCH: 30 pg (ref 26.0–34.0)
MCHC: 33.7 g/dL (ref 30.0–36.0)
MCV: 89.1 fL (ref 80.0–100.0)
Platelets: 145 10*3/uL — ABNORMAL LOW (ref 150–400)
RBC: 5.13 MIL/uL (ref 4.22–5.81)
RDW: 12.5 % (ref 11.5–15.5)
WBC: 6.2 10*3/uL (ref 4.0–10.5)
nRBC: 0 % (ref 0.0–0.2)

## 2018-11-24 LAB — SURGICAL PCR SCREEN
MRSA, PCR: NEGATIVE
Staphylococcus aureus: NEGATIVE

## 2018-11-24 SURGERY — BIOPSY TEMPORAL ARTERY
Anesthesia: General | Site: Face | Laterality: Right

## 2018-11-24 MED ORDER — FENTANYL CITRATE (PF) 250 MCG/5ML IJ SOLN
INTRAMUSCULAR | Status: AC
  Filled 2018-11-24: qty 5

## 2018-11-24 MED ORDER — FENTANYL CITRATE (PF) 100 MCG/2ML IJ SOLN
INTRAMUSCULAR | Status: DC | PRN
  Administered 2018-11-24 (×2): 50 ug via INTRAVENOUS
  Administered 2018-11-24: 100 ug via INTRAVENOUS
  Administered 2018-11-24: 50 ug via INTRAVENOUS

## 2018-11-24 MED ORDER — CHLORHEXIDINE GLUCONATE 4 % EX LIQD: 60.0000 mL | Freq: Once | CUTANEOUS | Status: DC

## 2018-11-24 MED ORDER — ONDANSETRON HCL 4 MG/2ML IJ SOLN: 4.0000 mg | Freq: Once | INTRAMUSCULAR | Status: DC | PRN

## 2018-11-24 MED ORDER — LABETALOL HCL 5 MG/ML IV SOLN
INTRAVENOUS | Status: DC | PRN
  Administered 2018-11-24 (×2): 5 mg via INTRAVENOUS

## 2018-11-24 MED ORDER — MUPIROCIN 2 % EX OINT
TOPICAL_OINTMENT | CUTANEOUS | Status: AC
  Administered 2018-11-24: 1 via TOPICAL
  Filled 2018-11-24: qty 22

## 2018-11-24 MED ORDER — MEPERIDINE HCL 50 MG/ML IJ SOLN: 6.2500 mg | INTRAMUSCULAR | Status: DC | PRN

## 2018-11-24 MED ORDER — MIDAZOLAM HCL 5 MG/5ML IJ SOLN
INTRAMUSCULAR | Status: DC | PRN
  Administered 2018-11-24 (×2): 1 mg via INTRAVENOUS

## 2018-11-24 MED ORDER — HYDROCHLOROTHIAZIDE 25 MG PO TABS: 25.0000 mg | ORAL_TABLET | Freq: Once | ORAL | Status: DC

## 2018-11-24 MED ORDER — MUPIROCIN 2 % EX OINT
1.0000 "application " | TOPICAL_OINTMENT | Freq: Once | CUTANEOUS | Status: AC
  Administered 2018-11-24: 1 via TOPICAL

## 2018-11-24 MED ORDER — LACTATED RINGERS IV SOLN
INTRAVENOUS | Status: DC | PRN
  Administered 2018-11-24: 07:00:00 via INTRAVENOUS

## 2018-11-24 MED ORDER — LABETALOL HCL 5 MG/ML IV SOLN
INTRAVENOUS | Status: AC
  Filled 2018-11-24: qty 4

## 2018-11-24 MED ORDER — LIDOCAINE HCL (PF) 1 % IJ SOLN
INTRAMUSCULAR | Status: AC
  Filled 2018-11-24: qty 30

## 2018-11-24 MED ORDER — PROPOFOL 10 MG/ML IV BOLUS
INTRAVENOUS | Status: DC | PRN
  Administered 2018-11-24: 160 mg via INTRAVENOUS

## 2018-11-24 MED ORDER — HYDROMORPHONE HCL 1 MG/ML IJ SOLN
0.2500 mg | INTRAMUSCULAR | Status: DC | PRN
  Administered 2018-11-24 (×2): 0.5 mg via INTRAVENOUS

## 2018-11-24 MED ORDER — DEXAMETHASONE SODIUM PHOSPHATE 10 MG/ML IJ SOLN
INTRAMUSCULAR | Status: DC | PRN
  Administered 2018-11-24: 4 mg via INTRAVENOUS

## 2018-11-24 MED ORDER — SODIUM CHLORIDE 0.9 % IV SOLN: INTRAVENOUS | Status: DC

## 2018-11-24 MED ORDER — HYDROMORPHONE HCL 1 MG/ML IJ SOLN
INTRAMUSCULAR | Status: AC
  Filled 2018-11-24: qty 1

## 2018-11-24 MED ORDER — 0.9 % SODIUM CHLORIDE (POUR BTL) OPTIME
TOPICAL | Status: DC | PRN
  Administered 2018-11-24: 1000 mL

## 2018-11-24 MED ORDER — LISINOPRIL 20 MG PO TABS: 20.0000 mg | ORAL_TABLET | Freq: Every day | ORAL | Status: DC

## 2018-11-24 MED ORDER — CEFAZOLIN SODIUM-DEXTROSE 2-4 GM/100ML-% IV SOLN
INTRAVENOUS | Status: AC
  Filled 2018-11-24: qty 100

## 2018-11-24 MED ORDER — MIDAZOLAM HCL 2 MG/2ML IJ SOLN
INTRAMUSCULAR | Status: AC
  Filled 2018-11-24: qty 2

## 2018-11-24 MED ORDER — PROPOFOL 10 MG/ML IV BOLUS
INTRAVENOUS | Status: AC
  Filled 2018-11-24: qty 20

## 2018-11-24 MED ORDER — OXYCODONE HCL 5 MG PO TABS: 5.0000 mg | ORAL_TABLET | Freq: Three times a day (TID) | ORAL | 0 refills | Status: AC | PRN

## 2018-11-24 MED ORDER — LABETALOL HCL 5 MG/ML IV SOLN
10.0000 mg | Freq: Once | INTRAVENOUS | Status: AC
  Administered 2018-11-24: 10 mg via INTRAVENOUS

## 2018-11-24 MED ORDER — LIDOCAINE 2% (20 MG/ML) 5 ML SYRINGE
INTRAMUSCULAR | Status: DC | PRN
  Administered 2018-11-24: 100 mg via INTRAVENOUS

## 2018-11-24 MED ORDER — LISINOPRIL 20 MG PO TABS: 20.0000 mg | ORAL_TABLET | Freq: Once | ORAL | Status: DC

## 2018-11-24 MED ORDER — CEFAZOLIN SODIUM-DEXTROSE 2-4 GM/100ML-% IV SOLN
2.0000 g | INTRAVENOUS | Status: AC
  Administered 2018-11-24: 2 g via INTRAVENOUS

## 2018-11-24 MED ORDER — ONDANSETRON HCL 4 MG/2ML IJ SOLN
INTRAMUSCULAR | Status: DC | PRN
  Administered 2018-11-24: 4 mg via INTRAVENOUS

## 2018-11-24 SURGICAL SUPPLY — 47 items
BRUSH SCRUB EZ PLAIN DRY (MISCELLANEOUS) ×3 IMPLANT
CANISTER SUCT 3000ML PPV (MISCELLANEOUS) ×3 IMPLANT
CLIP VESOCCLUDE SM WIDE 6/CT (CLIP) ×3 IMPLANT
CONT SPEC 4OZ CLIKSEAL STRL BL (MISCELLANEOUS) ×3 IMPLANT
COTTONBALL LRG STERILE PKG (GAUZE/BANDAGES/DRESSINGS) ×3 IMPLANT
COVER PROBE W GEL 5X96 (DRAPES) ×3 IMPLANT
COVER SURGICAL LIGHT HANDLE (MISCELLANEOUS) IMPLANT
COVER WAND RF STERILE (DRAPES) ×3 IMPLANT
DECANTER SPIKE VIAL GLASS SM (MISCELLANEOUS) ×3 IMPLANT
DERMABOND ADVANCED (GAUZE/BANDAGES/DRESSINGS) ×2
DERMABOND ADVANCED .7 DNX12 (GAUZE/BANDAGES/DRESSINGS) ×1 IMPLANT
DRAPE BRACHIAL (DRAPES) ×3 IMPLANT
DRAPE HALF SHEET 40X57 (DRAPES) IMPLANT
DRAPE SURG 17X23 STRL (DRAPES) ×6 IMPLANT
ELECT CAUTERY BLADE 6.4 (BLADE) ×3 IMPLANT
ELECT REM PT RETURN 9FT ADLT (ELECTROSURGICAL)
ELECTRODE REM PT RTRN 9FT ADLT (ELECTROSURGICAL) IMPLANT
GLOVE BIOGEL PI IND STRL 6.5 (GLOVE) ×2 IMPLANT
GLOVE BIOGEL PI IND STRL 7.5 (GLOVE) ×1 IMPLANT
GLOVE BIOGEL PI INDICATOR 6.5 (GLOVE) ×4
GLOVE BIOGEL PI INDICATOR 7.5 (GLOVE) ×2
GLOVE SS N UNI LF 7.5 STRL (GLOVE) ×3 IMPLANT
GOWN STRL NON-REIN LRG LVL3 (GOWN DISPOSABLE) ×3 IMPLANT
GOWN STRL REUS W/ TWL LRG LVL3 (GOWN DISPOSABLE) ×1 IMPLANT
GOWN STRL REUS W/ TWL XL LVL3 (GOWN DISPOSABLE) ×1 IMPLANT
GOWN STRL REUS W/TWL LRG LVL3 (GOWN DISPOSABLE) ×2
GOWN STRL REUS W/TWL XL LVL3 (GOWN DISPOSABLE) ×2
KIT BASIN OR (CUSTOM PROCEDURE TRAY) ×3 IMPLANT
KIT TURNOVER KIT B (KITS) ×3 IMPLANT
LOOP VESSEL MINI RED (MISCELLANEOUS) ×3 IMPLANT
NEEDLE HYPO 25GX1X1/2 BEV (NEEDLE) ×3 IMPLANT
NS IRRIG 1000ML POUR BTL (IV SOLUTION) ×3 IMPLANT
PACK CV ACCESS (CUSTOM PROCEDURE TRAY) ×3 IMPLANT
PACK GENERAL/GYN (CUSTOM PROCEDURE TRAY) IMPLANT
PAD ARMBOARD 7.5X6 YLW CONV (MISCELLANEOUS) ×6 IMPLANT
SPONGE LAP 4X18 RFD (DISPOSABLE) ×3 IMPLANT
SUCTION FRAZIER HANDLE 10FR (MISCELLANEOUS) ×2
SUCTION TUBE FRAZIER 10FR DISP (MISCELLANEOUS) ×1 IMPLANT
SUT PROLENE 6 0 BV (SUTURE) IMPLANT
SUT SILK 3 0 (SUTURE) ×2
SUT SILK 3-0 18XBRD TIE 12 (SUTURE) ×1 IMPLANT
SUT VIC AB 3-0 SH 27 (SUTURE) ×2
SUT VIC AB 3-0 SH 27X BRD (SUTURE) ×1 IMPLANT
SUT VICRYL 4-0 PS2 18IN ABS (SUTURE) ×3 IMPLANT
SYR CONTROL 10ML LL (SYRINGE) ×3 IMPLANT
TOWEL GREEN STERILE (TOWEL DISPOSABLE) ×3 IMPLANT
WATER STERILE IRR 1000ML POUR (IV SOLUTION) ×3 IMPLANT

## 2018-11-24 NOTE — Progress Notes (Signed)
Patient extremely upset with elevated BP and the decision that [facility] will not discharge with DBP of 110-120. IV Labetalol given and attempted to start home BP medication.  Patient became  increasingly angry, violent, and threatening to staff. " If we don't get him out of here soon, something bad will be happening." Dr Michelle Piper made aware. Will not discharge him at this time, pt's wife signing AMA paperwork. Wife at bedside- aware that pt is leaving against medical advice.

## 2018-11-24 NOTE — Progress Notes (Signed)
Pt's BP elevated; last BP 148/110. Dr. Michelle Piper made aware. No new orders at this time.

## 2018-11-24 NOTE — Interval H&P Note (Signed)
History and Physical Interval Note:  11/24/2018 7:27 AM  Corey Gilbert  has presented today for surgery, with the diagnosis of RIGHT TEMPORAL PAIN  The various methods of treatment have been discussed with the patient and family. After consideration of risks, benefits and other options for treatment, the patient has consented to  Procedure(s): RIGHT TEMPORAL ARTERY ANEURYSM RESECTION (Right) as a surgical intervention .  The patient's history has been reviewed, patient examined, no change in status, stable for surgery.  I have reviewed the patient's chart and labs.  Questions were answered to the patient's satisfaction.     Durene Cal

## 2018-11-24 NOTE — Anesthesia Procedure Notes (Signed)
Procedure Name: LMA Insertion Date/Time: 11/24/2018 8:16 AM Performed by: Shireen Quan, CRNA Pre-anesthesia Checklist: Patient identified, Emergency Drugs available, Suction available and Patient being monitored Patient Re-evaluated:Patient Re-evaluated prior to induction Oxygen Delivery Method: Circle System Utilized Preoxygenation: Pre-oxygenation with 100% oxygen Induction Type: IV induction Ventilation: Mask ventilation without difficulty LMA: LMA inserted LMA Size: 5.0 Number of attempts: 1 Placement Confirmation: positive ETCO2 Tube secured with: Tape Dental Injury: Teeth and Oropharynx as per pre-operative assessment

## 2018-11-24 NOTE — Op Note (Signed)
    Patient name: Corey Gilbert MRN: [id number] DOB: 11-24-1965 Sex: male  11/24/2018 Pre-operative Diagnosis: Right temporal artery aneurysm Post-operative diagnosis:  Same Surgeon:  Durene Cal Assistants:  Emilio Aspen Procedure:   Resection of right temporal artery aneurysm Anesthesia: General Blood Loss: Minimal Specimens: Right temporal artery to pathology  Findings: Right temporal artery aneurysm.  Significant inflammatory reaction around the temporal artery.  Indications: The patient has a history of boxing.  He has been complaining of right temporal and head pain for many months.  On examination he had a hardened area that I evaluated with ultrasound and felt to be a small aneurysm.  I discussed that I am not sure if this is the cause of his pain but that it should be removed.  Procedure:  The patient was identified in the holding area and taken to [facility] OR ROOM 12  The patient was then placed supine on the table. general anesthesia was administered.  The patient was prepped and draped in the usual sterile fashion.  A time out was called and antibiotics were administered.  Ultrasound was used to map the course of the temporal artery.  A 10 blade was used to make an incision.  I then dissected out the temporal artery.  There was a pretty significant inflammatory reaction around the artery.  I resected approximately 5 cm of the temporal artery including the aneurysmal segment.  The artery was ligated with 2-0 silk ties.  Wound was irrigated.  Hemostasis was achieved.  The incision was closed with a layer 3-0 Vicryl in a layer of 4-0 Vicryl followed by Dermabond.  There were no immediate complications.   Disposition: To PACU stable   V. Durene Cal, M.D. [facility] [place] Office: [phone] Pager:  [phone]

## 2018-11-24 NOTE — Anesthesia Postprocedure Evaluation (Signed)
Anesthesia Post Note  Patient: Corey Gilbert  Procedure(s) Performed: RIGHT TEMPORAL ARTERY  RESECTION (Right Face)     Patient location during evaluation: PACU Anesthesia Type: General Level of consciousness: awake and alert Pain management: pain level controlled Vital Signs Assessment: post-procedure vital signs reviewed and stable Respiratory status: spontaneous breathing, nonlabored ventilation, respiratory function stable and patient connected to nasal cannula oxygen Cardiovascular status: blood pressure returned to baseline and stable Postop Assessment: no apparent nausea or vomiting Anesthetic complications: no    Last Vitals:  Vitals:   11/24/18 1009 11/24/18 1015  BP: (!) 162/123 (!) 166/133  Pulse: 72 78  Resp: 17 13  Temp:  (!) 36.4 C  SpO2: 97% 95%    Last Pain:  Vitals:   11/24/18 1015  TempSrc:   PainSc: 8                  Kjerstin Abrigo DAVID

## 2018-11-24 NOTE — Transfer of Care (Signed)
Immediate Anesthesia Transfer of Care Note  Patient: Corey Gilbert  Procedure(s) Performed: RIGHT TEMPORAL ARTERY  RESECTION (Right Face)  Patient Location: PACU  Anesthesia Type:General  Level of Consciousness: awake and patient cooperative  Airway & Oxygen Therapy: Patient Spontanous Breathing  Post-op Assessment: Report given to RN, Post -op Vital signs reviewed and stable and Patient moving all extremities  Post vital signs: Reviewed and stable  Last Vitals:  Vitals Value Taken Time  BP 184/134 11/24/2018  9:23 AM  Temp    Pulse 80 11/24/2018  9:23 AM  Resp 14 11/24/2018  9:23 AM  SpO2 92 % 11/24/2018  9:23 AM  Vitals shown include unvalidated device data.  Last Pain:  Vitals:   11/24/18 0621  TempSrc:   PainSc: 9       Patients Stated Pain Goal: 3 (11/24/18 1478)  Complications: No apparent anesthesia complications

## 2018-11-25 ENCOUNTER — Encounter (HOSPITAL_COMMUNITY): Payer: Self-pay | Admitting: Surgery

## 2018-11-30 ENCOUNTER — Telehealth: Payer: Self-pay | Admitting: Surgery

## 2018-11-30 NOTE — Telephone Encounter (Signed)
I spoke with the on-call physician who was covering for the patient's primary care physician.  I discussed the pathology results consistent with vasculitis.  He is going to contact the patient and start him on appropriate therapy  Wells Jaber Dunlow

## 2020-02-23 ENCOUNTER — Ambulatory Visit: Payer: Medicaid Other | Admitting: Vascular Surgery

## 2020-02-26 ENCOUNTER — Encounter: Payer: Self-pay | Admitting: Surgery

## 2020-02-26 ENCOUNTER — Other Ambulatory Visit: Payer: Self-pay

## 2020-02-26 ENCOUNTER — Ambulatory Visit (INDEPENDENT_AMBULATORY_CARE_PROVIDER_SITE_OTHER): Payer: Medicaid Other | Admitting: Surgery

## 2020-02-26 VITALS — BP 148/97 | HR 99 | Temp 98.0°F | Resp 20 | Ht 70.0 in | Wt 190.0 lb

## 2020-02-26 DIAGNOSIS — I728 Aneurysm of other specified arteries: Secondary | ICD-10-CM

## 2020-02-26 NOTE — Progress Notes (Signed)
 [facility] [place]  Patient name: Corey Gilbert MRN: [id number] DOB: 06/21/1966 Sex: male   REASON FOR VISIT:    Follow up  HISOTRY OF PRESENT ILLNESS:    Corey Gilbert is a 54 y.o. male who returns today for evaluation of left sided headaches.  He had multiple imaging studies including CT scans and MRIs which were unremarkable.  On 11/24/2018 I performed a right temporal artery biopsy.  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  He states that without steroids, his symptoms immediately went away.  He is now having similar symptoms in his left side.  He does notice that the artery appears to be coming more prominent.  It sometimes pushes his eyelids down.  He does not want to go on steroids because of his bipolar disorder.  Reportedly his sed rate is low.  Headaches are almost unbearable.  Patient grew up as a boxer.  He has had multiple concussions.  He has mild cognitive impairment with memory loss.  He has a history of hepatitis C, treated with Harvoni.  He is also been diagnosed with bipolar disorder.  He is medically managed for hypertension.  He does have a history of bundle branch block. PAST MEDICAL HISTORY:   Past Medical History:  Diagnosis Date  . Anxiety   . Arthritis   . Bipolar 1 disorder (HCC)   . Bipolar disorder (HCC)   . Brain injuries (HCC)   . Complication of anesthesia   . Concussion   . Depression   . Family history of anesthesia complication    pon& v  . Headache   . Hepatitis    Hepatitis C - has been treated with Harvoni  . History of kidney stones   . Hypertension   . Kidney stones   . Near syncope    due to opain, last time  early Sept 2013  . PONV (postoperative nausea and vomiting)    not on most recent surgery     FAMILY HISTORY:   Family History  Problem Relation Age of Onset  . Arrhythmia Mother     SOCIAL HISTORY:   Social History   Tobacco Use  . Smoking status:  Never Smoker  . Smokeless tobacco: Never Used  Substance Use Topics  . Alcohol use: No    Alcohol/week: 0.0 standard drinks    Comment: rarely     ALLERGIES:   Allergies  Allergen Reactions  . Lyrica [Pregabalin] Other (See Comments)    Mania  . Neurontin [Gabapentin] Other (See Comments)    Goes manic  . Toradol [Ketorolac Tromethamine] Other (See Comments)    Reacts with other medications; also, fever and mania  . Tylenol [Acetaminophen] Other (See Comments)    Avoids due to history of Hepatitis C     CURRENT MEDICATIONS:   Current Outpatient Medications  Medication Sig Dispense Refill  . aspirin-acetaminophen-caffeine (EXCEDRIN MIGRAINE) 250-250-65 MG per tablet Take 1 tablet by mouth every 6 (six) hours as needed for migraine.     . Camphor-Menthol-Methyl Sal (TIGER BALM MUSCLE RUB EX) Apply 1 application topically as needed (for pain).    . cyclobenzaprine (FLEXERIL) 10 MG tablet Take 10 mg by mouth at bedtime as needed (pain).   0  . lisinopril-hydrochlorothiazide (PRINZIDE,ZESTORETIC) 20-25 MG tablet Take 1 tablet by mouth daily.    . naproxen (NAPROSYN) 500 MG tablet Take 500 mg by mouth 2 (two) times daily as needed for moderate pain.     No   current facility-administered medications for this visit.    REVIEW OF SYSTEMS:   [X]  denotes positive finding, [ ]  denotes negative finding Cardiac  Comments:  Chest pain or chest pressure:    Shortness of breath upon exertion:    Short of breath when lying flat:    Irregular heart rhythm:        Vascular    Pain in calf, thigh, or hip brought on by ambulation:    Pain in feet at night that wakes you up from your sleep:     Blood clot in your veins:    Leg swelling:         Pulmonary    Oxygen at home:    Productive cough:     Wheezing:         Neurologic    Sudden weakness in arms or legs:     Sudden numbness in arms or legs:     Sudden onset of difficulty speaking or slurred speech:    Temporary loss of  vision in one eye:     Problems with dizziness:         Gastrointestinal    Blood in stool:     Vomited blood:         Genitourinary    Burning when urinating:     Blood in urine:        Psychiatric    Major depression:         Hematologic    Bleeding problems:    Problems with blood clotting too easily:        Skin    Rashes or ulcers:        Constitutional    Fever or chills:      PHYSICAL EXAM:   Vitals:   02/26/20 1436  BP: (!) 148/97  Pulse: 99  Resp: 20  Temp: 98 F (36.7 C)  SpO2: 96%  Weight: 190 lb (86.2 kg)  Height: 5\' 10"  (1.778 m)    GENERAL: The patient is a well-nourished male, in no acute distress. The vital signs are documented above. CARDIAC: There is a regular rate and rhythm.  VASCULAR: Prominent appearing temporal artery PULMONARY: Non-labored respirations ABDOMEN: Soft and non-tender with normal pitched bowel sounds.  NEUROLOGIC: No focal weakness or paresthesias are detected. SKIN: There are no ulcers or rashes noted. PSYCHIATRIC: The patient has a normal affect.  STUDIES:   None  MEDICAL ISSUES:   Left temporal headache: I discussed with the patient that I would be willing to resect the temporal artery on the left since he had immediate relief of his symptoms following resection on the right.  I told him that this may not alleviate his symptoms.  It may not have any impact at all.  However, the artery is very prominent on visual inspection, and he had benefit from removal last time so I think is reasonable to move forward.  I made to get him scheduled for this Wednesday, June 9    04/27/20, MD, [facility] [phone] Pager [phone]

## 2020-02-26 NOTE — H&P (View-Only) (Signed)
[facility] [place]  Patient name: Corey Gilbert MRN: [id number] DOB: [birth year] Sex: male   REASON FOR VISIT:    Follow up  HISOTRY OF PRESENT ILLNESS:    Corey Gilbert is a 54 y.o. male who returns today for evaluation of left sided headaches.  He had multiple imaging studies including CT scans and MRIs which were unremarkable.  On 11/24/2018 I performed a right temporal artery biopsy.  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  He states that without steroids, his symptoms immediately went away.  He is now having similar symptoms in his left side.  He does notice that the artery appears to be coming more prominent.  It sometimes pushes his eyelids down.  He does not want to go on steroids because of his bipolar disorder.  Reportedly his sed rate is low.  Headaches are almost unbearable.  Patient grew up as a boxer.  He has had multiple concussions.  He has mild cognitive impairment with memory loss.  He has a history of hepatitis C, treated with Harvoni.  He is also been diagnosed with bipolar disorder.  He is medically managed for hypertension.  He does have a history of bundle branch block. PAST MEDICAL HISTORY:   Past Medical History:  Diagnosis Date  . Anxiety   . Arthritis   . Bipolar 1 disorder ([place])   . Bipolar disorder ([place])   . Brain injuries ([place])   . Complication of anesthesia   . Concussion   . Depression   . Family history of anesthesia complication    pon& v  . Headache   . Hepatitis    Hepatitis C - has been treated with Harvoni  . History of kidney stones   . Hypertension   . Kidney stones   . Near syncope    due to opain, last time  early Sept 2013  . PONV (postoperative nausea and vomiting)    not on most recent surgery     FAMILY HISTORY:   Family History  Problem Relation Age of Onset  . Arrhythmia Mother     SOCIAL HISTORY:   Social History   Tobacco Use  . Smoking status:  Never Smoker  . Smokeless tobacco: Never Used  Substance Use Topics  . Alcohol use: No    Alcohol/week: 0.0 standard drinks    Comment: rarely     ALLERGIES:   Allergies  Allergen Reactions  . Lyrica [Pregabalin] Other (See Comments)    Mania  . Neurontin [Gabapentin] Other (See Comments)    Goes manic  . Toradol [Ketorolac Tromethamine] Other (See Comments)    Reacts with other medications; also, fever and mania  . Tylenol [Acetaminophen] Other (See Comments)    Avoids due to history of Hepatitis C     CURRENT MEDICATIONS:   Current Outpatient Medications  Medication Sig Dispense Refill  . aspirin-acetaminophen-caffeine (EXCEDRIN MIGRAINE) 250-250-65 MG per tablet Take 1 tablet by mouth every 6 (six) hours as needed for migraine.     . Camphor-Menthol-Methyl Sal (TIGER BALM MUSCLE RUB EX) Apply 1 application topically as needed (for pain).    . cyclobenzaprine (FLEXERIL) 10 MG tablet Take 10 mg by mouth at bedtime as needed (pain).   0  . lisinopril-hydrochlorothiazide (PRINZIDE,ZESTORETIC) 20-25 MG tablet Take 1 tablet by mouth daily.    . naproxen (NAPROSYN) 500 MG tablet Take 500 mg by mouth 2 (two) times daily as needed for moderate pain.     No  current facility-administered medications for this visit.    REVIEW OF SYSTEMS:   [X]  denotes positive finding, [ ]  denotes negative finding Cardiac  Comments:  Chest pain or chest pressure:    Shortness of breath upon exertion:    Short of breath when lying flat:    Irregular heart rhythm:        Vascular    Pain in calf, thigh, or hip brought on by ambulation:    Pain in feet at night that wakes you up from your sleep:     Blood clot in your veins:    Leg swelling:         Pulmonary    Oxygen at home:    Productive cough:     Wheezing:         Neurologic    Sudden weakness in arms or legs:     Sudden numbness in arms or legs:     Sudden onset of difficulty speaking or slurred speech:    Temporary loss of  vision in one eye:     Problems with dizziness:         Gastrointestinal    Blood in stool:     Vomited blood:         Genitourinary    Burning when urinating:     Blood in urine:        Psychiatric    Major depression:         Hematologic    Bleeding problems:    Problems with blood clotting too easily:        Skin    Rashes or ulcers:        Constitutional    Fever or chills:      PHYSICAL EXAM:   Vitals:   02/26/20 1436  BP: (!) 148/97  Pulse: 99  Resp: 20  Temp: 98 F (36.7 C)  SpO2: 96%  Weight: 190 lb (86.2 kg)  Height: 5\' 10"  (1.778 m)    GENERAL: The patient is a well-nourished male, in no acute distress. The vital signs are documented above. CARDIAC: There is a regular rate and rhythm.  VASCULAR: Prominent appearing temporal artery PULMONARY: Non-labored respirations ABDOMEN: Soft and non-tender with normal pitched bowel sounds.  NEUROLOGIC: No focal weakness or paresthesias are detected. SKIN: There are no ulcers or rashes noted. PSYCHIATRIC: The patient has a normal affect.  STUDIES:   None  MEDICAL ISSUES:   Left temporal headache: I discussed with the patient that I would be willing to resect the temporal artery on the left since he had immediate relief of his symptoms following resection on the right.  I told him that this may not alleviate his symptoms.  It may not have any impact at all.  However, the artery is very prominent on visual inspection, and he had benefit from removal last time so I think is reasonable to move forward.  I made to get him scheduled for this Wednesday, June 9    04/27/20, MD, [facility] [phone] Pager [phone]

## 2020-02-27 ENCOUNTER — Encounter (HOSPITAL_COMMUNITY): Payer: Self-pay | Admitting: Surgery

## 2020-02-27 ENCOUNTER — Other Ambulatory Visit (HOSPITAL_COMMUNITY)
Admission: RE | Admit: 2020-02-27 | Discharge: 2020-02-27 | Disposition: A | Discharge status: Home / Self Care | Payer: Medicaid Other | Source: Ambulatory Visit | Attending: Surgery | Admitting: Surgery

## 2020-02-27 ENCOUNTER — Other Ambulatory Visit: Payer: Self-pay

## 2020-02-27 DIAGNOSIS — Z01812 Encounter for preprocedural laboratory examination: Secondary | ICD-10-CM | POA: Diagnosis present

## 2020-02-27 DIAGNOSIS — Z20822 Contact with and (suspected) exposure to covid-19: Secondary | ICD-10-CM | POA: Diagnosis not present

## 2020-02-27 LAB — SARS CORONAVIRUS 2 (TAT 6-24 HRS): SARS Coronavirus 2: NEGATIVE

## 2020-02-27 NOTE — Progress Notes (Signed)
Spoke with pt's wife, Debbe Bales for pre-op call. DPR on file. She states pt does not have a cardiac history and is not diabetic. Pt does have hx of brain injuries and has lots of anxiety. There is a note in the special needs section of the OR schedule about needing his wife in the PACU as he comes out of anesthesia. He can become combative and having his wife there helps him to calm down.  Covid test done today and results pending. She states pt is in quarantine since the test was done and will continue until he comes tomorrow.

## 2020-02-28 ENCOUNTER — Ambulatory Visit (HOSPITAL_COMMUNITY)
Admission: RE | Admit: 2020-02-28 | Discharge: 2020-02-28 | Disposition: A | Discharge status: Home / Self Care | Payer: Medicaid Other | Attending: Surgery | Admitting: Surgery

## 2020-02-28 ENCOUNTER — Inpatient Hospital Stay (HOSPITAL_COMMUNITY): Payer: Medicaid Other | Admitting: Certified Registered Nurse Anesthetist

## 2020-02-28 ENCOUNTER — Encounter (HOSPITAL_COMMUNITY)
Admission: RE | Disposition: A | Discharge status: Home / Self Care | Payer: Self-pay | Source: Home / Self Care | Attending: Surgery

## 2020-02-28 ENCOUNTER — Other Ambulatory Visit: Payer: Self-pay

## 2020-02-28 ENCOUNTER — Encounter (HOSPITAL_COMMUNITY): Payer: Self-pay | Admitting: Surgery

## 2020-02-28 DIAGNOSIS — M1990 Unspecified osteoarthritis, unspecified site: Secondary | ICD-10-CM | POA: Diagnosis not present

## 2020-02-28 DIAGNOSIS — I4510 Unspecified right bundle-branch block: Secondary | ICD-10-CM | POA: Insufficient documentation

## 2020-02-28 DIAGNOSIS — F319 Bipolar disorder, unspecified: Secondary | ICD-10-CM | POA: Insufficient documentation

## 2020-02-28 DIAGNOSIS — I10 Essential (primary) hypertension: Secondary | ICD-10-CM | POA: Diagnosis not present

## 2020-02-28 DIAGNOSIS — R519 Headache, unspecified: Secondary | ICD-10-CM | POA: Diagnosis not present

## 2020-02-28 DIAGNOSIS — Z79899 Other long term (current) drug therapy: Secondary | ICD-10-CM | POA: Insufficient documentation

## 2020-02-28 DIAGNOSIS — Z87442 Personal history of urinary calculi: Secondary | ICD-10-CM | POA: Diagnosis not present

## 2020-02-28 DIAGNOSIS — Z791 Long term (current) use of non-steroidal anti-inflammatories (NSAID): Secondary | ICD-10-CM | POA: Insufficient documentation

## 2020-02-28 DIAGNOSIS — F419 Anxiety disorder, unspecified: Secondary | ICD-10-CM | POA: Diagnosis not present

## 2020-02-28 DIAGNOSIS — M316 Other giant cell arteritis: Secondary | ICD-10-CM | POA: Diagnosis not present

## 2020-02-28 DIAGNOSIS — Z87891 Personal history of nicotine dependence: Secondary | ICD-10-CM | POA: Insufficient documentation

## 2020-02-28 DIAGNOSIS — G441 Vascular headache, not elsewhere classified: Secondary | ICD-10-CM

## 2020-02-28 DIAGNOSIS — B1920 Unspecified viral hepatitis C without hepatic coma: Secondary | ICD-10-CM | POA: Diagnosis not present

## 2020-02-28 HISTORY — PX: ARTERY BIOPSY: SHX891

## 2020-02-28 LAB — COMPREHENSIVE METABOLIC PANEL
ALT: 27 U/L (ref 0–44)
AST: 32 U/L (ref 15–41)
Albumin: 3.9 g/dL (ref 3.5–5.0)
Alkaline Phosphatase: 51 U/L (ref 38–126)
Anion gap: 10 (ref 5–15)
BUN: 13 mg/dL (ref 6–20)
CO2: 27 mmol/L (ref 22–32)
Calcium: 9.4 mg/dL (ref 8.9–10.3)
Chloride: 100 mmol/L (ref 98–111)
Creatinine, Ser: 1.1 mg/dL (ref 0.61–1.24)
GFR calc Af Amer: >60 mL/min (ref >60)
GFR calc non Af Amer: >60 mL/min (ref >60)
Glucose, Bld: 99 mg/dL (ref 70–99)
Potassium: 3.8 mmol/L (ref 3.5–5.1)
Sodium: 137 mmol/L (ref 135–145)
Total Bilirubin: 0.7 mg/dL (ref 0.3–1.2)
Total Protein: 6.5 g/dL (ref 6.5–8.1)

## 2020-02-28 LAB — HEMOGLOBIN: Hemoglobin: 17.3 g/dL — ABNORMAL HIGH (ref 13.0–17.0)

## 2020-02-28 LAB — SURGICAL PCR SCREEN
MRSA, PCR: NEGATIVE
Staphylococcus aureus: POSITIVE — AB

## 2020-02-28 SURGERY — BIOPSY TEMPORAL ARTERY
Anesthesia: Choice | Laterality: Left

## 2020-02-28 SURGERY — BIOPSY TEMPORAL ARTERY
Anesthesia: General | Site: Head | Laterality: Left

## 2020-02-28 MED ORDER — LIDOCAINE HCL (PF) 1 % IJ SOLN
INTRAMUSCULAR | Status: AC
  Filled 2020-02-28: qty 30

## 2020-02-28 MED ORDER — ONDANSETRON HCL 4 MG/2ML IJ SOLN
INTRAMUSCULAR | Status: DC | PRN
  Administered 2020-02-28: 4 mg via INTRAVENOUS

## 2020-02-28 MED ORDER — MIDAZOLAM HCL 2 MG/2ML IJ SOLN
INTRAMUSCULAR | Status: DC | PRN
  Administered 2020-02-28: 2 mg via INTRAVENOUS

## 2020-02-28 MED ORDER — 0.9 % SODIUM CHLORIDE (POUR BTL) OPTIME
TOPICAL | Status: DC | PRN
  Administered 2020-02-28: 1000 mL

## 2020-02-28 MED ORDER — FENTANYL CITRATE (PF) 100 MCG/2ML IJ SOLN
INTRAMUSCULAR | Status: AC
  Administered 2020-02-28: 50 ug via INTRAVENOUS
  Filled 2020-02-28: qty 2

## 2020-02-28 MED ORDER — FENTANYL CITRATE (PF) 100 MCG/2ML IJ SOLN
50.0000 ug | Freq: Once | INTRAMUSCULAR | Status: AC
  Administered 2020-02-28: 50 ug via INTRAVENOUS

## 2020-02-28 MED ORDER — MUPIROCIN 2 % EX OINT
1.0000 "application " | TOPICAL_OINTMENT | Freq: Once | CUTANEOUS | Status: AC
  Administered 2020-02-28: 1 via TOPICAL
  Filled 2020-02-28: qty 22

## 2020-02-28 MED ORDER — PROPOFOL 10 MG/ML IV BOLUS
INTRAVENOUS | Status: AC
  Filled 2020-02-28: qty 20

## 2020-02-28 MED ORDER — MEPERIDINE HCL 25 MG/ML IJ SOLN: 6.2500 mg | INTRAMUSCULAR | Status: DC | PRN

## 2020-02-28 MED ORDER — CEFAZOLIN SODIUM-DEXTROSE 2-4 GM/100ML-% IV SOLN
2.0000 g | INTRAVENOUS | Status: AC
  Administered 2020-02-28: 2 g via INTRAVENOUS
  Filled 2020-02-28: qty 100

## 2020-02-28 MED ORDER — MIDAZOLAM HCL 2 MG/2ML IJ SOLN: 2.0000 mg | Freq: Once | INTRAMUSCULAR | Status: AC

## 2020-02-28 MED ORDER — PROPOFOL 10 MG/ML IV BOLUS
INTRAVENOUS | Status: DC | PRN
  Administered 2020-02-28: 200 mg via INTRAVENOUS

## 2020-02-28 MED ORDER — DEXAMETHASONE SODIUM PHOSPHATE 10 MG/ML IJ SOLN
INTRAMUSCULAR | Status: AC
  Filled 2020-02-28: qty 1

## 2020-02-28 MED ORDER — MIDAZOLAM HCL 2 MG/2ML IJ SOLN
INTRAMUSCULAR | Status: AC
  Administered 2020-02-28: 2 mg via INTRAVENOUS
  Filled 2020-02-28: qty 2

## 2020-02-28 MED ORDER — SODIUM CHLORIDE 0.9 % IV SOLN: INTRAVENOUS | Status: DC

## 2020-02-28 MED ORDER — CHLORHEXIDINE GLUCONATE 4 % EX LIQD: 60.0000 mL | Freq: Once | CUTANEOUS | Status: DC

## 2020-02-28 MED ORDER — MIDAZOLAM HCL 2 MG/2ML IJ SOLN
INTRAMUSCULAR | Status: AC
  Filled 2020-02-28: qty 2

## 2020-02-28 MED ORDER — LACTATED RINGERS IV SOLN: INTRAVENOUS | Status: DC

## 2020-02-28 MED ORDER — PHENYLEPHRINE HCL (PRESSORS) 10 MG/ML IV SOLN
INTRAVENOUS | Status: DC | PRN
  Administered 2020-02-28: 120 ug via INTRAVENOUS

## 2020-02-28 MED ORDER — FENTANYL CITRATE (PF) 100 MCG/2ML IJ SOLN
INTRAMUSCULAR | Status: DC | PRN
  Administered 2020-02-28 (×2): 50 ug via INTRAVENOUS
  Administered 2020-02-28: 25 ug via INTRAVENOUS
  Administered 2020-02-28: 100 ug via INTRAVENOUS
  Administered 2020-02-28: 25 ug via INTRAVENOUS

## 2020-02-28 MED ORDER — FENTANYL CITRATE (PF) 100 MCG/2ML IJ SOLN: 50.0000 ug | Freq: Once | INTRAMUSCULAR | Status: AC

## 2020-02-28 MED ORDER — ORAL CARE MOUTH RINSE: 15.0000 mL | Freq: Once | OROMUCOSAL | Status: AC

## 2020-02-28 MED ORDER — FENTANYL CITRATE (PF) 100 MCG/2ML IJ SOLN
INTRAMUSCULAR | Status: AC
  Filled 2020-02-28: qty 2

## 2020-02-28 MED ORDER — LIDOCAINE HCL (CARDIAC) PF 100 MG/5ML IV SOSY
PREFILLED_SYRINGE | INTRAVENOUS | Status: DC | PRN
  Administered 2020-02-28: 40 mg via INTRAVENOUS

## 2020-02-28 MED ORDER — CHLORHEXIDINE GLUCONATE 0.12 % MT SOLN
15.0000 mL | Freq: Once | OROMUCOSAL | Status: AC
  Administered 2020-02-28: 15 mL via OROMUCOSAL
  Filled 2020-02-28: qty 15

## 2020-02-28 MED ORDER — FENTANYL CITRATE (PF) 250 MCG/5ML IJ SOLN
INTRAMUSCULAR | Status: AC
  Filled 2020-02-28: qty 5

## 2020-02-28 MED ORDER — DEXMEDETOMIDINE HCL 200 MCG/2ML IV SOLN
INTRAVENOUS | Status: DC | PRN
  Administered 2020-02-28: 4 ug via INTRAVENOUS
  Administered 2020-02-28: 12 ug via INTRAVENOUS
  Administered 2020-02-28: 8 ug via INTRAVENOUS

## 2020-02-28 MED ORDER — ONDANSETRON HCL 4 MG/2ML IJ SOLN
INTRAMUSCULAR | Status: AC
  Filled 2020-02-28: qty 2

## 2020-02-28 MED ORDER — LIDOCAINE HCL (PF) 1 % IJ SOLN
INTRAMUSCULAR | Status: DC | PRN
  Administered 2020-02-28: 6 mL via INTRADERMAL

## 2020-02-28 MED ORDER — FENTANYL CITRATE (PF) 100 MCG/2ML IJ SOLN
25.0000 ug | INTRAMUSCULAR | Status: DC | PRN
  Administered 2020-02-28 (×2): 50 ug via INTRAVENOUS

## 2020-02-28 MED ORDER — PHENYLEPHRINE 40 MCG/ML (10ML) SYRINGE FOR IV PUSH (FOR BLOOD PRESSURE SUPPORT)
PREFILLED_SYRINGE | INTRAVENOUS | Status: AC
  Filled 2020-02-28: qty 10

## 2020-02-28 MED ORDER — DEXAMETHASONE SODIUM PHOSPHATE 10 MG/ML IJ SOLN
INTRAMUSCULAR | Status: DC | PRN
  Administered 2020-02-28: 4 mg via INTRAVENOUS

## 2020-02-28 MED ORDER — PROMETHAZINE HCL 25 MG/ML IJ SOLN: 6.2500 mg | INTRAMUSCULAR | Status: DC | PRN

## 2020-02-28 MED ORDER — HYDROCODONE-ACETAMINOPHEN 5-325 MG PO TABS: 1.0000 | ORAL_TABLET | ORAL | 0 refills | Status: DC | PRN

## 2020-02-28 SURGICAL SUPPLY — 54 items
BLADE SURG 15 STRL LF DISP TIS (BLADE) ×1 IMPLANT
BLADE SURG 15 STRL SS (BLADE) ×3
CANISTER SUCT 3000ML PPV (MISCELLANEOUS) ×3 IMPLANT
CLIP VESOCCLUDE MED 6/CT (CLIP) ×3 IMPLANT
CLIP VESOCCLUDE SM WIDE 6/CT (CLIP) ×3 IMPLANT
CNTNR URN SCR LID CUP LEK RST (MISCELLANEOUS) ×1 IMPLANT
CONT SPEC 4OZ STRL OR WHT (MISCELLANEOUS) ×3
COTTONBALL LRG STERILE PKG (GAUZE/BANDAGES/DRESSINGS) ×3 IMPLANT
COVER SURGICAL LIGHT HANDLE (MISCELLANEOUS) ×3 IMPLANT
COVER WAND RF STERILE (DRAPES) IMPLANT
DECANTER SPIKE VIAL GLASS SM (MISCELLANEOUS) IMPLANT
DERMABOND ADVANCED (GAUZE/BANDAGES/DRESSINGS) ×2
DERMABOND ADVANCED .7 DNX12 (GAUZE/BANDAGES/DRESSINGS) ×1 IMPLANT
DRAPE BRACHIAL (DRAPES) ×3 IMPLANT
DRAPE HALF SHEET 40X57 (DRAPES) ×6 IMPLANT
DRAPE SURG 17X23 STRL (DRAPES) ×18 IMPLANT
DRAPE TABLE BACK 80X90 (DRAPES) ×3 IMPLANT
ELECT CAUTERY BLADE 6.4 (BLADE) ×3 IMPLANT
ELECT REM PT RETURN 9FT ADLT (ELECTROSURGICAL) ×3
ELECTRODE REM PT RTRN 9FT ADLT (ELECTROSURGICAL) ×1 IMPLANT
GLOVE BIOGEL PI IND STRL 6.5 (GLOVE) ×1 IMPLANT
GLOVE BIOGEL PI IND STRL 7.5 (GLOVE) ×1 IMPLANT
GLOVE BIOGEL PI INDICATOR 6.5 (GLOVE) ×2
GLOVE BIOGEL PI INDICATOR 7.5 (GLOVE) ×2
GLOVE INDICATOR 7.0 STRL GRN (GLOVE) ×3 IMPLANT
GLOVE INDICATOR 7.5 STRL GRN (GLOVE) ×3 IMPLANT
GLOVE SS BIOGEL STRL SZ 6.5 (GLOVE) ×1 IMPLANT
GLOVE SUPERSENSE BIOGEL SZ 6.5 (GLOVE) ×2
GLOVE SURG SS PI 7.5 STRL IVOR (GLOVE) ×6 IMPLANT
GOWN STRL REUS W/ TWL LRG LVL3 (GOWN DISPOSABLE) ×1 IMPLANT
GOWN STRL REUS W/ TWL XL LVL3 (GOWN DISPOSABLE) ×3 IMPLANT
GOWN STRL REUS W/TWL LRG LVL3 (GOWN DISPOSABLE) ×3
GOWN STRL REUS W/TWL XL LVL3 (GOWN DISPOSABLE) ×9
KIT BASIN OR (CUSTOM PROCEDURE TRAY) ×3 IMPLANT
KIT TURNOVER KIT B (KITS) ×3 IMPLANT
LOOP VESSEL MINI RED (MISCELLANEOUS) ×3 IMPLANT
NEEDLE HYPO 25GX1X1/2 BEV (NEEDLE) ×3 IMPLANT
NS IRRIG 1000ML POUR BTL (IV SOLUTION) ×3 IMPLANT
PACK GENERAL/GYN (CUSTOM PROCEDURE TRAY) ×3 IMPLANT
PAD ARMBOARD 7.5X6 YLW CONV (MISCELLANEOUS) ×6 IMPLANT
PENCIL BUTTON HOLSTER BLD 10FT (ELECTRODE) ×3 IMPLANT
SPONGE LAP 4X18 RFD (DISPOSABLE) ×3 IMPLANT
SUCTION FRAZIER HANDLE 10FR (MISCELLANEOUS) ×3
SUCTION TUBE FRAZIER 10FR DISP (MISCELLANEOUS) ×1 IMPLANT
SUT PROLENE 6 0 BV (SUTURE) IMPLANT
SUT SILK 2 0 TIES 10X30 (SUTURE) ×3 IMPLANT
SUT SILK 3 0 (SUTURE) ×3
SUT SILK 3-0 18XBRD TIE 12 (SUTURE) ×1 IMPLANT
SUT VIC AB 3-0 SH 27 (SUTURE) ×3
SUT VIC AB 3-0 SH 27X BRD (SUTURE) ×1 IMPLANT
SUT VICRYL 4-0 PS2 18IN ABS (SUTURE) ×3 IMPLANT
SYR CONTROL 10ML LL (SYRINGE) ×3 IMPLANT
TOWEL GREEN STERILE (TOWEL DISPOSABLE) ×3 IMPLANT
WATER STERILE IRR 1000ML POUR (IV SOLUTION) ×3 IMPLANT

## 2020-02-28 NOTE — Interval H&P Note (Signed)
History and Physical Interval Note:  02/28/2020 2:34 PM  Corey Gilbert  has presented today for surgery, with the diagnosis of [id number].  The various methods of treatment have been discussed with the patient and family. After consideration of risks, benefits and other options for treatment, the patient has consented to  Procedure(s): BIOPSY TEMPORAL ARTERY (Left) as a surgical intervention.  The patient's history has been reviewed, patient examined, no change in status, stable for surgery.  I have reviewed the patient's chart and labs.  Questions were answered to the patient's satisfaction.     Durene Cal

## 2020-02-28 NOTE — Discharge Instructions (Signed)
Incision Care, Adult An incision is a cut that a doctor makes in your skin for surgery (for a procedure). Most times, these cuts are closed after surgery. Your cut from surgery may be closed with stitches (sutures), staples, skin glue, or skin tape (adhesive strips). You may need to return to your doctor to have stitches or staples taken out. This may happen many days or many weeks after your surgery. The cut needs to be well cared for so it does not get infected. How to care for your cut Cut care   Follow instructions from your doctor about how to take care of your cut. Make sure you: ? Wash your hands with soap and water before you change your bandage (dressing). If you cannot use soap and water, use hand sanitizer. ? Change your bandage as told by your doctor. ? Leave stitches, skin glue, or skin tape in place. They may need to stay in place for 2 weeks or longer. If tape strips get loose and curl up, you may trim the loose edges. Do not remove tape strips completely unless your doctor says it is okay.  Check your cut area every day for signs of infection. Check for: ? More redness, swelling, or pain. ? More fluid or blood. ? Warmth. ? Pus or a bad smell.  Ask your doctor how to clean the cut. This may include: ? Using mild soap and water. ? Using a clean towel to pat the cut dry after you clean it. ? Putting a cream or ointment on the cut. Do this only as told by your doctor. ? Covering the cut with a clean bandage.  Ask your doctor when you can leave the cut uncovered.  Do not take baths, swim, or use a hot tub until your doctor says it is okay. Ask your doctor if you can take showers. You may only be allowed to take sponge baths for bathing. Medicines  If you were prescribed an antibiotic medicine, cream, or ointment, take the antibiotic or put it on the cut as told by your doctor. Do not stop taking or putting on the antibiotic even if your condition gets better.  Take  over-the-counter and prescription medicines only as told by your doctor. General instructions  Limit movement around your cut. This helps healing. ? Avoid straining, lifting, or exercise for the first month, or for as long as told by your doctor. ? Follow instructions from your doctor about going back to your normal activities. ? Ask your doctor what activities are safe.  Protect your cut from the sun when you are outside for the first 6 months, or for as long as told by your doctor. Put on sunscreen around the scar or cover up the scar.  Keep all follow-up visits as told by your doctor. This is important. Contact a doctor if:  Your have more redness, swelling, or pain around the cut.  You have more fluid or blood coming from the cut.  Your cut feels warm to the touch.  You have pus or a bad smell coming from the cut.  You have a fever or shaking chills.  You feel sick to your stomach (nauseous) or you throw up (vomit).  You are dizzy.  Your stitches or staples come undone. Get help right away if:  You have a red streak coming from your cut.  Your cut bleeds through the bandage and the bleeding does not stop with gentle pressure.  The edges of your cut   open up and separate.  You have very bad (severe) pain.  You have a rash.  You are confused.  You pass out (faint).  You have trouble breathing and you have a fast heartbeat. This information is not intended to replace advice given to you by your health care provider. Make sure you discuss any questions you have with your health care provider. Document Revised: 01/25/2017 Document Reviewed: 05/15/2016 Elsevier Patient Education  2020 [employer] Inc[employer]  

## 2020-02-28 NOTE — Progress Notes (Signed)
Pt notified that his surgery start time is delayed. Pt is extremely anxious and requesting something to calm him.  Notified Dr. Renold Don, orders received. Wife brought back to preop room per pt request.

## 2020-02-28 NOTE — Anesthesia Procedure Notes (Signed)
Procedure Name: LMA Insertion Date/Time: 02/28/2020 3:11 PM Performed by: Epifanio Lesches, CRNA Pre-anesthesia Checklist: Patient identified, Emergency Drugs available, Suction available and Patient being monitored Patient Re-evaluated:Patient Re-evaluated prior to induction Oxygen Delivery Method: Circle System Utilized Preoxygenation: Pre-oxygenation with 100% oxygen Induction Type: IV induction Ventilation: Mask ventilation without difficulty LMA: LMA with gastric port inserted LMA Size: 5.0 Number of attempts: 1 Airway Equipment and Method: Bite block Placement Confirmation: positive ETCO2 Tube secured with: Tape Dental Injury: Teeth and Oropharynx as per pre-operative assessment

## 2020-02-28 NOTE — Transfer of Care (Signed)
Immediate Anesthesia Transfer of Care Note  Patient: Corey Gilbert  Procedure(s) Performed: BIOPSY TEMPORAL ARTERY (Left Head)  Patient Location: PACU  Anesthesia Type:General  Level of Consciousness: awake and drowsy  Airway & Oxygen Therapy: Patient Spontanous Breathing and Patient connected to face mask oxygen  Post-op Assessment: Report given to RN and Post -op Vital signs reviewed and stable  Post vital signs: Reviewed and stable  Last Vitals:  Vitals Value Taken Time  BP 118/80 02/28/20 1648  Temp    Pulse 89 02/28/20 1654  Resp 10 02/28/20 1654  SpO2 92 % 02/28/20 1654  Vitals shown include unvalidated device data.  Last Pain:  Vitals:   02/28/20 1645  PainSc: (P) Asleep      Patients Stated Pain Goal: 6 (02/28/20 8309)  Complications: No apparent anesthesia complications

## 2020-02-28 NOTE — Op Note (Signed)
° ° °  Patient name: Corey Gilbert MRN: [id number] DOB: 1966/04/22 Sex: male  02/28/2020 Pre-operative Diagnosis: Temporal arteritis Post-operative diagnosis:  Same Surgeon:  Durene Cal Assistants: Wendi Maya Procedure:   Resection of left temporal artery Anesthesia: General Blood Loss: Minimal Specimens: Left temporal artery to pathology  Findings: Tortuous and redundant temporal artery associated with surrounding inflammation  Indications: The patient has a history of temporal arteritis, documented by right temporal artery biopsy.  He is not able to to tolerate steroids because of his bipolar disorder.  He states that following temporal artery resection on the right, his symptoms dissipated.  He is now having symptoms of pain on the left side over his temporal artery and desires resection.  I was very clear with him that I am unsure if this will alleviate his symptoms but he wished to proceed.  Procedure:  The patient was identified in the holding area and taken to [facility] OR ROOM 15  The patient was then placed supine on the table. general anesthesia was administered.  The patient was prepped and draped in the usual sterile fashion.  A time out was called and antibiotics were administered.  The left temporal artery was easily visualized as it was very prominent and redundant.  I made an incision in the left hairline near the temporal artery.  I created a skin flap inferiorly and identified the temporal artery.  This was a generous 2 mm artery that was extremely redundant and folded back on itself.  I dissected out the artery within the extent of the incision.  Side branches were ligated with silk ties.  The artery was ligated proximally and distally and it was sent to pathology intact as a specimen.  Once this was done meticulous hemostasis was achieved.  I then reapproximated the fascia with 3-0 Vicryl and the skin was closed with 4-0 Vicryl.  Dermabond was applied.  There were no immediate  complications.   Disposition: To PACU stable.   Juleen China, M.D., [facility] [place] Office: [phone] Pager:  [phone]

## 2020-02-28 NOTE — Anesthesia Preprocedure Evaluation (Addendum)
Anesthesia Evaluation  Patient identified by MRN, date of birth, ID band Patient awake    Reviewed: Allergy & Precautions, NPO status , Patient's Chart, lab work & pertinent test results  History of Anesthesia Complications (+) PONV, Family history of anesthesia reaction and history of anesthetic complications  Airway Mallampati: II  TM Distance: >3 FB Neck ROM: Limited    Dental  (+) Teeth Intact, Dental Advisory Given,    Pulmonary Patient abstained from smoking., former smoker,    Pulmonary exam normal        Cardiovascular hypertension, Pt. on medications Normal cardiovascular exam+ dysrhythmias  Rhythm:Regular     Neuro/Psych  Headaches, PSYCHIATRIC DISORDERS Anxiety Depression Bipolar Disorder    GI/Hepatic (+) Hepatitis -, C  Endo/Other    Renal/GU Renal disease     Musculoskeletal  (+) Arthritis ,   Abdominal   Peds  Hematology   Anesthesia Other Findings   Reproductive/Obstetrics                             Anesthesia Physical  Anesthesia Plan  ASA: III  Anesthesia Plan: General   Post-op Pain Management:    Induction: Intravenous  PONV Risk Score and Plan: 3 and Ondansetron, Dexamethasone, Midazolam and Treatment may vary due to age or medical condition  Airway Management Planned: LMA  Additional Equipment: None  Intra-op Plan:   Post-operative Plan: Extubation in OR  Informed Consent: I have reviewed the patients History and Physical, chart, labs and discussed the procedure including the risks, benefits and alternatives for the proposed anesthesia with the patient or authorized representative who has indicated his/her understanding and acceptance.     Dental advisory given  Plan Discussed with: CRNA  Anesthesia Plan Comments: (BP in preop 160s/110s. Very anxious. Did not take HCTZ or lisinopril today. )      Anesthesia Quick Evaluation

## 2020-02-28 NOTE — Anesthesia Postprocedure Evaluation (Signed)
Anesthesia Post Note  Patient: Corey Gilbert  Procedure(s) Performed: BIOPSY TEMPORAL ARTERY (Left Head)     Patient location during evaluation: PACU Anesthesia Type: General Level of consciousness: awake and alert Pain management: pain level controlled Vital Signs Assessment: post-procedure vital signs reviewed and stable Respiratory status: spontaneous breathing, nonlabored ventilation, respiratory function stable and patient connected to nasal cannula oxygen Cardiovascular status: blood pressure returned to baseline and stable Postop Assessment: no apparent nausea or vomiting Anesthetic complications: no    Last Vitals:  Vitals:   02/28/20 1700 02/28/20 1715  BP: 130/90 (!) 145/94  Pulse: 87 92  Resp: 10 14  Temp:  36.7 C  SpO2: 92% 90%                  Shelton Silvas

## 2020-02-29 ENCOUNTER — Other Ambulatory Visit: Payer: Self-pay | Admitting: Physician Assistant

## 2020-02-29 ENCOUNTER — Telehealth: Payer: Self-pay

## 2020-02-29 ENCOUNTER — Encounter (HOSPITAL_COMMUNITY): Payer: Self-pay | Admitting: Surgery

## 2020-02-29 LAB — SURGICAL PATHOLOGY

## 2020-02-29 MED ORDER — TRAMADOL HCL 50 MG PO TABS: 50.0000 mg | ORAL_TABLET | Freq: Four times a day (QID) | ORAL | 0 refills | Status: DC | PRN

## 2020-02-29 NOTE — Telephone Encounter (Signed)
Pt's wife Nehemiah Settle called c/o pt being unable to take Percocet due to it "hurting his stomach". I called pt and he states he is unable to take it due to his history of Hep C. PA has been made aware and called in Tramadol for him. I have made pt aware he has no further questions/concerns at this time.

## 2020-02-29 NOTE — Telephone Encounter (Signed)
Pt called back after PA called in Tramadol, left message stating the Tramadol that was called in "is not the right medication". I have returned his call and left a VM to call us back if he still needs [place]. I have made PA aware.

## 2020-03-04 ENCOUNTER — Telehealth: Payer: Self-pay

## 2020-03-04 NOTE — Telephone Encounter (Signed)
Pts wife called stating pt has a 1/4 inch opening at Lt temporal incision she thinks occurred from pt rubbing forehead. She denies any s/o infection when asked. She has applied neosporin and a butterfly bandage to area. Advised to keep area clean, pat dry, may apply the butterfly bandage and contact office if develop any s/o infection. Voiced understanding.   Discussed with Dr. Myra Gianotti. No additional recommendations provided.

## 2020-03-05 ENCOUNTER — Telehealth: Payer: Self-pay

## 2020-03-05 NOTE — Telephone Encounter (Signed)
Triage call received from pt/wife. Reports pain at Lt temporal incision mainly at night or in the morning if turn on Lt side when sleeping. Wife requested refill on pain medications. Pt given Rx Tramadol 50 mg 1 tab every 6 hrs PRN #6 on 6/10. States he has also tried ice pack, Ibuprofen and tries to lay on Rt side or back but turns over in sleep. Pt became frustrated during call and hung up prior to chance of offering appt with PA on tomorrow at 0915 to assess wound and address pain concerns. Attempted to reach pt immediately back, no answer. LMOM to return call, with attempt to schedule pt for OV.

## 2020-03-18 ENCOUNTER — Other Ambulatory Visit: Payer: Self-pay

## 2020-03-18 ENCOUNTER — Ambulatory Visit (INDEPENDENT_AMBULATORY_CARE_PROVIDER_SITE_OTHER): Payer: Medicaid Other | Admitting: Physician Assistant

## 2020-03-18 DIAGNOSIS — T8189XS Other complications of procedures, not elsewhere classified, sequela: Secondary | ICD-10-CM

## 2020-03-18 DIAGNOSIS — T8189XA Other complications of procedures, not elsewhere classified, initial encounter: Secondary | ICD-10-CM | POA: Insufficient documentation

## 2020-03-18 NOTE — Progress Notes (Signed)
    Postoperative Visit   History of Present Illness   Corey Gilbert is a 54 y.o. year old male who presents for postoperative follow-up for slow to heal L temporal incision s/p temporal artery biopsy by Dr. Myra Gianotti 02/28/20.  He developed ecchymosis of left eye following surgery and some incision dehiscence.  Patient believes incision is healing now.  He no longer has headaches.  Biopsy was negative for giant cell arteritis.    Physical Examination   Vitals:   03/18/20 1306  BP: (!) 142/96  Pulse: (!) 101  Resp: 20  Temp: 98.5 F (36.9 C)  TempSrc: Temporal  SpO2: 94%  Weight: 195 lb 3.2 oz (88.5 kg)  Height: 5\' 10"  (1.778 m)   Body mass index is 28.01 kg/m.  left temporal Incision is healing, small fluid collection, no drainage or surrounding erythema    Medical Decision Making   Corey Gilbert is a 54 y.o. year old male who presents s/p left temproal artery biopsy   Incision now healing well  Small fluid collection but assured patient that this will resolve   Follow up as needed   40 PA-C [facility] Office: [phone]  Clinic MD: [phone]

## 2021-03-10 DIAGNOSIS — R000 Tachycardia, unspecified: Secondary | ICD-10-CM

## 2021-04-08 ENCOUNTER — Encounter: Payer: Self-pay | Admitting: Internal Medicine

## 2021-04-08 ENCOUNTER — Other Ambulatory Visit: Payer: Self-pay

## 2021-04-08 ENCOUNTER — Ambulatory Visit: Payer: Medicaid Other | Admitting: Internal Medicine

## 2021-04-08 DIAGNOSIS — G061 Intraspinal abscess and granuloma: Secondary | ICD-10-CM

## 2021-04-08 DIAGNOSIS — B9689 Other specified bacterial agents as the cause of diseases classified elsewhere: Secondary | ICD-10-CM | POA: Diagnosis not present

## 2021-04-08 NOTE — Progress Notes (Signed)
[facility]      Reason for Consult: epidural abscess    Referring Physician: [facility]    Patient ID: Corey Gilbert, male    DOB: 25-Dec-1965, 55 y.o.   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   He was admitted to [facility] in June with acute on chronic back pain and MRI c/w above.  He underwent an IR guided biopsy and culture positive for Strep mitis, result not available to me.  His treatment is complicated by the positive UDS for cocaine and so he was treated with once daily ceftriaxone at the infusion center/short stay at [facility] and no picc line.  Ceftriaxone is planned through August 3rd.  He is here with his wife.  They report no labs being drawn.  His main complaint is the pain of his back.  He equates his pain as a side effect of the ceftriaxone.  He reports he read side effects of ceftriaxone being back pain and feels his back pain worsens 2 hours after he gets the infusion.  He is asking about pain medications and if there is any other treatment that can facilitate improvement with the infection.  Asking if I have seen this type of infection previously.  He reports some weakness, particularly of his left leg and some bladder incontinence.  He is following up with his neurosurgeon today.  He is having no diarrhea, no fever.     Past Medical History:  Diagnosis Date   Anxiety    Arthritis    Bipolar 1 disorder (HCC)    Bipolar disorder (HCC)    Brain injuries (HCC)    Complication of anesthesia    Concussion    Depression    Family history of anesthesia complication    pon& v   Headache    Hepatitis    Hepatitis C - has been treated with Harvoni   History of kidney stones    Hypertension    Near syncope    due to opain, last time  early Sept 2013   PONV (postoperative nausea and vomiting)    not on most recent surgery    Prior  to Admission medications   Medication Sig Start Date End Date Taking? Authorizing Provider  aspirin-acetaminophen-caffeine (EXCEDRIN MIGRAINE) [phone] MG per tablet Take 1 tablet by mouth every 6 (six) hours as needed for migraine.     [provider]  cyclobenzaprine (FLEXERIL) 10 MG tablet Take 10 mg by mouth at bedtime as needed (pain).  01/28/15   [provider]  ibuprofen (ADVIL) 200 MG tablet Take 400-800 mg by mouth every 8 (eight) hours as needed for moderate pain.    [provider]  lisinopril-hydrochlorothiazide (PRINZIDE,ZESTORETIC) 20-25 MG tablet Take 1 tablet by mouth daily. 09/15/18   [provider]    Allergies  Allergen Reactions   Lyrica [Pregabalin] Other (See Comments)    Mania   Neurontin [Gabapentin] Other (See Comments)    Goes manic   Toradol [Ketorolac Tromethamine] Other (See Comments)    Reacts with other medications; also, fever and mania   Tylenol [Acetaminophen] Other (See Comments)    Avoids due to history of Hepatitis C    Social History   Tobacco Use   Smoking status: Former   Smokeless tobacco: Never  Vaping Use   Vaping Use: Never used  Substance  Use Topics   Alcohol use: No    Alcohol/week: 0.0 standard drinks    Comment: rarely   Drug use: Yes    Types: Marijuana, Oxycodone    Comment: uses marijuana to help bipolar    Family History  Problem Relation Age of Onset   Arrhythmia Mother     Review of Systems  Constitutional: negative for fevers and chills Gastrointestinal: negative for nausea and diarrhea Integument/breast: negative for rash Neurological: positive for paresthesia, gait problems, and weakness, negative for headaches All other systems reviewed and are negative    Constitutional: in no apparent distress There were no vitals filed for this visit. EYES: anicteric Cardiovascular: Cor RRR Respiratory: clear Musculoskeletal: no pedal edema noted Skin: negatives: no  rash  Labs: Lab Results  Component Value Date   WBC 6.2 11/24/2018   HGB 17.3 (H) 02/28/2020   HCT 45.7 11/24/2018   MCV 89.1 11/24/2018   PLT 145 (L) 11/24/2018    Lab Results  Component Value Date   CREATININE 1.10 02/28/2020   BUN 13 02/28/2020   NA 137 02/28/2020   K 3.8 02/28/2020   CL 100 02/28/2020   CO2 27 02/28/2020    Lab Results  Component Value Date   ALT 27 02/28/2020   AST 32 02/28/2020   ALKPHOS 51 02/28/2020   BILITOT 0.7 02/28/2020   INR 1.17 03/26/2015     Assessment: lumbar epidural abscess with discitis, 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  Strep mitis in culture and on appropriate antibiotics with ceftriaxone daily at the local infusion center through [facility].  He already had an early repeat MRI during an ED visit for ongoing significant pain and was stable at that time.  I had a long discussion with him and his wife that the pain he is experiencing is from the epidural abscess and I do not believe it is from the ceftriaxone.  He will need to continue with this to give him the best chance of cure.  Additionally, I am concerned with his neurologic symptoms and will defer to his neurosurgeon if any surgical intervention indicated at this time.    Plan: 1)  continue ceftriaxone through August 3rd 2) labs today including inflammatory markers 3) will consider oral amoxicillin after completion of his ceftriaxone 4) will repeat an MRi after August 3rd since it is an abscess, unless done by neurosurgery  Follow up prior to August 3rd via video visit

## 2021-04-09 LAB — CBC WITH DIFFERENTIAL/PLATELET
Absolute Monocytes: 608 cells/uL (ref 200–950)
Basophils Absolute: 70 cells/uL (ref 0–200)
Basophils Relative: 1.1 %
Eosinophils Absolute: 141 cells/uL (ref 15–500)
Eosinophils Relative: 2.2 %
HCT: 41.5 % (ref 38.5–50.0)
Hemoglobin: 14 g/dL (ref 13.2–17.1)
Lymphs Abs: 1146 cells/uL (ref 850–3900)
MCH: 29.7 pg (ref 27.0–33.0)
MCHC: 33.7 g/dL (ref 32.0–36.0)
MCV: 87.9 fL (ref 80.0–100.0)
MPV: 11.6 fL (ref 7.5–12.5)
Monocytes Relative: 9.5 %
Neutro Abs: 4435 cells/uL (ref 1500–7800)
Neutrophils Relative %: 69.3 %
Platelets: 284 10*3/uL (ref 140–400)
RBC: 4.72 10*6/uL (ref 4.20–5.80)
RDW: 13.1 % (ref 11.0–15.0)
Total Lymphocyte: 17.9 %
WBC: 6.4 10*3/uL (ref 3.8–10.8)

## 2021-04-09 LAB — COMPREHENSIVE METABOLIC PANEL
AG Ratio: 1.4 (calc) (ref 1.0–2.5)
ALT: 16 U/L (ref 9–46)
AST: 16 U/L (ref 10–35)
Albumin: 4.5 g/dL (ref 3.6–5.1)
Alkaline phosphatase (APISO): 69 U/L (ref 35–144)
BUN: 16 mg/dL (ref 7–25)
CO2: 32 mmol/L (ref 20–32)
Calcium: 10.3 mg/dL (ref 8.6–10.3)
Chloride: 97 mmol/L — ABNORMAL LOW (ref 98–110)
Creat: 1.01 mg/dL (ref 0.70–1.30)
Globulin: 3.3 g/dL (calc) (ref 1.9–3.7)
Glucose, Bld: 102 mg/dL — ABNORMAL HIGH (ref 65–99)
Potassium: 4.6 mmol/L (ref 3.5–5.3)
Sodium: 136 mmol/L (ref 135–146)
Total Bilirubin: 0.4 mg/dL (ref 0.2–1.2)
Total Protein: 7.8 g/dL (ref 6.1–8.1)

## 2021-04-09 LAB — SEDIMENTATION RATE: Sed Rate: 41 mm/h — ABNORMAL HIGH (ref 0–20)

## 2021-04-09 LAB — C-REACTIVE PROTEIN: CRP: 8 mg/L — ABNORMAL HIGH (ref <8.0)

## 2021-04-23 ENCOUNTER — Other Ambulatory Visit: Payer: Self-pay

## 2021-04-23 ENCOUNTER — Encounter: Payer: Self-pay | Admitting: Internal Medicine

## 2021-04-23 ENCOUNTER — Telehealth (INDEPENDENT_AMBULATORY_CARE_PROVIDER_SITE_OTHER): Payer: Medicaid Other | Admitting: Internal Medicine

## 2021-04-23 DIAGNOSIS — B9689 Other specified bacterial agents as the cause of diseases classified elsewhere: Secondary | ICD-10-CM

## 2021-04-23 DIAGNOSIS — G061 Intraspinal abscess and granuloma: Secondary | ICD-10-CM

## 2021-04-23 MED ORDER — AMOXICILLIN 500 MG PO CAPS: 1000.0000 mg | ORAL_CAPSULE | Freq: Three times a day (TID) | ORAL | 0 refills | Status: DC

## 2021-04-23 NOTE — Assessment & Plan Note (Signed)
Clinically he seems to finally be improving with better movement despite MRI findings.  I am going to transition him to oral amoxicillin for 1 month and repeat the MRI in about 4 weeks and see if there is improvement.   He will follow up after MRI and can discuss if he needs to continue with antibiotics further.   rtc 4-5 weeks, after MRI

## 2021-04-23 NOTE — Progress Notes (Signed)
   Subjective:    Patient ID: Corey Gilbert, male    DOB: 17-Apr-1966, 55 y.o.   MRN: [id number]  HPI  I connected with  Kevis Qu on 04/23/21 by telephone and verified that I am speaking with the correct person using two identifiers.   I discussed the limitations of evaluation and management by telemedicine. The patient expressed understanding and agreed to proceed.  Location: Patient - home Physician - clinic  Duration of visit:  15 minutes  Here for follow up of discitis/osteomyelitis with epidural abscess. He was diagnosed at [facility] with MRI findings of osteomyelitis and epidural abscess and culture with Strep mitis.  I saw him as a new patient last time and continued with ceftriaxone.  Since then he has been hospitalized with pain and MRI persistent epidural abscess.  He reports feeling better now with improved pain due to stretching exercises.  He is in pain management. He saw a neurosurgeon who recommended continued medical treatment.  His last day for ceftriaxone is scheduled for today.  He has had improved leg movement.    Review of Systems  Constitutional:  Negative for chills and fever.  Gastrointestinal:  Negative for diarrhea and nausea.  Skin:  Negative for rash.      Objective:   Physical Exam Neurological:     Mental Status: He is alert.   SH: no tobacco       Assessment & Plan:

## 2021-05-05 ENCOUNTER — Other Ambulatory Visit: Payer: Self-pay

## 2021-05-05 ENCOUNTER — Ambulatory Visit (HOSPITAL_COMMUNITY)
Admission: RE | Admit: 2021-05-05 | Discharge: 2021-05-05 | Disposition: A | Discharge status: Home / Self Care | Payer: Medicaid Other | Source: Ambulatory Visit | Attending: Internal Medicine | Admitting: Internal Medicine

## 2021-05-05 DIAGNOSIS — B9689 Other specified bacterial agents as the cause of diseases classified elsewhere: Secondary | ICD-10-CM | POA: Insufficient documentation

## 2021-05-05 DIAGNOSIS — G061 Intraspinal abscess and granuloma: Secondary | ICD-10-CM | POA: Diagnosis present

## 2021-05-05 MED ORDER — GADOBUTROL 1 MMOL/ML IV SOLN
8.0000 mL | Freq: Once | INTRAVENOUS | Status: AC | PRN
  Administered 2021-05-05: 8 mL via INTRAVENOUS

## 2021-05-06 ENCOUNTER — Other Ambulatory Visit: Payer: Self-pay | Admitting: Internal Medicine

## 2021-05-06 ENCOUNTER — Telehealth: Payer: Self-pay

## 2021-05-06 MED ORDER — AMOXICILLIN 500 MG PO CAPS: 1000.0000 mg | ORAL_CAPSULE | Freq: Three times a day (TID) | ORAL | 0 refills | Status: AC

## 2021-05-06 NOTE — Telephone Encounter (Signed)
-----   Message from Gardiner Barefoot, MD sent at 05/06/2021 10:52 AM EDT ----- Please let him know that his MRI is finally showing some progress/improvement and seems to be going the right way.  I did refill his amoxicillin though and should take another month when he runs out in early September.  I also see he sees ID at [facility].  No need to see [place] both; if he wants to continue to just follow with them, that is ok with me, whatever he prefers.  thanks

## 2021-05-06 NOTE — Telephone Encounter (Signed)
Spoke with patient's wife, advised her that MRI is showing some improvement, but that Dr. Luciana Axe would like him to continue with amoxicillin for another month when he runs out in early September. She verbalized understanding and has no further questions.   Brook states she will speak to the patient about which ID office he would like to continue following with.   Sandie Ano, RN

## 2021-05-28 ENCOUNTER — Encounter: Payer: Self-pay | Admitting: Internal Medicine

## 2021-05-28 ENCOUNTER — Other Ambulatory Visit: Payer: Self-pay

## 2021-05-28 ENCOUNTER — Ambulatory Visit (INDEPENDENT_AMBULATORY_CARE_PROVIDER_SITE_OTHER): Payer: Medicaid Other | Admitting: Internal Medicine

## 2021-05-28 DIAGNOSIS — G061 Intraspinal abscess and granuloma: Secondary | ICD-10-CM

## 2021-05-28 DIAGNOSIS — B9689 Other specified bacterial agents as the cause of diseases classified elsewhere: Secondary | ICD-10-CM | POA: Diagnosis not present

## 2021-05-28 NOTE — Progress Notes (Signed)
   Subjective:    Patient ID: Corey Gilbert, male    DOB: [birth year], 55 y.o.   MRN: [id number]  HPI  I connected with  Kyro Joswick on 05/28/21 by telephone and verified that I am speaking with the correct person using two identifiers.   I discussed the limitations of evaluation and management by telemedicine. The patient expressed understanding and agreed to proceed.  Location: Patient - [place] residence Physician - clinic  Duration of visit: 17 minutes  Here for follow up of discitis/osteomyelitis with epidural abscess. He was diagnosed at [facility] with MRI findings of osteomyelitis and epidural abscess and culture with Strep mitis.  He completed 6 weeks of IV ceftriaxone and transitioned to high dose amoxicillin.  He continues to do well,feeling improved, no significant pain.  MRI repeated and shows improvement, still with some psoas inflammation.     Review of Systems  Constitutional:  Negative for fever.  Gastrointestinal:  Negative for diarrhea.  Skin:  Negative for rash.      Objective:   Physical Exam Neurological:     Mental Status: He is alert.  Psychiatric:        Mood and Affect: Mood normal.   SH: no tobacco       Assessment & Plan:

## 2021-05-28 NOTE — Assessment & Plan Note (Signed)
Clinically he is doing better and MRI show some improvement, but still with some residual inflammation.  I will have him continue with the amoxicillin through the end of September and repeat his ESR and CRP toward the end of September.  If that is reassuring, will have him stop the amoxicillin at that time.  Once I have the results, will decide if any further treatment indicated. He otherwise will return as needed.
